# Patient Record
Sex: Female | Born: 1975 | Race: White | Hispanic: No | Marital: Married | Smoking: Former smoker
Health system: Southern US, Community
[De-identification: ages and names within clinical notes are randomized; demographics above are authoritative.]

## PROBLEM LIST (undated history)

## (undated) DIAGNOSIS — Z8489 Family history of other specified conditions: Secondary | ICD-10-CM

## (undated) DIAGNOSIS — D6851 Activated protein C resistance: Secondary | ICD-10-CM

## (undated) DIAGNOSIS — N83209 Unspecified ovarian cyst, unspecified side: Secondary | ICD-10-CM

## (undated) DIAGNOSIS — E785 Hyperlipidemia, unspecified: Secondary | ICD-10-CM

## (undated) DIAGNOSIS — K219 Gastro-esophageal reflux disease without esophagitis: Secondary | ICD-10-CM

## (undated) DIAGNOSIS — R112 Nausea with vomiting, unspecified: Secondary | ICD-10-CM

## (undated) DIAGNOSIS — J45909 Unspecified asthma, uncomplicated: Secondary | ICD-10-CM

## (undated) DIAGNOSIS — D689 Coagulation defect, unspecified: Secondary | ICD-10-CM

## (undated) DIAGNOSIS — D759 Disease of blood and blood-forming organs, unspecified: Secondary | ICD-10-CM

## (undated) DIAGNOSIS — J309 Allergic rhinitis, unspecified: Secondary | ICD-10-CM

## (undated) DIAGNOSIS — Z9889 Other specified postprocedural states: Secondary | ICD-10-CM

## (undated) HISTORY — DX: Gastro-esophageal reflux disease without esophagitis: K21.9

## (undated) HISTORY — DX: Unspecified ovarian cyst, unspecified side: N83.209

## (undated) HISTORY — DX: Hyperlipidemia, unspecified: E78.5

## (undated) HISTORY — PX: TONSILLECTOMY AND ADENOIDECTOMY: SUR1326

## (undated) HISTORY — DX: Activated protein C resistance: D68.51

## (undated) HISTORY — DX: Coagulation defect, unspecified: D68.9

## (undated) HISTORY — PX: WISDOM TOOTH EXTRACTION: SHX21

## (undated) HISTORY — PX: ABDOMINAL HYSTERECTOMY: SHX81

## (undated) HISTORY — DX: Allergic rhinitis, unspecified: J30.9

## (undated) HISTORY — DX: Unspecified asthma, uncomplicated: J45.909

---

## 2001-07-04 ENCOUNTER — Other Ambulatory Visit: Admission: RE | Admit: 2001-07-04 | Discharge: 2001-07-04 | Payer: Self-pay | Admitting: Obstetrics and Gynecology

## 2002-06-02 ENCOUNTER — Other Ambulatory Visit: Admission: RE | Admit: 2002-06-02 | Discharge: 2002-06-02 | Payer: Self-pay | Admitting: Obstetrics and Gynecology

## 2004-08-18 ENCOUNTER — Inpatient Hospital Stay (HOSPITAL_COMMUNITY): Admission: AD | Admit: 2004-08-18 | Discharge: 2004-08-18 | Payer: Self-pay | Admitting: Family Medicine

## 2004-08-29 ENCOUNTER — Other Ambulatory Visit: Admission: RE | Admit: 2004-08-29 | Discharge: 2004-08-29 | Payer: Self-pay | Admitting: Obstetrics and Gynecology

## 2005-06-03 ENCOUNTER — Ambulatory Visit: Payer: Self-pay | Admitting: Internal Medicine

## 2005-06-05 ENCOUNTER — Ambulatory Visit (HOSPITAL_COMMUNITY): Admission: RE | Admit: 2005-06-05 | Discharge: 2005-06-05 | Payer: Self-pay | Admitting: Internal Medicine

## 2005-10-13 ENCOUNTER — Ambulatory Visit: Payer: Self-pay | Admitting: Internal Medicine

## 2005-11-07 ENCOUNTER — Ambulatory Visit: Payer: Self-pay | Admitting: Internal Medicine

## 2006-08-30 ENCOUNTER — Ambulatory Visit: Payer: Self-pay | Admitting: Internal Medicine

## 2006-11-10 ENCOUNTER — Ambulatory Visit: Payer: Self-pay | Admitting: Internal Medicine

## 2007-10-26 ENCOUNTER — Ambulatory Visit: Payer: Self-pay | Admitting: Internal Medicine

## 2007-10-26 ENCOUNTER — Encounter (INDEPENDENT_AMBULATORY_CARE_PROVIDER_SITE_OTHER): Payer: Self-pay | Admitting: *Deleted

## 2007-10-26 DIAGNOSIS — J309 Allergic rhinitis, unspecified: Secondary | ICD-10-CM

## 2007-10-26 HISTORY — DX: Allergic rhinitis, unspecified: J30.9

## 2008-04-09 ENCOUNTER — Inpatient Hospital Stay (HOSPITAL_COMMUNITY): Admission: AD | Admit: 2008-04-09 | Discharge: 2008-04-09 | Payer: Self-pay | Admitting: Obstetrics and Gynecology

## 2008-04-11 ENCOUNTER — Observation Stay (HOSPITAL_COMMUNITY): Admission: AD | Admit: 2008-04-11 | Discharge: 2008-04-12 | Payer: Self-pay | Admitting: Obstetrics & Gynecology

## 2008-10-29 ENCOUNTER — Inpatient Hospital Stay (HOSPITAL_COMMUNITY): Admission: RE | Admit: 2008-10-29 | Discharge: 2008-10-31 | Payer: Self-pay | Admitting: Obstetrics & Gynecology

## 2009-06-24 ENCOUNTER — Ambulatory Visit: Payer: Self-pay | Admitting: Internal Medicine

## 2009-06-24 DIAGNOSIS — R5383 Other fatigue: Secondary | ICD-10-CM

## 2009-06-24 DIAGNOSIS — M79609 Pain in unspecified limb: Secondary | ICD-10-CM

## 2009-06-24 DIAGNOSIS — R5381 Other malaise: Secondary | ICD-10-CM | POA: Insufficient documentation

## 2009-06-24 DIAGNOSIS — R32 Unspecified urinary incontinence: Secondary | ICD-10-CM | POA: Insufficient documentation

## 2009-06-24 DIAGNOSIS — E785 Hyperlipidemia, unspecified: Secondary | ICD-10-CM | POA: Insufficient documentation

## 2009-06-24 HISTORY — DX: Hyperlipidemia, unspecified: E78.5

## 2009-06-25 ENCOUNTER — Encounter: Payer: Self-pay | Admitting: Internal Medicine

## 2009-06-25 LAB — CONVERTED CEMR LAB
ALT: 11 units/L (ref 0–35)
AST: 17 units/L (ref 0–37)
BUN: 9 mg/dL (ref 6–23)
Direct LDL: 160.4 mg/dL
Eosinophils Relative: 3 % (ref 0.0–5.0)
HCT: 42.7 % (ref 36.0–46.0)
HDL: 40.3 mg/dL (ref >39.00)
Ketones, ur: NEGATIVE mg/dL
Leukocytes, UA: NEGATIVE
MCHC: 34.6 g/dL (ref 30.0–36.0)
MCV: 89.1 fL (ref 78.0–100.0)
Monocytes Relative: 3.7 % (ref 3.0–12.0)
RDW: 11.9 % (ref 11.5–14.6)
Rhuematoid fact SerPl-aCnc: <20 intl units/mL (ref 0.0–20.0)
Sed Rate: 9 mm/hr (ref 0–22)
Sodium: 141 meq/L (ref 135–145)
Specific Gravity, Urine: 1.015 (ref 1.000–1.030)
TSH: 1 microintl units/mL (ref 0.35–5.50)
Total Bilirubin: 0.7 mg/dL (ref 0.3–1.2)
Total Protein, Urine: NEGATIVE mg/dL
Total Protein: 6.9 g/dL (ref 6.0–8.3)
Triglycerides: 165 mg/dL — ABNORMAL HIGH (ref 0.0–149.0)
Urine Glucose: NEGATIVE mg/dL
Urobilinogen, UA: 0.2 (ref 0.0–1.0)
WBC: 9.1 10*3/uL (ref 4.5–10.5)
pH: 7.5 (ref 5.0–8.0)

## 2009-06-26 LAB — CONVERTED CEMR LAB: Anti Nuclear Antibody(ANA): POSITIVE — AB

## 2009-07-02 ENCOUNTER — Encounter: Payer: Self-pay | Admitting: Internal Medicine

## 2009-08-15 ENCOUNTER — Ambulatory Visit: Payer: Self-pay | Admitting: Internal Medicine

## 2009-08-15 DIAGNOSIS — J019 Acute sinusitis, unspecified: Secondary | ICD-10-CM

## 2009-10-11 ENCOUNTER — Ambulatory Visit: Payer: Self-pay | Admitting: Internal Medicine

## 2009-10-11 DIAGNOSIS — M659 Synovitis and tenosynovitis, unspecified: Secondary | ICD-10-CM

## 2009-10-14 ENCOUNTER — Ambulatory Visit: Payer: Self-pay | Admitting: Internal Medicine

## 2009-10-24 LAB — CONVERTED CEMR LAB: Cholesterol: 218 mg/dL — ABNORMAL HIGH (ref 0–200)

## 2009-11-05 ENCOUNTER — Ambulatory Visit: Payer: Self-pay | Admitting: Internal Medicine

## 2010-01-06 ENCOUNTER — Ambulatory Visit: Payer: Self-pay | Admitting: Internal Medicine

## 2010-01-06 LAB — CONVERTED CEMR LAB
Albumin: 4 g/dL (ref 3.5–5.2)
Alkaline Phosphatase: 94 units/L (ref 39–117)
Bilirubin, Direct: 0.1 mg/dL (ref 0.0–0.3)
Cholesterol: 161 mg/dL (ref 0–200)
LDL Cholesterol: 67 mg/dL (ref 0–99)
Total CHOL/HDL Ratio: 3
VLDL: 34.4 mg/dL (ref 0.0–40.0)

## 2010-02-14 ENCOUNTER — Encounter: Payer: Self-pay | Admitting: Internal Medicine

## 2010-02-28 ENCOUNTER — Encounter (INDEPENDENT_AMBULATORY_CARE_PROVIDER_SITE_OTHER): Payer: Self-pay | Admitting: *Deleted

## 2010-02-28 ENCOUNTER — Ambulatory Visit: Payer: Self-pay | Admitting: Internal Medicine

## 2010-03-20 ENCOUNTER — Ambulatory Visit: Payer: Self-pay | Admitting: Internal Medicine

## 2010-04-10 ENCOUNTER — Ambulatory Visit: Payer: Self-pay | Admitting: Cardiology

## 2010-04-10 ENCOUNTER — Ambulatory Visit: Payer: Self-pay | Admitting: Internal Medicine

## 2010-04-10 DIAGNOSIS — J45909 Unspecified asthma, uncomplicated: Secondary | ICD-10-CM | POA: Insufficient documentation

## 2010-04-10 HISTORY — DX: Unspecified asthma, uncomplicated: J45.909

## 2010-04-11 ENCOUNTER — Encounter: Payer: Self-pay | Admitting: Internal Medicine

## 2010-04-11 ENCOUNTER — Telehealth: Payer: Self-pay | Admitting: Internal Medicine

## 2010-05-21 ENCOUNTER — Ambulatory Visit: Payer: Self-pay | Admitting: Internal Medicine

## 2010-05-21 DIAGNOSIS — R1011 Right upper quadrant pain: Secondary | ICD-10-CM

## 2010-05-22 LAB — CONVERTED CEMR LAB
ALT: 14 units/L (ref 0–35)
Alkaline Phosphatase: 85 units/L (ref 39–117)
BUN: 13 mg/dL (ref 6–23)
Basophils Absolute: 0.1 10*3/uL (ref 0.0–0.1)
Basophils Relative: 0.9 % (ref 0.0–3.0)
Bilirubin Urine: NEGATIVE
Bilirubin, Direct: 0.1 mg/dL (ref 0.0–0.3)
CO2: 28 meq/L (ref 19–32)
Calcium: 9.2 mg/dL (ref 8.4–10.5)
Eosinophils Absolute: 0.1 10*3/uL (ref 0.0–0.7)
Eosinophils Relative: 1.2 % (ref 0.0–5.0)
Glucose, Bld: 73 mg/dL (ref 70–99)
HCT: 39.6 % (ref 36.0–46.0)
Ketones, ur: NEGATIVE mg/dL
Leukocytes, UA: NEGATIVE
Lymphs Abs: 1.7 10*3/uL (ref 0.7–4.0)
MCHC: 34.9 g/dL (ref 30.0–36.0)
MCV: 88.6 fL (ref 78.0–100.0)
Monocytes Absolute: 0.6 10*3/uL (ref 0.1–1.0)
Neutro Abs: 6.7 10*3/uL (ref 1.4–7.7)
Neutrophils Relative %: 73.3 % (ref 43.0–77.0)
Platelets: 212 10*3/uL (ref 150.0–400.0)
RBC: 4.47 M/uL (ref 3.87–5.11)
RDW: 13.5 % (ref 11.5–14.6)
Sodium: 144 meq/L (ref 135–145)
Total Protein, Urine: NEGATIVE mg/dL
Urine Glucose: NEGATIVE mg/dL
Urobilinogen, UA: 0.2 (ref 0.0–1.0)

## 2010-05-27 ENCOUNTER — Encounter: Admission: RE | Admit: 2010-05-27 | Discharge: 2010-05-27 | Payer: Self-pay | Admitting: Internal Medicine

## 2010-05-30 ENCOUNTER — Telehealth: Payer: Self-pay | Admitting: Internal Medicine

## 2010-08-21 ENCOUNTER — Ambulatory Visit: Payer: Self-pay | Admitting: Internal Medicine

## 2010-11-17 ENCOUNTER — Ambulatory Visit: Payer: Self-pay | Admitting: Internal Medicine

## 2011-01-03 ENCOUNTER — Ambulatory Visit
Admission: RE | Admit: 2011-01-03 | Discharge: 2011-01-03 | Payer: Self-pay | Source: Home / Self Care | Attending: Family Medicine | Admitting: Family Medicine

## 2011-01-06 NOTE — Assessment & Plan Note (Signed)
Summary: SINUS INFECTION IS NOT CLEARED UP/NWS   Vital Signs:  Patient profile:   35 year old female Height:      69.5 inches Weight:      192 pounds BMI:     28.05 O2 Sat:      99 % on Room air Temp:     98.8 degrees F oral BP sitting:   108 / 72  (left arm) Cuff size:   regular  Vitals Entered ByZella Ball Ewing (March 20, 2010 4:21 PM)  O2 Flow:  Room air CC: sinus infection, right ear clogged/RE   Primary Care Provider:  Minus Breeding MD  CC:  sinus infection and right ear clogged/RE.  History of Present Illness: here after initially doing nicely with the avelox last visit, then in the past 2 to3  days has developed moderate to severe symptoms again of fever, facial pain, pressure, greenish d/c, and right earache, headache, malaise, general weakness;  has slight ST as well, but Pt denies CP, sob, doe, wheezing, orthopnea, pnd, worsening LE edema, palps, dizziness or syncope   Problems Prior to Update: 1)  Hepatotoxicity, Drug-induced, Risk of  (ICD-V58.69) 2)  Tenosynovitis of Foot and Ankle  (ICD-727.06) 3)  Sinusitis- Acute-nos  (ICD-461.9) 4)  Dyslipidemia  (ICD-272.4) 5)  Fatigue  (ICD-780.79) 6)  Urinary Incontinence  (ICD-788.30) 7)  Hand Pain, Bilateral  (ICD-729.5) 8)  Allergic Rhinitis  (ICD-477.9)  Medications Prior to Update: 1)  Ponstel 250 Mg Caps (Mefenamic Acid) .... Use Asd Per Gyn 2)  Ecotrin 325 Mg Tbec (Aspirin) .Marland Kitchen.. 1 By Mouth Once Daily As Needed 3)  Tramadol Hcl 50 Mg Tabs (Tramadol Hcl) .Marland Kitchen.. 1 - 2 By Mouth Q 6 Hrs As Needed Pain 4)  Vesicare 5 Mg Tabs (Solifenacin Succinate) .Marland Kitchen.. 1po Once Daily 5)  Simvastatin 40 Mg Tabs (Simvastatin) .Marland Kitchen.. 1po Once Daily 6)  Avelox 400 Mg Tabs (Moxifloxacin Hcl) .... Once Daily 7)  Allegra-D 12 Hour 60-120 Mg Xr12h-Tab (Fexofenadine-Pseudoephedrine) .... One By Mouth Two Times A Day As Needed For Nasal Cangestion  Current Medications (verified): 1)  Ponstel 250 Mg Caps (Mefenamic Acid) .... Use Asd Per Gyn 2)   Ecotrin 325 Mg Tbec (Aspirin) .Marland Kitchen.. 1 By Mouth Once Daily As Needed 3)  Tramadol Hcl 50 Mg Tabs (Tramadol Hcl) .Marland Kitchen.. 1 - 2 By Mouth Q 6 Hrs As Needed Pain 4)  Vesicare 5 Mg Tabs (Solifenacin Succinate) .Marland Kitchen.. 1po Once Daily 5)  Simvastatin 40 Mg Tabs (Simvastatin) .Marland Kitchen.. 1po Once Daily 6)  Clarithromycin 500 Mg Tabs (Clarithromycin) .Marland Kitchen.. 1 By Mouth Two Times A Day 7)  Allegra-D 12 Hour 60-120 Mg Xr12h-Tab (Fexofenadine-Pseudoephedrine) .... One By Mouth Two Times A Day As Needed For Nasal Cangestion  Allergies (verified): 1)  ! Penicillin  Past History:  Past Medical History: Last updated: 08/15/2009 Hypercholesterolemia  Ovary cyst Allergic rhinitis hx of factor V leiden mutation positive mild positive ANA  Past Surgical History: Last updated: 10/03/2007 T&A  Social History: Last updated: 11/05/2009 Former Smoker Alcohol use-yes - rare Husband has had a vasectomy Alcohol use-no Drug use-no Regular exercise-yes Occupation: school teacher  Risk Factors: Exercise: yes (11/05/2009)  Risk Factors: Smoking Status: quit (11/05/2009)  Review of Systems       all otherwise negative per pt -    Physical Exam  General:  alert and overweight-appearing.  , mild ill  Head:  normocephalic and atraumatic.   Eyes:  vision grossly intact, pupils equal, and pupils round.   Ears:  right TM mod erythema, left TM no erythema, canals ok, sinus tender bilat Nose:  nasal dischargemucosal pallor and mucosal edema.   Mouth:  pharyngeal erythema and fair dentition.   Neck:  supple and no masses.   Lungs:  normal respiratory effort and normal breath sounds.   Heart:  normal rate and regular rhythm.   Extremities:  no edema, no erythema    Impression & Recommendations:  Problem # 1:  SINUSITIS- ACUTE-NOS (ICD-461.9)  Her updated medication list for this problem includes:    Clarithromycin 500 Mg Tabs (Clarithromycin) .Marland Kitchen... 1 by mouth two times a day    Allegra-d 12 Hour 60-120 Mg  Xr12h-tab (Fexofenadine-pseudoephedrine) ..... One by mouth two times a day as needed for nasal cangestion treat as above, f/u any worsening signs or symptoms , consider sinus CT for worsening or persistent symtpoms  Problem # 2:  ALLERGIC RHINITIS (ICD-477.9) not symptomaitc at this time  Complete Medication List: 1)  Ponstel 250 Mg Caps (Mefenamic acid) .... Use asd per gyn 2)  Ecotrin 325 Mg Tbec (Aspirin) .Marland Kitchen.. 1 by mouth once daily as needed 3)  Tramadol Hcl 50 Mg Tabs (Tramadol hcl) .Marland Kitchen.. 1 - 2 by mouth q 6 hrs as needed pain 4)  Vesicare 5 Mg Tabs (Solifenacin succinate) .Marland Kitchen.. 1po once daily 5)  Simvastatin 40 Mg Tabs (Simvastatin) .Marland Kitchen.. 1po once daily 6)  Clarithromycin 500 Mg Tabs (Clarithromycin) .Marland Kitchen.. 1 by mouth two times a day 7)  Allegra-d 12 Hour 60-120 Mg Xr12h-tab (Fexofenadine-pseudoephedrine) .... One by mouth two times a day as needed for nasal cangestion  Patient Instructions: 1)  Please take all new medications as prescribed 2)  Continue all previous medications as before this visit  3)  You can also use Mucinex OTC or it's generic for congestion  4)  Please schedule a follow-up appointment as needed. Prescriptions: CLARITHROMYCIN 500 MG TABS (CLARITHROMYCIN) 1 by mouth two times a day  #20 x 0   Entered and Authorized by:   Corwin Levins MD   Signed by:   Corwin Levins MD on 03/20/2010   Method used:   Print then Give to Patient   RxID:   [id number]

## 2011-01-06 NOTE — Assessment & Plan Note (Signed)
Summary: PINK EYE AND SINUS INFECTION/NWS   Vital Signs:  Patient profile:   35 year old female Height:      68.5 inches Weight:      193.13 pounds BMI:     29.04 O2 Sat:      97 % on Room air Temp:     98.7 degrees F oral Pulse rate:   85 / minute BP sitting:   114 / 70  (left arm) Cuff size:   regular  Vitals Entered By: Zella Ball Ewing CMA Duncan Dull) (August 21, 2010 3:48 PM)  O2 Flow:  Room air CC: Sinus infection, Right eye drainage/RE   Primary Care Provider:  Minus Breeding MD  CC:  Sinus infection and Right eye drainage/RE.  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  Has had right eye watery to mild cloudy d/c in the past few days as well with mild itching.  Mucinex D has been helping the sinus symptoms in the past wk but cant take at night as it disturbs the sleep , then next am is even worse pain and pressure.   Overall good compliacne with meds, good tolerabiltiy.  No wheezing or night time awakenings.    Problems Prior to Update: 1)  Abdominal Pain Right Upper Quadrant  (ICD-789.01) 2)  Asthma  (ICD-493.90) 3)  Hepatotoxicity, Drug-induced, Risk of  (ICD-V58.69) 4)  Tenosynovitis of Foot and Ankle  (ICD-727.06) 5)  Sinusitis- Acute-nos  (ICD-461.9) 6)  Dyslipidemia  (ICD-272.4) 7)  Fatigue  (ICD-780.79) 8)  Urinary Incontinence  (ICD-788.30) 9)  Hand Pain, Bilateral  (ICD-729.5) 10)  Allergic Rhinitis  (ICD-477.9)  Medications Prior to Update: 1)  Ponstel 250 Mg  Caps (Mefenamic Acid) .... Use Asd Per Gyn 2)  Ecotrin 325 Mg Tbec (Aspirin) .Marland Kitchen.. 1 By Mouth Once Daily As Needed 3)  Tramadol Hcl 50 Mg Tabs (Tramadol Hcl) .Marland Kitchen.. 1 - 2 By Mouth Q 6 Hrs As Needed Pain 4)  Vesicare 5 Mg Tabs (Solifenacin Succinate) .Marland Kitchen.. 1po Once Daily 5)  Simvastatin 40 Mg Tabs (Simvastatin) .Marland Kitchen.. 1po Once Daily 6)  Doxycycline Hyclate 100 Mg Caps (Doxycycline Hyclate) .Marland Kitchen.. 1po Two Times A Day 7)  Singulair 10 Mg Tabs (Montelukast Sodium) .Marland Kitchen.. 1po Once Daily 8)  Prednisone 10 Mg Tabs (Prednisone) .... 3po Qd For 3days, Then 2po Qd For 3days, Then 1po Qd For 3days, Then Stop  Current Medications (verified): 1)  Ponstel 250 Mg Caps (Mefenamic Acid) .... Use Asd Per Gyn 2)  Ecotrin 325 Mg Tbec (Aspirin) .Marland Kitchen.. 1 By Mouth Once Daily As Needed 3)  Tramadol Hcl 50 Mg Tabs (Tramadol Hcl) .Marland Kitchen.. 1 - 2 By Mouth Q 6 Hrs As Needed Pain 4)  Vesicare 5 Mg Tabs (Solifenacin Succinate) .Marland Kitchen.. 1po Once Daily 5)  Simvastatin 40 Mg Tabs (Simvastatin) .Marland Kitchen.. 1po Once Daily 6)  Doxycycline Hyclate 100 Mg Caps (Doxycycline Hyclate) .Marland Kitchen.. 1po Two Times A Day 7)  Singulair 10 Mg Tabs (Montelukast Sodium) .Marland Kitchen.. 1po Once Daily 8)  Mucinex 600 Mg  Xr12h-Tab (Guaifenesin) .Marland Kitchen.. 1po Two Times A Day As Needed 9)  Zaditor 0.025 % Soln (Ketotifen Fumarate) .... Use Asd  Allergies (verified): 1)  ! Penicillin 2)  Biaxin  Past History:  Past Medical History: Last updated: 04/10/2010 Hypercholesterolemia  Ovary cyst Allergic rhinitis hx of factor V leiden mutation positive mild positive ANA Asthma  Past Surgical History: Last updated: 10/03/2007 T&A  Social History: Last updated: 11/05/2009 Former Smoker Alcohol use-yes - rare Husband has had a vasectomy Alcohol use-no Drug use-no Regular exercise-yes Occupation: school teacher  Risk Factors: Exercise: yes (11/05/2009)  Risk Factors: Smoking Status: quit (11/05/2009)  Review of Systems       all otherwise negative per pt -    Physical  Exam  General:  alert and overweight-appearing. , mild ill  Head:  normocephalic and atraumatic.   Eyes:  vision grossly intact, pupils equal, and pupils round, has mild right conjunctival erythema and mild d/c.   Ears:  bilat tm's red, sinus tender bilat Nose:  nasal dischargemucosal pallor and mucosal edema.   Mouth:  pharyngeal erythema and fair dentition.   Neck:  supple and cervical lymphadenopathy.   Lungs:  normal respiratory effort and normal breath sounds.   Heart:  normal rate and regular rhythm.   Extremities:  no edema, no erythema    Impression & Recommendations:  Problem # 1:  SINUSITIS- ACUTE-NOS (ICD-461.9)  Her updated medication list for this problem includes:    Doxycycline Hyclate 100 Mg Caps (Doxycycline hyclate) .Marland Kitchen... 1po two times a day    Mucinex 600 Mg Xr12h-tab (Guaifenesin) .Marland Kitchen... 1po two times a day as needed treat as above, f/u any worsening signs or symptoms   Problem # 2:  ALLERGIC RHINITIS (ICD-477.9) with mild conjucntivitis as well - for otc zaditor, and mucinex d in the am as needed, as mucinex plain in the pm as needed   Problem # 3:  ASTHMA (ICD-493.90)  The following medications were removed from the medication list:    Prednisone 10 Mg Tabs (Prednisone) .Marland Kitchen... 3po qd for 3days, then 2po qd for 3days, then 1po qd for 3days, then stop Her updated medication list for this problem includes:    Singulair 10 Mg Tabs (Montelukast sodium) .Marland Kitchen... 1po once daily stable overall by hx and exam, ok to continue meds/tx as is   Complete Medication List: 1)  Ponstel 250 Mg Caps (Mefenamic acid) .... Use asd per gyn 2)  Ecotrin 325 Mg Tbec (Aspirin) .Marland Kitchen.. 1 by mouth once daily as needed 3)  Tramadol Hcl 50 Mg Tabs (Tramadol hcl) .Marland Kitchen.. 1 - 2 by mouth q 6 hrs as needed pain 4)  Vesicare 5 Mg Tabs (Solifenacin succinate) .Marland Kitchen.. 1po once daily 5)  Simvastatin 40 Mg Tabs (Simvastatin) .Marland Kitchen.. 1po once daily 6)  Doxycycline Hyclate 100 Mg Caps (Doxycycline hyclate) .Marland Kitchen..  1po two times a day 7)  Singulair 10 Mg Tabs (Montelukast sodium) .Marland Kitchen.. 1po once daily 8)  Mucinex 600 Mg Xr12h-tab (Guaifenesin) .Marland Kitchen.. 1po two times a day as needed 9)  Zaditor 0.025 % Soln (Ketotifen fumarate) .... Use asd  Other Orders: Admin 1st Vaccine (54098) Flu Vaccine 11yrs + [phone])  Patient Instructions: 1)  Please take all new medications as prescribed 2)  Continue all previous medications as before this visit  3)  You can also use Mucinex OTC or it's generic for congestion , and the Zaditor for the eyes as needed  4)  you had the flu shot today 5)  Please schedule  a follow-up appointment as needed. Prescriptions: ZADITOR 0.025 % SOLN (KETOTIFEN FUMARATE) use asd  #1 x 11   Entered and Authorized by:   Corwin Levins MD   Signed by:   Corwin Levins MD on 08/21/2010   Method used:   Print then Give to Patient   RxID:   [id number] MUCINEX 600 MG XR12H-TAB (GUAIFENESIN) 1po two times a day as needed  #60 x 2   Entered and Authorized by:   Corwin Levins MD   Signed by:   Corwin Levins MD on 08/21/2010   Method used:   Print then Give to Patient   RxID:   [id number] QIONGEX D 60-600 MG XR12H-TAB (PSEUDOEPHEDRINE-GUAIFENESIN) 1 by mouth two times a day as needed  #60 x 2   Entered and Authorized by:   Corwin Levins MD   Signed by:   Corwin Levins MD on 08/21/2010   Method used:   Print then Give to Patient   RxID:   [id number] DOXYCYCLINE HYCLATE 100 MG CAPS (DOXYCYCLINE HYCLATE) 1po two times a day  #20 x 0   Entered and Authorized by:   Corwin Levins MD   Signed by:   Corwin Levins MD on 08/21/2010   Method used:   Print then Give to Patient   RxID:   [id number]    Flu Vaccine Consent Questions     Do you have a history of severe allergic reactions to this vaccine? no    Any prior history of allergic reactions to egg and/or gelatin? no    Do you have a sensitivity to the preservative Thimersol? no    Do you have a past history of Guillan-Barre Syndrome?  no    Do you currently have an acute febrile illness? no    Have you ever had a severe reaction to latex? no    Vaccine information given and explained to patient? yes    Are you currently pregnant? no    Lot Number:[id number]   Exp Date:06/06/2011   Site Given  Left Deltoid IMflu

## 2011-01-06 NOTE — Medication Information (Signed)
Summary: Singulair Approved/[facility]  Singulair Approved/[facility]   Imported By: Sherian Rein 04/18/2010 08:15:51  _____________________________________________________________________  External Attachment:    Type:   Image     Comment:   External Document

## 2011-01-06 NOTE — Letter (Signed)
Summary: Out of Work  [facility]  [address] [place], [place] [address]   Phone: [phone]  Fax: [phone]    February 28, 2010   Employee:  Luretha MURPHY-JUSTICE    To Whom It May Concern:   For Medical reasons, please excuse the above named employee from work for the following dates:  Start:   02/28/2010  End:   02/28/2010  If you need additional information, please feel free to contact our office.         Sincerely,    Dr. Oliver Barre

## 2011-01-06 NOTE — Progress Notes (Signed)
Summary: Singulair PA  Phone Note From Pharmacy   Details of Request: [employer] Summary of Call: PA request--Singulair. PA was available online, completed, and approved until 2012. Initial call taken by: Lucious Groves,  Apr 11, 2010 8:39 AM  Follow-up for Phone Call        noted, thanks Follow-up by: Corwin Levins MD,  Apr 11, 2010 11:22 AM

## 2011-01-06 NOTE — Assessment & Plan Note (Signed)
Summary: STOMACH PAIN SINCE THURS/NWS   Vital Signs:  Patient profile:   35 year old female Height:      68.5 inches Weight:      187 pounds BMI:     28.12 O2 Sat:      97 % on Room air Temp:     97.7 degrees F oral Pulse rate:   92 / minute BP sitting:   112 / 82  (left arm) Cuff size:   regular  Vitals Entered ByZella Ball Ewing (May 21, 2010 4:40 PM)  O2 Flow:  Room air CC: Stomach Pain/RE   Primary Care Provider:  Minus Breeding MD  CC:  Stomach Pain/RE.  History of Present Illness: here with upper abd pain - ruq and epigastric, seemed to start last thur, constant and gradually worsening with peak mon june 13, the somewhat improved again in the last 2 days, moderate now, sharp , sometimes crampy, worse to belching, eating, adn deep breathing; without n/v, blood, fever, night sweats or radiation to the back, or GU symptoms or lower abd pain.  Similar episode 1 mo ago only last 2 days and resolved.  Did have hard stool constipation on Monday as well with straining, better since monday with more normal BM without straining.  Not taking the zocor currently so does not believe that could be a factor.  Lost 5 lbs since last vist, as she has tried to stick with a bland diet.   No prior colonoscopy or egd. Pepcid OTC no help.  Problems Prior to Update: 1)  Abdominal Pain Right Upper Quadrant  (ICD-789.01) 2)  Asthma  (ICD-493.90) 3)  Hepatotoxicity, Drug-induced, Risk of  (ICD-V58.69) 4)  Tenosynovitis of Foot and Ankle  (ICD-727.06) 5)  Sinusitis- Acute-nos  (ICD-461.9) 6)  Dyslipidemia  (ICD-272.4) 7)  Fatigue  (ICD-780.79) 8)  Urinary Incontinence  (ICD-788.30) 9)  Hand Pain, Bilateral  (ICD-729.5) 10)  Allergic Rhinitis  (ICD-477.9)  Medications Prior to Update: 1)  Ponstel 250 Mg Caps (Mefenamic Acid) .... Use Asd Per Gyn 2)  Ecotrin 325 Mg Tbec (Aspirin) .Marland Kitchen.. 1 By Mouth Once Daily As Needed 3)  Tramadol Hcl 50 Mg Tabs (Tramadol Hcl) .Marland Kitchen.. 1 - 2 By Mouth Q 6 Hrs As Needed  Pain 4)  Vesicare 5 Mg Tabs (Solifenacin Succinate) .Marland Kitchen.. 1po Once Daily 5)  Simvastatin 40 Mg Tabs (Simvastatin) .Marland Kitchen.. 1po Once Daily 6)  Doxycycline Hyclate 100 Mg Caps (Doxycycline Hyclate) .Marland Kitchen.. 1po Two Times A Day 7)  Singulair 10 Mg Tabs (Montelukast Sodium) .Marland Kitchen.. 1po Once Daily 8)  Prednisone 10 Mg Tabs (Prednisone) .... 3po Qd For 3days, Then 2po Qd For 3days, Then 1po Qd For 3days, Then Stop  Current Medications (verified): 1)  Ponstel 250 Mg Caps (Mefenamic Acid) .... Use Asd Per Gyn 2)  Ecotrin 325 Mg Tbec (Aspirin) .Marland Kitchen.. 1 By Mouth Once Daily As Needed 3)  Tramadol Hcl 50 Mg Tabs (Tramadol Hcl) .Marland Kitchen.. 1 - 2 By Mouth Q 6 Hrs As Needed Pain 4)  Vesicare 5 Mg Tabs (Solifenacin Succinate) .Marland Kitchen.. 1po Once Daily 5)  Simvastatin 40 Mg Tabs (Simvastatin) .Marland Kitchen.. 1po Once Daily 6)  Doxycycline Hyclate 100 Mg Caps (Doxycycline Hyclate) .Marland Kitchen.. 1po Two Times A Day 7)  Singulair 10 Mg Tabs (Montelukast Sodium) .Marland Kitchen.. 1po Once Daily 8)  Prednisone 10 Mg Tabs (Prednisone) .... 3po Qd For 3days, Then 2po Qd For 3days, Then 1po Qd For 3days, Then Stop  Allergies (verified): 1)  ! Penicillin 2)  Biaxin  Past History:  Past Medical History: Last updated: 04/10/2010 Hypercholesterolemia  Ovary cyst Allergic rhinitis hx of factor V leiden mutation positive mild positive ANA Asthma  Past Surgical History: Last updated: 10/03/2007 T&A  Social History: Last updated: 11/05/2009 Former Smoker Alcohol use-yes - rare Husband has had a vasectomy Alcohol use-no Drug use-no Regular exercise-yes Occupation: school teacher  Risk Factors: Exercise: yes (11/05/2009)  Risk Factors: Smoking Status: quit (11/05/2009)  Review of Systems       all otherwise negative per pt -    Physical Exam  General:  alert and overweight-appearing.  , mild uncomfortable appearing but non toxic/ill Head:  normocephalic and atraumatic.   Eyes:  vision grossly intact, pupils equal, and pupils round.   Ears:  R ear  normal and L ear normal.   Nose:  no external deformity and no nasal discharge.   Mouth:  no gingival abnormalities and pharynx pink and moist.   Neck:  supple and no masses.   Lungs:  normal respiratory effort and normal breath sounds.   Heart:  normal rate and regular rhythm.   Abdomen:  soft and normal bowel sounds.but tender ruq and epigastric, without guarding or rebound   Msk:  no joint tenderness and no joint swelling.   Extremities:  no edema, no erythema  Skin:  color normal and no rashes.     Impression & Recommendations:  Problem # 1:  ABDOMINAL PAIN RIGHT UPPER QUADRANT (ICD-789.01)  and mid epigastric, OTC pepcid no help at all;  ? GB vs constipation - to check labs today, and GB u/s - to surg pending u/s results  Orders: TLB-BMP (Basic Metabolic Panel-BMET) (80048-METABOL) TLB-CBC Platelet - w/Differential (85025-CBCD) TLB-Hepatic/Liver Function Pnl (80076-HEPATIC) TLB-Lipase (83690-LIPASE) TLB-Udip ONLY (81003-UDIP) Radiology Referral (Radiology)  Complete Medication List: 1)  Ponstel 250 Mg Caps (Mefenamic acid) .... Use asd per gyn 2)  Ecotrin 325 Mg Tbec (Aspirin) .Marland Kitchen.. 1 by mouth once daily as needed 3)  Tramadol Hcl 50 Mg Tabs (Tramadol hcl) .Marland Kitchen.. 1 - 2 by mouth q 6 hrs as needed pain 4)  Vesicare 5 Mg Tabs (Solifenacin succinate) .Marland Kitchen.. 1po once daily 5)  Simvastatin 40 Mg Tabs (Simvastatin) .Marland Kitchen.. 1po once daily 6)  Doxycycline Hyclate 100 Mg Caps (Doxycycline hyclate) .Marland Kitchen.. 1po two times a day 7)  Singulair 10 Mg Tabs (Montelukast sodium) .Marland Kitchen.. 1po once daily 8)  Prednisone 10 Mg Tabs (Prednisone) .... 3po qd for 3days, then 2po qd for 3days, then 1po qd for 3days, then stop  Patient Instructions: 1)  Please go to the Lab in the basement for your blood and/or urine tests today 2)  Continue all previous medications as before this visit  3)  You will be contacted about the referral(s) to: Ultrasound 4)  Please schedule a follow-up appointment as needed.

## 2011-01-06 NOTE — Progress Notes (Signed)
----   Converted from flag ---- ---- 05/30/2010 1:19 PM, Corwin Levins MD wrote: ok to tell pt u/s is neg;   if still having pain I can refer to GI  robin to call pt to inform;  and convert this to phone note  ---- 05/30/2010 10:44 AM, Verdell Face wrote: Hi Dr Jonny Ruiz;  Katiya Murphy-Justice [phone] her [place] report is not on PT but you put a note that you put it on PT 6/21. Pt would like results. Can Zella Ball call results to her maybe?  Cheryl ------------------------------ called patient informed that u/s neg. Patient does not want to be referred at this time as pain is gone, but if pain comes back would want a referral at that time. 6/24/.2011 Zella Ball

## 2011-01-06 NOTE — Assessment & Plan Note (Signed)
Summary: SINUS PROBLEM  STC   Vital Signs:  Patient profile:   35 year old female Height:      69 inches Weight:      190 pounds BMI:     28.16 O2 Sat:      98 % on Room air Temp:     98.3 degrees F oral Pulse rate:   103 / minute BP sitting:   112 / 82  (left arm) Cuff size:   regular  Vitals Entered ByZella Ball Ewing (January 06, 2010 2:08 PM)  O2 Flow:  Room air CC: sinus pressure,sore throat, dizzy,cough/RE   Primary Care Provider:  Minus Breeding MD  CC:  sinus pressure, sore throat, dizzy, and cough/RE.  History of Present Illness: here with acute onset facial pain, pressure, fever and greenish d/c for 3days, mild to mod, with feeling cold sometime; has some slight St and non prod cough but Pt denies CP, sob, doe, wheezing, orthopnea, pnd, worsening LE edema, palps, dizziness or syncope  Also to date due to weather has not been able to get her new statin lab f/u - overall tolerating well without myalgias or other symptom (cns or GI) and would like lipids today.  Also mentions specificaly hand pain from last yr the same, and no worsening.  No new myalgias or arthralgias or synovitis.    Problems Prior to Update: 1)  Tenosynovitis of Foot and Ankle  (ICD-727.06) 2)  Sinusitis- Acute-nos  (ICD-461.9) 3)  Dyslipidemia  (ICD-272.4) 4)  Fatigue  (ICD-780.79) 5)  Urinary Incontinence  (ICD-788.30) 6)  Hand Pain, Bilateral  (ICD-729.5) 7)  Allergic Rhinitis  (ICD-477.9)  Medications Prior to Update: 1)  Ponstel 250 Mg Caps (Mefenamic Acid) .... Use Asd Per Gyn 2)  Ecotrin 325 Mg Tbec (Aspirin) .Marland Kitchen.. 1 By Mouth Once Daily As Needed 3)  Tramadol Hcl 50 Mg Tabs (Tramadol Hcl) .Marland Kitchen.. 1 - 2 By Mouth Q 6 Hrs As Needed Pain 4)  Vesicare 5 Mg Tabs (Solifenacin Succinate) .Marland Kitchen.. 1po Once Daily 5)  Simvastatin 40 Mg Tabs (Simvastatin) .Marland Kitchen.. 1po Once Daily 6)  Avelox 400 Mg Tabs (Moxifloxacin Hcl) .... Once Daily For 7 Days 7)  Allegra-D 12 Hour 60-120 Mg Xr12h-Tab  (Fexofenadine-Pseudoephedrine) .... One By Mouth Two Times A Day As Needed For Nasal Cangestion  Current Medications (verified): 1)  Ponstel 250 Mg Caps (Mefenamic Acid) .... Use Asd Per Gyn 2)  Ecotrin 325 Mg Tbec (Aspirin) .Marland Kitchen.. 1 By Mouth Once Daily As Needed 3)  Tramadol Hcl 50 Mg Tabs (Tramadol Hcl) .Marland Kitchen.. 1 - 2 By Mouth Q 6 Hrs As Needed Pain 4)  Vesicare 5 Mg Tabs (Solifenacin Succinate) .Marland Kitchen.. 1po Once Daily 5)  Simvastatin 40 Mg Tabs (Simvastatin) .Marland Kitchen.. 1po Once Daily 6)  Avelox 400 Mg Tabs (Moxifloxacin Hcl) .... Once Daily 7)  Allegra-D 12 Hour 60-120 Mg Xr12h-Tab (Fexofenadine-Pseudoephedrine) .... One By Mouth Two Times A Day As Needed For Nasal Cangestion  Allergies (verified): 1)  ! 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  , mild ill  Head:  normocephalic and atraumatic.   Eyes:  vision grossly intact, pupils equal, and pupils round.   Ears:  left tm severe red, sinus tender bilat max areas, right tm mild erythema only Nose:  nasal dischargemucosal pallor and mucosal erythema.   Mouth:  pharyngeal erythema and fair dentition.   Neck:  supple and cervical lymphadenopathy.   Lungs:  normal respiratory effort and normal breath sounds.   Heart:  normal rate and regular rhythm.   Msk:  no joint tenderness and no joint swelling.  , hands or otherwise Extremities:  no edema, no  erythema    Impression & Recommendations:  Problem # 1:  SINUSITIS- ACUTE-NOS (ICD-461.9)  Her updated medication list for this problem includes:    Avelox 400 Mg Tabs (Moxifloxacin hcl) ..... Once daily    Allegra-d 12 Hour 60-120 Mg Xr12h-tab (Fexofenadine-pseudoephedrine) ..... One by mouth two times a day as needed for nasal cangestion  treat as above, f/u any worsening signs or symptoms   Problem # 2:  DYSLIPIDEMIA (ICD-272.4)  Her updated medication list for this problem includes:    Simvastatin 40 Mg Tabs (Simvastatin) .Marland Kitchen... 1po once daily  Orders: TLB-Lipid Panel (80061-LIPID) for lipid f/u - treat as above, f/u any worsening signs or symptoms , Pt to continue diet efforts, good med tolerance; to check labs - goal LDL less than 70   Problem # 3:  HAND PAIN, BILATERAL (ICD-729.5) no change recent, cont to monitor; note ANA pos july 2010  Problem # 4:  ALLERGIC RHINITIS (ICD-477.9) o/w stable, to cont clairitn otc as needed   Complete Medication List: 1)  Ponstel 250 Mg Caps (Mefenamic acid) .... Use asd per gyn 2)  Ecotrin 325 Mg Tbec (Aspirin) .Marland Kitchen.. 1 by mouth once daily as needed 3)  Tramadol Hcl 50 Mg Tabs (Tramadol hcl) .Marland Kitchen.. 1 - 2 by mouth q 6 hrs as needed pain 4)  Vesicare 5 Mg Tabs (Solifenacin succinate) .Marland Kitchen.. 1po once daily 5)  Simvastatin 40 Mg Tabs (Simvastatin) .Marland Kitchen.. 1po once daily 6)  Avelox 400 Mg Tabs (Moxifloxacin hcl) .... Once daily 7)  Allegra-d 12 Hour 60-120 Mg Xr12h-tab (Fexofenadine-pseudoephedrine) .... One by mouth two times a day as needed for nasal cangestion  Other Orders: TLB-Hepatic/Liver Function Pnl (80076-HEPATIC)  Patient Instructions: 1)  Please take all new medications as prescribed 2)  Continue all previous medications as before this visit  3)  Please go to the Lab in the basement for your blood tests today  4)  Please schedule a follow-up appointment in 1 year or sooner if needed Prescriptions: AVELOX 400 MG TABS (MOXIFLOXACIN  HCL) once daily  #10 x 0   Entered and Authorized by:   Corwin Levins MD   Signed by:   Corwin Levins MD on 01/06/2010   Method used:   Electronically to        [facility] [phone]* (retail)       [address]       [place], [place]  [address]       Ph: [phone] or [phone]       Fax: [phone]   RxID:   [id number]

## 2011-01-06 NOTE — Letter (Signed)
[facility]   Imported By: Sherian Rein 02/27/2010 09:24:48  _____________________________________________________________________  External Attachment:    Type:   Image     Comment:   External Document

## 2011-01-06 NOTE — Assessment & Plan Note (Signed)
Summary: SINUS PROBLEM  STC   Vital Signs:  Patient profile:   35 year old female Height:      69.5 inches Weight:      190 pounds BMI:     27.76 O2 Sat:      98 % on Room air Temp:     98.6 degrees F oral Pulse rate:   109 / minute BP sitting:   112 / 80  (left arm) Cuff size:   regular  Vitals Entered ByZella Ball Ewing (Apr 10, 2010 3:10 PM)  O2 Flow:  Room air CC: sinus congestion, ear pain, light headed, cough/RE   Primary Care Provider:  Minus Breeding MD  CC:  sinus congestion, ear pain, light headed, and cough/RE.  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  This time also with mild wheezing and chest tightness, hard to take deep breaths. Mentions also gi upset with the biaxin and would prefer not to take this again.    Problems Prior to Update: 1)  Asthma  (ICD-493.90) 2)  Hepatotoxicity, Drug-induced, Risk of  (ICD-V58.69) 3)  Tenosynovitis of Foot and Ankle  (ICD-727.06) 4)  Sinusitis- Acute-nos  (ICD-461.9) 5)  Dyslipidemia  (ICD-272.4) 6)  Fatigue  (ICD-780.79) 7)  Urinary Incontinence  (ICD-788.30) 8)  Hand Pain, Bilateral  (ICD-729.5) 9)  Allergic Rhinitis  (ICD-477.9)  Medications Prior to Update: 1)  Ponstel 250 Mg Caps (Mefenamic Acid) .... Use Asd Per Gyn 2)  Ecotrin 325 Mg Tbec (Aspirin) .Marland Kitchen.. 1 By Mouth Once Daily As Needed 3)  Tramadol Hcl 50 Mg Tabs (Tramadol Hcl) .Marland Kitchen.. 1 - 2 By Mouth Q 6 Hrs As Needed Pain 4)  Vesicare 5 Mg Tabs (Solifenacin Succinate) .Marland Kitchen.. 1po Once Daily 5)  Simvastatin 40 Mg Tabs (Simvastatin) .Marland Kitchen.. 1po Once Daily 6)  Clarithromycin 500 Mg Tabs (Clarithromycin) .Marland Kitchen.. 1 By Mouth Two Times A Day 7)  Allegra-D 12 Hour 60-120 Mg Xr12h-Tab  (Fexofenadine-Pseudoephedrine) .... One By Mouth Two Times A Day As Needed For Nasal Cangestion  Current Medications (verified): 1)  Ponstel 250 Mg Caps (Mefenamic Acid) .... Use Asd Per Gyn 2)  Ecotrin 325 Mg Tbec (Aspirin) .Marland Kitchen.. 1 By Mouth Once Daily As Needed 3)  Tramadol Hcl 50 Mg Tabs (Tramadol Hcl) .Marland Kitchen.. 1 - 2 By Mouth Q 6 Hrs As Needed Pain 4)  Vesicare 5 Mg Tabs (Solifenacin Succinate) .Marland Kitchen.. 1po Once Daily 5)  Simvastatin 40 Mg Tabs (Simvastatin) .Marland Kitchen.. 1po Once Daily 6)  Doxycycline Hyclate 100 Mg Caps (Doxycycline Hyclate) .Marland Kitchen.. 1po Two Times A Day 7)  Singulair 10 Mg Tabs (Montelukast Sodium) .Marland Kitchen.. 1po Once Daily 8)  Prednisone 10 Mg Tabs (Prednisone) .... 3po Qd For 3days, Then 2po Qd For 3days, Then 1po Qd For 3days, Then Stop  Allergies (verified): 1)  ! Penicillin 2)  Biaxin  Past History:  Past Surgical History: Last updated: 10/03/2007 T&A  Social History: Last updated: 11/05/2009 Former Smoker Alcohol use-yes - rare Husband has had a vasectomy Alcohol use-no Drug use-no Regular exercise-yes Occupation: school teacher  Risk Factors: Exercise: yes (11/05/2009)  Risk Factors: Smoking Status: quit (11/05/2009)  Past Medical History: Hypercholesterolemia  Ovary cyst Allergic rhinitis hx  of factor V leiden mutation positive mild positive ANA Asthma  Review of Systems       all otherwise negative per pt -    Physical Exam  General:  alert and overweight-appearing.  , mild ill appearing Head:  normocephalic and atraumatic.   Eyes:  vision grossly intact, pupils equal, and pupils round.   Ears:  bilat tm's mild erythema, no bulging, canals clear, sinus tender bilat Nose:  nasal dischargemucosal pallor and mucosal edema.   Mouth:  pharyngeal erythema and fair dentition.   Neck:  supple and no masses.   Lungs:  normal respiratory effort, R decreased breath sounds, R wheezes, L decreased breath sounds, and L wheezes.   Heart:  normal rate and regular rhythm.    Extremities:  no edema, no erythema    Impression & Recommendations:  Problem # 1:  SINUSITIS- ACUTE-NOS (ICD-461.9)  Her updated medication list for this problem includes:    Doxycycline Hyclate 100 Mg Caps (Doxycycline hyclate) .Marland Kitchen... 1po two times a day  Orders: Misc. Referral (Misc. Ref)  third recent episode, with also ongoing allergy, and now   Problem # 2:  ASTHMA (ICD-493.90)  Her updated medication list for this problem includes:    Singulair 10 Mg Tabs (Montelukast sodium) .Marland Kitchen... 1po once daily    Prednisone 10 Mg Tabs (Prednisone) .Marland Kitchen... 3po qd for 3days, then 2po qd for 3days, then 1po qd for 3days, then stop with mild wheezing today - for depo shot, and prednisone taper off, add singulair 10 mg per day  Orders: Depo- Medrol 40mg  (J1030) Depo- Medrol 80mg  (J1040) Admin of Therapeutic Inj  intramuscular or subcutaneous (52841)  Problem # 3:  ALLERGIC RHINITIS (ICD-477.9) for singulair as above, ok for sudafed otc as needed as well for now; consider allergy referral  Complete Medication List: 1)  Ponstel 250 Mg Caps (Mefenamic acid) .... Use asd per gyn 2)  Ecotrin 325 Mg Tbec (Aspirin) .Marland Kitchen.. 1 by mouth once daily as needed 3)  Tramadol Hcl 50 Mg Tabs (Tramadol hcl) .Marland Kitchen.. 1 - 2 by mouth q 6 hrs as needed pain 4)  Vesicare 5 Mg Tabs (Solifenacin succinate) .Marland Kitchen.. 1po once daily 5)  Simvastatin 40 Mg Tabs (Simvastatin) .Marland Kitchen.. 1po once daily 6)  Doxycycline Hyclate 100 Mg Caps (Doxycycline hyclate) .Marland Kitchen.. 1po two times a day 7)  Singulair 10 Mg Tabs (Montelukast sodium) .Marland Kitchen.. 1po once daily 8)  Prednisone 10 Mg Tabs (Prednisone) .... 3po qd for 3days, then 2po qd for 3days, then 1po qd for 3days, then stop  Patient Instructions: 1)  you had the steroid shot today 2)  Please take all new medications as prescribed 3)  Continue all previous medications as before this visit  4)  You will be contacted about the referral(s) to: CT sinus 5)  please call if you feel you need the  referral to allergy 6)  Please schedule a follow-up appointment as needed. Prescriptions: PREDNISONE 10 MG TABS (PREDNISONE) 3po qd for 3days, then 2po qd for 3days, then 1po qd for 3days, then stop  #18 x 0   Entered and Authorized by:   Corwin Levins MD   Signed by:   Corwin Levins MD on 04/10/2010   Method used:   Print then Give to Patient   RxID:   [id number] SINGULAIR 10 MG TABS (MONTELUKAST SODIUM) 1po once daily  #30 x 11   Entered and Authorized by:   Corwin Levins MD   Signed by:  Corwin Levins MD on 04/10/2010   Method used:   Print then Give to Patient   RxID:   [id number] DOXYCYCLINE HYCLATE 100 MG CAPS (DOXYCYCLINE HYCLATE) 1po two times a day  #20 x 0   Entered and Authorized by:   Corwin Levins MD   Signed by:   Corwin Levins MD on 04/10/2010   Method used:   Print then Give to Patient   RxID:   [id number]    Medication Administration  Injection # 1:    Medication: Depo- Medrol 40mg     Diagnosis: ASTHMA (ICD-493.90)    Route: IM    Site: LUOQ gluteus    Exp Date: 10/2012    Lot #: [id number]    Mfr: [employer]    Given by: Zella Ball Ewing (Apr 10, 2010 3:40 PM)  Injection # 2:    Medication: Depo- Medrol 80mg     Diagnosis: ASTHMA (ICD-493.90)    Route: IM    Site: LUOQ gluteus    Exp Date: 11/201301    Lot #: [id number]    Mfr: [employer]    Given by: Zella Ball Ewing (Apr 10, 2010 3:40 PM)  Orders Added: 1)  Depo- Medrol 40mg  [J1030] 2)  Depo- Medrol 80mg  [J1040] 3)  Admin of Therapeutic Inj  intramuscular or subcutaneous [96372] 4)  Est. Patient Level IV [60737] 5)  Misc. Referral [Misc. Ref]

## 2011-01-06 NOTE — Assessment & Plan Note (Signed)
Summary: SINUS INFECTION/NWS   Vital Signs:  Patient profile:   35 year old female Height:      69.5 inches Weight:      190 pounds BMI:     27.76 O2 Sat:      97 % on Room air Temp:     98.4 degrees F oral Pulse rate:   90 / minute BP sitting:   110 / 80  (left arm) Cuff size:   regular  Vitals Entered ByZella Ball Ewing (February 28, 2010 10:43 AM)  O2 Flow:  Room air CC: sinus congestion, right ear pain, discuss recent labs/RE   Primary Care Provider:  Minus Breeding MD  CC:  sinus congestion, right ear pain, and discuss recent labs/RE.  History of Present Illness: here with acute onset facial pain, pressure, fever and greenish d/c for 3 days, on top of several wks nasal congestion not always controled with the allegra D;  Pt denies CP, sob, doe, wheezing, orthopnea, pnd, worsening LE edema, palps, dizziness or syncope  Pt denies new neuro symptoms such as headache, facial or extremity weakness  Had stopped her simvastatin to see if diet could work - tolerated the med well.  Overall o/w good complaince.    Problems Prior to Update: 1)  Hepatotoxicity, Drug-induced, Risk of  (ICD-V58.69) 2)  Tenosynovitis of Foot and Ankle  (ICD-727.06) 3)  Sinusitis- Acute-nos  (ICD-461.9) 4)  Dyslipidemia  (ICD-272.4) 5)  Fatigue  (ICD-780.79) 6)  Urinary Incontinence  (ICD-788.30) 7)  Hand Pain, Bilateral  (ICD-729.5) 8)  Allergic Rhinitis  (ICD-477.9)  Medications Prior to Update: 1)  Ponstel 250 Mg Caps (Mefenamic Acid) .... Use Asd Per Gyn 2)  Ecotrin 325 Mg Tbec (Aspirin) .Marland Kitchen.. 1 By Mouth Once Daily As Needed 3)  Tramadol Hcl 50 Mg Tabs (Tramadol Hcl) .Marland Kitchen.. 1 - 2 By Mouth Q 6 Hrs As Needed Pain 4)  Vesicare 5 Mg Tabs (Solifenacin Succinate) .Marland Kitchen.. 1po Once Daily 5)  Simvastatin 40 Mg Tabs (Simvastatin) .Marland Kitchen.. 1po Once Daily 6)  Avelox 400 Mg Tabs (Moxifloxacin Hcl) .... Once Daily 7)  Allegra-D 12 Hour 60-120 Mg Xr12h-Tab (Fexofenadine-Pseudoephedrine) .... One By Mouth Two Times A Day As  Needed For Nasal Cangestion  Current Medications (verified): 1)  Ponstel 250 Mg Caps (Mefenamic Acid) .... Use Asd Per Gyn 2)  Ecotrin 325 Mg Tbec (Aspirin) .Marland Kitchen.. 1 By Mouth Once Daily As Needed 3)  Tramadol Hcl 50 Mg Tabs (Tramadol Hcl) .Marland Kitchen.. 1 - 2 By Mouth Q 6 Hrs As Needed Pain 4)  Vesicare 5 Mg Tabs (Solifenacin Succinate) .Marland Kitchen.. 1po Once Daily 5)  Simvastatin 40 Mg Tabs (Simvastatin) .Marland Kitchen.. 1po Once Daily 6)  Avelox 400 Mg Tabs (Moxifloxacin Hcl) .... Once Daily 7)  Allegra-D 12 Hour 60-120 Mg Xr12h-Tab (Fexofenadine-Pseudoephedrine) .... One By Mouth Two Times A Day As Needed For Nasal Cangestion  Allergies (verified): 1)  ! Penicillin  Past History:  Past Medical History: Last updated: 08/15/2009 Hypercholesterolemia  Ovary cyst Allergic rhinitis hx of factor V leiden mutation positive mild positive ANA  Past Surgical History: Last updated: 10/03/2007 T&A  Social History: Last updated: 11/05/2009 Former Smoker Alcohol use-yes - rare Husband has had a vasectomy Alcohol use-no Drug use-no Regular exercise-yes Occupation: school teacher  Risk Factors: Exercise: yes (11/05/2009)  Risk Factors: Smoking Status: quit (11/05/2009)  Review of Systems       all otherwise negative per pt -    Physical Exam  General:  alert and overweight-appearing.  ,  mild ill  Head:  normocephalic and atraumatic.   Eyes:  vision grossly intact, pupils equal, and pupils round.   Ears:  bilat tm's red, sinus tender bilat Nose:  nasal dischargemucosal pallor and mucosal edema.   Mouth:  pharyngeal erythema and fair dentition.   Neck:  supple and no masses.   Lungs:  normal respiratory effort and normal breath sounds.   Heart:  normal rate and regular rhythm.   Extremities:  no edema, no erythema    Impression & Recommendations:  Problem # 1:  SINUSITIS- ACUTE-NOS (ICD-461.9)  Her updated medication list for this problem includes:    Avelox 400 Mg Tabs (Moxifloxacin hcl) .....  Once daily    Allegra-d 12 Hour 60-120 Mg Xr12h-tab (Fexofenadine-pseudoephedrine) ..... One by mouth two times a day as needed for nasal cangestion treat as above, f/u any worsening signs or symptoms   Problem # 2:  DYSLIPIDEMIA (ICD-272.4)  Her updated medication list for this problem includes:    Simvastatin 40 Mg Tabs (Simvastatin) .Marland Kitchen... 1po once daily  Labs Reviewed: SGOT: 14 (01/06/2010)   SGPT: 14 (01/06/2010)   HDL:59.80 (01/06/2010), 54.80 (10/14/2009)  LDL:67 (01/06/2010)  Chol:161 (01/06/2010), 218 (10/14/2009)  Trig:172.0 (01/06/2010), 73.0 (10/14/2009) treat as above, f/u any worsening signs or symptoms  - re- start med  Problem # 3:  ALLERGIC RHINITIS (ICD-477.9) treat as above, f/u any worsening signs or symptoms - the allegra d  Complete Medication List: 1)  Ponstel 250 Mg Caps (Mefenamic acid) .... Use asd per gyn 2)  Ecotrin 325 Mg Tbec (Aspirin) .Marland Kitchen.. 1 by mouth once daily as needed 3)  Tramadol Hcl 50 Mg Tabs (Tramadol hcl) .Marland Kitchen.. 1 - 2 by mouth q 6 hrs as needed pain 4)  Vesicare 5 Mg Tabs (Solifenacin succinate) .Marland Kitchen.. 1po once daily 5)  Simvastatin 40 Mg Tabs (Simvastatin) .Marland Kitchen.. 1po once daily 6)  Avelox 400 Mg Tabs (Moxifloxacin hcl) .... Once daily 7)  Allegra-d 12 Hour 60-120 Mg Xr12h-tab (Fexofenadine-pseudoephedrine) .... One by mouth two times a day as needed for nasal cangestion  Patient Instructions: 1)  please re-start the simvastatin at 40 mg per day 2)  Please take all new medications as prescribed - the avelox 3)  Continue all previous medications as before this visit , including the allegra D 4)  You can also use Mucinex OTC or it's generic for congestion  5)  Please schedule an appointment with your primary doctor as needed Prescriptions: AVELOX 400 MG TABS (MOXIFLOXACIN HCL) once daily  #10 x 0   Entered and Authorized by:   Corwin Levins MD   Signed by:   Corwin Levins MD on 02/28/2010   Method used:   Print then Give to Patient   RxID:    [id number]

## 2011-01-08 NOTE — Assessment & Plan Note (Signed)
Summary: sinus problem  stc   Vital Signs:  Patient profile:   35 year old female Height:      68.5 inches Weight:      195.50 pounds BMI:     29.40 O2 Sat:      98 % on Room air Temp:     98.3 degrees F oral Pulse rate:   90 / minute BP sitting:   102 / 70  (left arm) Cuff size:   large  Vitals Entered By: Zella Ball Ewing CMA Duncan Dull) (November 17, 2010 2:44 PM)  O2 Flow:  Room air CC: Sinus Congestion, Sore throat/RE   Primary Care Provider:  Minus Breeding MD  CC:  Sinus Congestion and Sore throat/RE.  History of Present Illness: here with acute onset 10 days midl to mod facial pain, pressure, fever and greenish d/c, with mild ST and non prod cough,  maylgias, general weakness and malaise.  Pt denies CP, worsening sob, doe, wheezing, orthopnea, pnd, worsening LE edema, palps, dizziness or syncope   No help with OTC meds such as nyquil. Has also been tyring mucinex as well.    Problems Prior to Update: 1)  Abdominal Pain Right Upper Quadrant  (ICD-789.01) 2)  Asthma  (ICD-493.90) 3)  Hepatotoxicity, Drug-induced, Risk of  (ICD-V58.69) 4)  Tenosynovitis of Foot and Ankle  (ICD-727.06) 5)  Sinusitis- Acute-nos  (ICD-461.9) 6)  Dyslipidemia  (ICD-272.4) 7)  Fatigue  (ICD-780.79) 8)  Urinary Incontinence  (ICD-788.30) 9)  Hand Pain, Bilateral  (ICD-729.5) 10)  Allergic Rhinitis  (ICD-477.9)  Medications Prior to Update: 1)  Ponstel 250 Mg Caps (Mefenamic Acid) .... Use Asd Per Gyn 2)  Ecotrin 325 Mg Tbec (Aspirin) .Marland Kitchen.. 1 By Mouth Once Daily As Needed 3)  Tramadol Hcl 50 Mg Tabs (Tramadol Hcl) .Marland Kitchen.. 1 - 2 By Mouth Q 6 Hrs As Needed Pain 4)  Vesicare 5 Mg Tabs (Solifenacin Succinate) .Marland Kitchen.. 1po Once Daily 5)  Simvastatin 40 Mg Tabs (Simvastatin) .Marland Kitchen.. 1po Once Daily 6)  Doxycycline Hyclate 100 Mg Caps (Doxycycline Hyclate) .Marland Kitchen.. 1po Two Times A Day 7)  Singulair 10 Mg Tabs (Montelukast Sodium) .Marland Kitchen.. 1po Once Daily 8)  Mucinex 600 Mg Xr12h-Tab (Guaifenesin) .Marland Kitchen.. 1po Two Times A Day As  Needed 9)  Zaditor 0.025 % Soln (Ketotifen Fumarate) .... Use Asd  Current Medications (verified): 1)  Ponstel 250 Mg Caps (Mefenamic Acid) .... Use Asd Per Gyn 2)  Ecotrin 325 Mg Tbec (Aspirin) .Marland Kitchen.. 1 By Mouth Once Daily As Needed 3)  Tramadol Hcl 50 Mg Tabs (Tramadol Hcl) .Marland Kitchen.. 1 - 2 By Mouth Q 6 Hrs As Needed Pain 4)  Vesicare 5 Mg Tabs (Solifenacin Succinate) .Marland Kitchen.. 1po Once Daily 5)  Simvastatin 40 Mg Tabs (Simvastatin) .Marland Kitchen.. 1po Once Daily 6)  Levofloxacin 500 Mg Tabs (Levofloxacin) .Marland Kitchen.. 1po Once Daily 7)  Singulair 10 Mg Tabs (Montelukast Sodium) .Marland Kitchen.. 1po Once Daily 8)  Mucinex 600 Mg Xr12h-Tab (Guaifenesin) .Marland Kitchen.. 1po Two Times A Day As Needed 9)  Zaditor 0.025 % Soln (Ketotifen Fumarate) .... Use Asd 10)  Tessalon Perles 100 Mg Caps (Benzonatate) .Marland Kitchen.. 1-2 By Mouth Three Times A Day As Needed For Cough  Allergies (verified): 1)  ! Penicillin 2)  Biaxin  Past History:  Past Medical History: Last updated: 04/10/2010 Hypercholesterolemia  Ovary cyst Allergic rhinitis hx of factor V leiden mutation positive mild positive ANA Asthma  Past Surgical History: Last updated: 10/03/2007 T&A  Social History: Last updated: 11/05/2009 Former Smoker Alcohol use-yes - rare Husband  has had a vasectomy Alcohol use-no Drug use-no Regular exercise-yes Occupation: school teacher  Risk Factors: Exercise: yes (11/05/2009)  Risk Factors: Smoking Status: quit (11/05/2009)  Review of Systems       all otherwise negative per pt -    Physical Exam  General:  alert and overweight-appearing.   Head:  normocephalic and atraumatic.   Eyes:  vision grossly intact, pupils equal, and pupils round.   Ears:  bilat tm's midl red, sinus tender bilat Nose:  nasal dischargemucosal pallor and mucosal edema.   Mouth:  pharyngeal erythema and fair dentition.   Neck:  supple and cervical lymphadenopathy - tender Lungs:  normal respiratory effort and normal breath sounds.   Heart:  normal rate  and regular rhythm.   Extremities:  no edema, no erythema    Impression & Recommendations:  Problem # 1:  SINUSITIS- ACUTE-NOS (ICD-461.9)  Her updated medication list for this problem includes:    Levofloxacin 500 Mg Tabs (Levofloxacin) .Marland Kitchen... 1po once daily    Mucinex 600 Mg Xr12h-tab (Guaifenesin) .Marland Kitchen... 1po two times a day as needed    Tessalon Perles 100 Mg Caps (Benzonatate) .Marland Kitchen... 1-2 by mouth three times a day as needed for cough treat as above, f/u any worsening signs or symptoms   Complete Medication List: 1)  Ponstel 250 Mg Caps (Mefenamic acid) .... Use asd per gyn 2)  Ecotrin 325 Mg Tbec (Aspirin) .Marland Kitchen.. 1 by mouth once daily as needed 3)  Tramadol Hcl 50 Mg Tabs (Tramadol hcl) .Marland Kitchen.. 1 - 2 by mouth q 6 hrs as needed pain 4)  Vesicare 5 Mg Tabs (Solifenacin succinate) .Marland Kitchen.. 1po once daily 5)  Simvastatin 40 Mg Tabs (Simvastatin) .Marland Kitchen.. 1po once daily 6)  Levofloxacin 500 Mg Tabs (Levofloxacin) .Marland Kitchen.. 1po once daily 7)  Singulair 10 Mg Tabs (Montelukast sodium) .Marland Kitchen.. 1po once daily 8)  Mucinex 600 Mg Xr12h-tab (Guaifenesin) .Marland Kitchen.. 1po two times a day as needed 9)  Zaditor 0.025 % Soln (Ketotifen fumarate) .... Use asd 10)  Tessalon Perles 100 Mg Caps (Benzonatate) .Marland Kitchen.. 1-2 by mouth three times a day as needed for cough  Patient Instructions: 1)  Please take all new medications as prescribed  - the antibiotic, and the pill for cough Only if needed 2)  Continue all previous medications as before this visit  3)  Please schedule a follow-up appointment 6 months for CPX with labs Prescriptions: TESSALON PERLES 100 MG CAPS (BENZONATATE) 1-2 by mouth three times a day as needed for cough  #60 x 0   Entered and Authorized by:   Corwin Levins MD   Signed by:   Corwin Levins MD on 11/17/2010   Method used:   Print then Give to Patient   RxID:   [id number] LEVOFLOXACIN 500 MG TABS (LEVOFLOXACIN) 1po once daily  #10 x 0   Entered and Authorized by:   Corwin Levins MD   Signed by:   Corwin Levins MD on 11/17/2010   Method used:   Print then Give to Patient   RxID:   [id number]    Orders Added: 1)  Est. Patient Level III [56433]

## 2011-01-08 NOTE — Assessment & Plan Note (Signed)
Summary: ?sinus infection-lb   Vital Signs:  Patient profile:   35 year old female Weight:      198 pounds BMI:     29.78 Temp:     98.5 degrees F Pulse rate:   77 / minute BP sitting:   114 / 78  (left arm) Cuff size:   regular  Vitals Entered By: Lamar Sprinkles, CMA (January 03, 2011 9:59 AM) CC: sinus pain & pressure/SD   History of Present Illness: Patient is a 35 Caucasian female in today with 10 day history of worsening nasal congestion, rhinorrhea, facial pain over both maxillary and frontal sinuses. No fevers or chills. She is a Engineer, site teaching 2 nd grade and she has trouble with recurrent sinus infections. She is struggling with some left ear pain as well but denies any HA/sore throat/CP/SOB/palp/GI or GU concerns. Does have some PND and cough at times.  Current Medications (verified): 1)  Ponstel 250 Mg Caps (Mefenamic Acid) .... Use Asd Per Gyn 2)  Ecotrin 325 Mg Tbec (Aspirin) .Marland Kitchen.. 1 By Mouth Once Daily As Needed 3)  Simvastatin 40 Mg Tabs (Simvastatin) .Marland Kitchen.. 1po Once Daily 4)  Singulair 10 Mg Tabs (Montelukast Sodium) .Marland Kitchen.. 1po Once Daily 5)  Mucinex 600 Mg Xr12h-Tab (Guaifenesin) .Marland Kitchen.. 1po Two Times A Day As Needed 6)  Tessalon Perles 100 Mg Caps (Benzonatate) .Marland Kitchen.. 1-2 By Mouth Three Times A Day As Needed For Cough  Allergies (verified): 1)  ! Penicillin 2)  Biaxin  Past History:  Past medical history reviewed for relevance to current acute and chronic problems. Social history (including risk factors) reviewed for relevance to current acute and chronic problems.  Past Medical History: Reviewed history from 04/10/2010 and no changes required. Hypercholesterolemia  Ovary cyst Allergic rhinitis hx of factor V leiden mutation positive mild positive ANA Asthma  Social History: Reviewed history from 11/05/2009 and no changes required. Former Smoker Alcohol use-yes - rare Husband has had a vasectomy Alcohol use-no Drug use-no Regular  exercise-yes Occupation: school teacher  Review of Systems      See HPI  Physical Exam  General:  Well-developed,well-nourished,in no acute distress; alert,appropriate and cooperative throughout examination Head:  Normocephalic and atraumatic without obvious abnormalities. No apparent alopecia or balding. Ears:  External ear exam shows no significant lesions or deformities.  Otoscopic examination reveals clear canals, tympanic membranes are intact bilaterally without bulging, retraction, inflammation or discharge. Hearing is grossly normal bilaterally. TMs dull b/l Nose:  mucosal erythema and mucosal edema.   Mouth:  Oral mucosa and oropharynx without lesions or exudates.  Teeth in good repair. Neck:  No deformities, masses, or tenderness noted. Lungs:  Normal respiratory effort, chest expands symmetrically. Lungs are clear to auscultation, no crackles or wheezes. Heart:  Normal rate and regular rhythm. S1 and S2 normal without gallop, murmur, click, rub or other extra sounds. Abdomen:  Bowel sounds positive,abdomen soft and non-tender without masses, organomegaly or hernias noted. Extremities:  No clubbing, cyanosis, edema, or deformity noted with normal full range of motion of all joints.   Psych:  Cognition and judgment appear intact. Alert and cooperative with normal attention span and concentration. No apparent delusions, illusions, hallucinations   Impression & Recommendations:  Problem # 1:  SINUSITIS- ACUTE-NOS (ICD-461.9)  The following medications were removed from the medication list:    Levofloxacin 500 Mg Tabs (Levofloxacin) .Marland Kitchen... 1po once daily Her updated medication list for this problem includes:    Mucinex 600 Mg Xr12h-tab (Guaifenesin) .Marland Kitchen... 1po two times  a day as needed    Tessalon Perles 100 Mg Caps (Benzonatate) .Marland Kitchen... 1-2 by mouth three times a day as needed for cough    Cefdinir 300 Mg Caps (Cefdinir) .Marland Kitchen... 1 cap by mouth two times a day x 14 days    Mucinex 600  Mg Xr12h-tab (Guaifenesin) .Marland Kitchen... 1 tab by mouth two times a day x 10 day Increase fluids and start a probiotic  Complete Medication List: 1)  Ponstel 250 Mg Caps (Mefenamic acid) .... Use asd per gyn 2)  Ecotrin 325 Mg Tbec (Aspirin) .Marland Kitchen.. 1 by mouth once daily as needed 3)  Simvastatin 40 Mg Tabs (Simvastatin) .Marland Kitchen.. 1po once daily 4)  Singulair 10 Mg Tabs (Montelukast sodium) .Marland Kitchen.. 1po once daily 5)  Mucinex 600 Mg Xr12h-tab (Guaifenesin) .Marland Kitchen.. 1po two times a day as needed 6)  Tessalon Perles 100 Mg Caps (Benzonatate) .Marland Kitchen.. 1-2 by mouth three times a day as needed for cough 7)  Cefdinir 300 Mg Caps (Cefdinir) .Marland Kitchen.. 1 cap by mouth two times a day x 14 days 8)  Mucinex 600 Mg Xr12h-tab (Guaifenesin) .Marland Kitchen.. 1 tab by mouth two times a day x 10 day  Patient Instructions: 1)  Please schedule a follow-up appointment as needed with PMD if symptoms do not improve 2)  Take your antibiotic as prescribed until ALL of it is gone, but stop if you develop a rash or swelling and contact our office as soon as possible.  3)  Acute sinusitis symptoms for less than 10 days are not helped by antibiotics. Use warm moist compresses, and over the counter decongestants( only as directed). Call if no improvement in 5-7 days, sooner if increasing pain, fever, or new symptoms.  4)  Increase fluids 5)  Start a probiotic such as Librarian, academic or a yogurt daiily Prescriptions: CEFDINIR 300 MG CAPS (CEFDINIR) 1 cap by mouth two times a day x 14 days  #28 x 0   Entered and Authorized by:   Danise Edge MD   Signed by:   Danise Edge MD on 01/03/2011   Method used:   Electronically to        [facility] [phone]* (retail)       [address]       [place], [place]  [address]       Ph: [phone] or [phone]       Fax: [phone]   RxID:   [id number]    Orders Added: 1)  Est. Patient Level II [30160]

## 2011-04-20 ENCOUNTER — Telehealth: Payer: Self-pay

## 2011-04-20 MED ORDER — MONTELUKAST SODIUM 10 MG PO TABS: 10.0000 mg | ORAL_TABLET | Freq: Every day | ORAL | Status: DC

## 2011-04-20 NOTE — Telephone Encounter (Signed)
A user error has taken place: encounter opened in error, closed for administrative reasons.

## 2011-04-23 ENCOUNTER — Telehealth: Payer: Self-pay | Admitting: *Deleted

## 2011-04-23 NOTE — Telephone Encounter (Signed)
PA requested for: Singulair 10mg  tablet PA form requested from [employer] @ [phone] [04/22/11] Authorization received via telephone & pharmacy informed [04/22/11]  Official letter recvd & faxed to pharmacy.

## 2011-04-24 NOTE — Assessment & Plan Note (Signed)
[facility]                                   ON-CALL NOTE   NAME:MURPHY-JUSTICEMarene, Gilliam               MRN:          [id number]  DATE:08/30/2006                            DOB:          1976-03-15    Ms. Robin Ayers is a patient of Dr. Jonny Ruiz, who states she saw him today and was to  have 2 prescriptions called in, i.e. an antibiotic and a decongestant.  She  went to the pharmacy, and unfortunately the pharmacy did not have the  prescriptions at hand.   PLAN:  I advised the patient to call in about an hour to recheck the  pharmacy.  If the prescriptions have not been called in at that point, she  is to call the office tomorrow morning given that she is clinically stable  at this point.  The patient expressed understanding.                                   Leanne Chang, M.D.   LA/MedQ  DD:  08/31/2006  DT:  09/02/2006  Job #:  [id number]   cc:   Corwin Levins, MD

## 2011-05-25 ENCOUNTER — Other Ambulatory Visit: Payer: Self-pay

## 2011-05-25 MED ORDER — SIMVASTATIN 40 MG PO TABS: 40.0000 mg | ORAL_TABLET | Freq: Every day | ORAL | Status: DC

## 2011-07-06 ENCOUNTER — Other Ambulatory Visit: Payer: Self-pay | Admitting: Internal Medicine

## 2011-07-06 ENCOUNTER — Other Ambulatory Visit (INDEPENDENT_AMBULATORY_CARE_PROVIDER_SITE_OTHER): Payer: BC Managed Care – PPO

## 2011-07-06 ENCOUNTER — Ambulatory Visit (INDEPENDENT_AMBULATORY_CARE_PROVIDER_SITE_OTHER): Payer: BC Managed Care – PPO | Admitting: Internal Medicine

## 2011-07-06 ENCOUNTER — Encounter: Payer: Self-pay | Admitting: Internal Medicine

## 2011-07-06 VITALS — BP 110/80 | HR 74 | Temp 98.0°F | Ht 68.0 in | Wt 188.1 lb

## 2011-07-06 DIAGNOSIS — Z0001 Encounter for general adult medical examination with abnormal findings: Secondary | ICD-10-CM | POA: Insufficient documentation

## 2011-07-06 DIAGNOSIS — Z0000 Encounter for general adult medical examination without abnormal findings: Secondary | ICD-10-CM

## 2011-07-06 DIAGNOSIS — J019 Acute sinusitis, unspecified: Secondary | ICD-10-CM

## 2011-07-06 LAB — BASIC METABOLIC PANEL
Calcium: 9.1 mg/dL (ref 8.4–10.5)
Chloride: 105 mEq/L (ref 96–112)
GFR: 90.74 mL/min (ref >60.00)
Glucose, Bld: 97 mg/dL (ref 70–99)
Potassium: 4.1 mEq/L (ref 3.5–5.1)

## 2011-07-06 LAB — CBC WITH DIFFERENTIAL/PLATELET
Basophils Absolute: 0 10*3/uL (ref 0.0–0.1)
Eosinophils Absolute: 0.3 10*3/uL (ref 0.0–0.7)
Eosinophils Relative: 3.6 % (ref 0.0–5.0)
Hemoglobin: 14.6 g/dL (ref 12.0–15.0)
Lymphs Abs: 1.7 10*3/uL (ref 0.7–4.0)
MCHC: 32.6 g/dL (ref 30.0–36.0)
Monocytes Relative: 6.1 % (ref 3.0–12.0)
Neutrophils Relative %: 67.4 % (ref 43.0–77.0)
Platelets: 252 10*3/uL (ref 150.0–400.0)
RBC: 5.04 Mil/uL (ref 3.87–5.11)
RDW: 13.6 % (ref 11.5–14.6)

## 2011-07-06 LAB — HEPATIC FUNCTION PANEL
AST: 18 U/L (ref 0–37)
Alkaline Phosphatase: 97 U/L (ref 39–117)
Bilirubin, Direct: 0.1 mg/dL (ref 0.0–0.3)
Total Bilirubin: 0.6 mg/dL (ref 0.3–1.2)
Total Protein: 7.3 g/dL (ref 6.0–8.3)

## 2011-07-06 LAB — URINALYSIS, ROUTINE W REFLEX MICROSCOPIC
Bilirubin Urine: NEGATIVE
Leukocytes, UA: NEGATIVE
Urobilinogen, UA: 0.2 (ref 0.0–1.0)

## 2011-07-06 LAB — LIPID PANEL: VLDL: 44.2 mg/dL — ABNORMAL HIGH (ref 0.0–40.0)

## 2011-07-06 MED ORDER — LEVOFLOXACIN 500 MG PO TABS: 500.0000 mg | ORAL_TABLET | Freq: Every day | ORAL | Status: AC

## 2011-07-06 NOTE — Progress Notes (Signed)
Subjective:    Patient ID: Robin Ayers, female    DOB: [birth year], 35 y.o.   MRN: [id number]  HPI  Here for wellness and f/u;  Overall doing ok;  Pt denies CP, worsening SOB, DOE, wheezing, orthopnea, PND, worsening LE edema, palpitations, dizziness or syncope.  Pt denies neurological change such as new Headache, facial or extremity weakness.  Pt denies polydipsia, polyuria, or low sugar symptoms. Pt states overall good compliance with treatment and medications, good tolerability, and trying to follow lower cholesterol diet.  Pt denies worsening depressive symptoms, suicidal ideation or panic. No fever, wt loss, night sweats, loss of appetite, or other constitutional symptoms.  Pt states good ability with ADL's, low fall risk, home safety reviewed and adequate, no significant changes in hearing or vision, and occasionally active with exercise. Also  Here with 3 days acute onset fever, facial pain, pressure, general weakness and malaise, and greenish d/c, with slight ST, but little to no cough and Pt denies chest pain, increased sob or doe, wheezing, orthopnea, PND, increased LE swelling, palpitations, dizziness or syncope. Past Medical History  Diagnosis Date  . DYSLIPIDEMIA 06/24/2009  . ALLERGIC RHINITIS 10/26/2007  . ASTHMA 04/10/2010  . Factor V Leiden mutation     hx of  . Ovarian cyst    Past Surgical History  Procedure Date  . Tonsillectomy and adenoidectomy     reports that she has quit smoking. She does not have any smokeless tobacco history on file. She reports that she does not drink alcohol or use illicit drugs. family history includes Diabetes in her other; Heart attack (age of onset:35) in her mother; Heart disease in her mother; and Hypertension in her other. Allergies  Allergen Reactions  . Clarithromycin     REACTION: GI upset  . Penicillins     REACTION: rash   Current Outpatient Prescriptions on File Prior to Visit  Medication Sig Dispense Refill  . aspirin  325 MG EC tablet Take 325 mg by mouth daily as needed.        . Mefenamic Acid (PONSTEL) 250 MG CAPS Take by mouth. Use as directed per GYN       . montelukast (SINGULAIR) 10 MG tablet Take 1 tablet (10 mg total) by mouth at bedtime.  30 tablet  10  . simvastatin (ZOCOR) 40 MG tablet Take 1 tablet (40 mg total) by mouth daily.  90 tablet  2  . guaiFENesin (MUCINEX) 600 MG 12 hr tablet Take 1,200 mg by mouth 2 (two) times daily as needed.         Review of Systems Review of Systems  Constitutional: Negative for diaphoresis, activity change, appetite change and unexpected weight change.  HENT: Negative for hearing loss, ear pain, facial swelling, mouth sores and neck stiffness.   Eyes: Negative for pain, redness and visual disturbance.  Respiratory: Negative for shortness of breath and wheezing.   Cardiovascular: Negative for chest pain and palpitations.  Gastrointestinal: Negative for diarrhea, blood in stool, abdominal distention and rectal pain.  Genitourinary: Negative for hematuria, flank pain and decreased urine volume.  Musculoskeletal: Negative for myalgias and joint swelling.  Skin: Negative for color change and wound.  Neurological: Negative for syncope and numbness.  Hematological: Negative for adenopathy.  Psychiatric/Behavioral: Negative for hallucinations, self-injury, decreased concentration and agitation.      Objective:   Physical Exam  BP 110/80  Pulse 74  Temp(Src) 98 F (36.7 C) (Oral)  Ht 5\' 8"  (1.727 m)  Wt 188 lb  2 oz (85.333 kg)  BMI 28.60 kg/m2  SpO2 97%  LMP 06/30/2011 Physical Exam  VS noted, mild ill  Constitutional: Pt is oriented to person, place, and time. Appears well-developed and well-nourished.  HENT:  Head: Normocephalic and atraumatic.  Right Ear: External ear normal.  Left Ear: External ear normal.  Nose: Nose normal.  Bilat tm's mild erythema.  Sinus tender bilat.  Pharynx mild erythema Mouth/Throat: Oropharynx is clear and moist.  Eyes:  Conjunctivae and EOM are normal. Pupils are equal, round, and reactive to light.  Neck: Normal range of motion. Neck supple. No JVD present. No tracheal deviation present.  Cardiovascular: Normal rate, regular rhythm, normal heart sounds and intact distal pulses.   Pulmonary/Chest: Effort normal and breath sounds normal.  Abdominal: Soft. Bowel sounds are normal. There is no tenderness.  Musculoskeletal: Normal range of motion. Exhibits no edema.  Lymphadenopathy:  Has no cervical adenopathy.  Neurological: Pt is alert and oriented to person, place, and time. Pt has normal reflexes. No cranial nerve deficit.  Skin: Skin is warm and dry. No rash noted.  Psychiatric:  Has  normal mood and affect. Behavior is normal.        Assessment & Plan:   No problem-specific assessment & plan notes found for this encounter.

## 2011-07-06 NOTE — Assessment & Plan Note (Signed)
Mild to mod, for antibx course,  to f/u any worsening symptoms or concerns 

## 2011-07-06 NOTE — Patient Instructions (Signed)
Take all new medications as prescribed Continue all other medications as before You can also take Delsym OTC for cough, and/or Mucinex (or it's generic off brand) for congestion Please go to LAB in the Basement for the blood and/or urine tests to be done today Please call the phone number [phone] (the PhoneTree System) for results of testing in 2-3 days;  When calling, simply dial the number, and when prompted enter the MRN number above (the Medical Record Number) and the # key, then the message should start.

## 2011-07-06 NOTE — Assessment & Plan Note (Signed)

## 2011-07-07 LAB — TSH: TSH: 1.26 u[IU]/mL (ref 0.35–5.50)

## 2011-07-07 LAB — LDL CHOLESTEROL, DIRECT: Direct LDL: 117.5 mg/dL

## 2011-09-08 LAB — CBC
HCT: 41.6
Hemoglobin: 13.6
Hemoglobin: 14
MCHC: 33.7
Platelets: 228
RBC: 4.35
RBC: 4.55
RDW: 14.2

## 2011-09-21 ENCOUNTER — Ambulatory Visit (INDEPENDENT_AMBULATORY_CARE_PROVIDER_SITE_OTHER)
Admission: RE | Admit: 2011-09-21 | Discharge: 2011-09-21 | Disposition: A | Discharge status: Home / Self Care | Payer: BC Managed Care – PPO | Source: Ambulatory Visit | Attending: Internal Medicine | Admitting: Internal Medicine

## 2011-09-21 ENCOUNTER — Encounter: Payer: Self-pay | Admitting: Internal Medicine

## 2011-09-21 ENCOUNTER — Ambulatory Visit (INDEPENDENT_AMBULATORY_CARE_PROVIDER_SITE_OTHER): Payer: BC Managed Care – PPO | Admitting: Internal Medicine

## 2011-09-21 VITALS — BP 106/74 | HR 82 | Temp 97.4°F | Wt 192.0 lb

## 2011-09-21 DIAGNOSIS — J209 Acute bronchitis, unspecified: Secondary | ICD-10-CM

## 2011-09-21 DIAGNOSIS — R059 Cough, unspecified: Secondary | ICD-10-CM

## 2011-09-21 DIAGNOSIS — R05 Cough: Secondary | ICD-10-CM

## 2011-09-21 MED ORDER — PSEUDOEPH-CHLORPHEN-HYDROCOD 60-4-5 MG/5ML PO SOLN: 5.0000 mL | Freq: Four times a day (QID) | ORAL | Status: DC | PRN

## 2011-09-21 MED ORDER — CEFUROXIME AXETIL 500 MG PO TABS: 500.0000 mg | ORAL_TABLET | Freq: Two times a day (BID) | ORAL | Status: AC

## 2011-09-21 NOTE — Progress Notes (Signed)
  Subjective:    Patient ID: Robin Ayers, female    DOB: 12/13/1975, 35 y.o.   MRN: [id number]  Cough This is a new problem. The current episode started 1 to 4 weeks ago. The problem has been gradually worsening. The problem occurs every few minutes. The cough is non-productive. Associated symptoms include a sore throat. Pertinent negatives include no chest pain, chills, ear congestion, ear pain, fever, headaches, heartburn, hemoptysis, myalgias, nasal congestion, postnasal drip, rash, rhinorrhea, shortness of breath, sweats, weight loss or wheezing. The symptoms are aggravated by nothing. Treatments tried: biaxin, tessalon, mucinex-dm. The treatment provided no relief. Her past medical history is significant for asthma.      Review of Systems  Constitutional: Negative for fever, chills, weight loss, diaphoresis, activity change, appetite change, fatigue and unexpected weight change.  HENT: Positive for sore throat. Negative for hearing loss, ear pain, nosebleeds, congestion, facial swelling, rhinorrhea, mouth sores, trouble swallowing, neck pain, neck stiffness, voice change, postnasal drip, sinus pressure, tinnitus and ear discharge.   Eyes: Negative.   Respiratory: Positive for cough. Negative for apnea, hemoptysis, choking, chest tightness, shortness of breath, wheezing and stridor.   Cardiovascular: Negative for chest pain, palpitations and leg swelling.  Gastrointestinal: Negative for heartburn, nausea, vomiting, abdominal pain, diarrhea, constipation, blood in stool, abdominal distention, anal bleeding and rectal pain.  Genitourinary: Negative.   Musculoskeletal: Negative for myalgias, back pain, joint swelling, arthralgias and gait problem.  Skin: Negative for color change, pallor, rash and wound.  Neurological: Negative for dizziness, tremors, seizures, syncope, facial asymmetry, speech difficulty, weakness, light-headedness, numbness and headaches.  Hematological: Negative for  adenopathy. Does not bruise/bleed easily.  Psychiatric/Behavioral: Negative.        Objective:   Physical Exam  Vitals reviewed. Constitutional: She is oriented to person, place, and time. She appears well-developed and well-nourished. No distress.  HENT:  Head: Normocephalic and atraumatic.  Mouth/Throat: Oropharynx is clear and moist. No oropharyngeal exudate.  Eyes: Conjunctivae are normal. Right eye exhibits no discharge. Left eye exhibits no discharge. No scleral icterus.  Neck: Normal range of motion. Neck supple. No JVD present. No tracheal deviation present. No thyromegaly present.  Cardiovascular: Normal rate, regular rhythm, normal heart sounds and intact distal pulses.  Exam reveals no gallop and no friction rub.   No murmur heard. Pulmonary/Chest: Effort normal and breath sounds normal. No accessory muscle usage or stridor. Not tachypneic. No respiratory distress. She has no decreased breath sounds. She has no wheezes. She has no rhonchi. She has no rales. She exhibits no tenderness.  Abdominal: Soft. Bowel sounds are normal. She exhibits no distension and no mass. There is no tenderness. There is no rebound and no guarding.  Musculoskeletal: Normal range of motion. She exhibits no edema and no tenderness.  Lymphadenopathy:    She has no cervical adenopathy.  Neurological: She is oriented to person, place, and time. She displays normal reflexes. She exhibits normal muscle tone. Coordination normal.  Skin: Skin is warm and dry. No rash noted. She is not diaphoretic. No erythema. No pallor.  Psychiatric: She has a normal mood and affect. Her behavior is normal. Judgment and thought content normal.          Assessment & Plan:

## 2011-09-21 NOTE — Assessment & Plan Note (Signed)
I will check a CXR to look for pna, mass, edema 

## 2011-09-21 NOTE — Patient Instructions (Signed)
Bronchitis Bronchitis is the body's way of reacting to injury and/or infection (inflammation) of the bronchi. Bronchi are the air tubes that extend from the windpipe into the lungs. If the inflammation becomes severe, it may cause shortness of breath.  CAUSES Inflammation may be caused by:  A virus.   Germs (bacteria).   Dust.   Allergens.   Pollutants and many other irritants.  The cells lining the bronchial tree are covered with tiny hairs (cilia). These constantly beat upward, away from the lungs, toward the mouth. This keeps the lungs free of pollutants. When these cells become too irritated and are unable to do their job, mucus begins to develop. This causes the characteristic cough of bronchitis. The cough clears the lungs when the cilia are unable to do their job. Without either of these protective mechanisms, the mucus would settle in the lungs. Then you would develop pneumonia. Smoking is a common cause of bronchitis and can contribute to pneumonia. Stopping this habit is the single most important thing you can do to help yourself. TREATMENT  Your caregiver may prescribe an antibiotic if the cough is caused by bacteria. Also, medicines that open up your airways make it easier to breathe. Your caregiver may also recommend or prescribe an expectorant. It will loosen the mucus to be coughed up. Only take over-the-counter or prescription medicines for pain, discomfort, or fever as directed by your caregiver.   Removing whatever causes the problem (smoking, for example) is critical to preventing the problem from getting worse.   Cough suppressants may be prescribed for relief of cough symptoms.   Inhaled medicines may be prescribed to help with symptoms now and to help prevent problems from returning.   For those with recurrent (chronic) bronchitis, there may be a need for steroid medicines.  SEEK IMMEDIATE MEDICAL CARE IF:  During treatment, you develop more pus-like mucus  (purulent sputum).   You or your child has an oral temperature above 100.5, not controlled by medicine.   Your baby is older than 3 months with a rectal temperature of 102 F (38.9 C) or higher.   Your baby is 3 months old or younger with a rectal temperature of 100.4 F (38 C) or higher.   You become progressively more ill.   You have increased difficulty breathing, wheezing, or shortness of breath.  It is necessary to seek immediate medical care if you are elderly or sick from any other disease. MAKE SURE YOU:  Understand these instructions.   Will watch your condition.   Will get help right away if you are not doing well or get worse.  Document Released: 11/23/2005 Document Re-Released: 02/17/2010 [employer] Patient Information 2011 [employer]. 

## 2011-09-21 NOTE — Assessment & Plan Note (Signed)
I will try ceftin and zutripro

## 2011-10-09 ENCOUNTER — Ambulatory Visit (INDEPENDENT_AMBULATORY_CARE_PROVIDER_SITE_OTHER): Payer: BC Managed Care – PPO | Admitting: Internal Medicine

## 2011-10-09 ENCOUNTER — Encounter: Payer: Self-pay | Admitting: Internal Medicine

## 2011-10-09 VITALS — BP 110/70 | HR 87 | Temp 98.7°F | Resp 16

## 2011-10-09 DIAGNOSIS — J45909 Unspecified asthma, uncomplicated: Secondary | ICD-10-CM

## 2011-10-09 DIAGNOSIS — J209 Acute bronchitis, unspecified: Secondary | ICD-10-CM

## 2011-10-09 MED ORDER — CEFUROXIME AXETIL 500 MG PO TABS: 500.0000 mg | ORAL_TABLET | Freq: Two times a day (BID) | ORAL | Status: AC

## 2011-10-09 MED ORDER — METHYLPREDNISOLONE ACETATE 80 MG/ML IJ SUSP
120.0000 mg | Freq: Once | INTRAMUSCULAR | Status: AC
  Administered 2011-10-09: 120 mg via INTRAMUSCULAR

## 2011-10-09 NOTE — Patient Instructions (Signed)

## 2011-10-11 NOTE — Assessment & Plan Note (Signed)
She needs another round of ceftin

## 2011-10-11 NOTE — Assessment & Plan Note (Signed)
She is showing some wheezing so I gave her a dose of depo-medrol IM and she will start using her inhalers at home

## 2011-10-11 NOTE — Progress Notes (Signed)
  Subjective:    Patient ID: Robin Ayers, female    DOB: May 09, 1976, 35 y.o.   MRN: [id number]  Cough This is a recurrent problem. The current episode started 1 to 4 weeks ago. The problem has been gradually worsening. The problem occurs every few hours. The cough is productive of purulent sputum. Associated symptoms include wheezing. Pertinent negatives include no chest pain, chills, ear congestion, ear pain, eye redness, fever, headaches, heartburn, hemoptysis, myalgias, nasal congestion, postnasal drip, rash, rhinorrhea, sore throat, shortness of breath, sweats or weight loss. Treatments tried: ceftin helped then she stopped it and s/s returned. The treatment provided moderate relief. Her past medical history is significant for asthma.      Review of Systems  Constitutional: Negative for fever, chills, weight loss, diaphoresis, activity change, appetite change, fatigue and unexpected weight change.  HENT: Negative for ear pain, sore throat, rhinorrhea, sneezing, trouble swallowing, voice change, postnasal drip and sinus pressure.   Eyes: Negative for photophobia, pain, discharge, redness, itching and visual disturbance.  Respiratory: Positive for cough and wheezing. Negative for apnea, hemoptysis, choking, chest tightness, shortness of breath and stridor.   Cardiovascular: Negative for chest pain, palpitations and leg swelling.  Gastrointestinal: Negative for heartburn, nausea, vomiting, abdominal pain, diarrhea and constipation.  Genitourinary: Negative.   Musculoskeletal: Negative for myalgias, back pain, joint swelling, arthralgias and gait problem.  Skin: Negative for color change, pallor, rash and wound.  Neurological: Negative for dizziness, tremors, seizures, syncope, facial asymmetry, speech difficulty, weakness, light-headedness, numbness and headaches.  Hematological: Negative for adenopathy. Does not bruise/bleed easily.  Psychiatric/Behavioral: Negative.          Objective:   Physical Exam  Vitals reviewed. Constitutional: She appears well-developed and well-nourished. No distress.  HENT:  Head: Normocephalic and atraumatic.  Mouth/Throat: Oropharynx is clear and moist. No oropharyngeal exudate.  Eyes: Conjunctivae are normal. Right eye exhibits no discharge. Left eye exhibits no discharge. No scleral icterus.  Neck: Normal range of motion. Neck supple. No JVD present. No tracheal deviation present. No thyromegaly present.  Cardiovascular: Normal rate, regular rhythm, normal heart sounds and intact distal pulses.  Exam reveals no gallop and no friction rub.   No murmur heard. Pulmonary/Chest: Effort normal. No accessory muscle usage or stridor. Not tachypneic. No respiratory distress. She has no decreased breath sounds. She has wheezes in the right lower field and the left lower field. She has no rhonchi. She has no rales. She exhibits no tenderness.  Abdominal: Soft. Bowel sounds are normal. She exhibits no distension and no mass. There is no tenderness. There is no rebound and no guarding.  Musculoskeletal: Normal range of motion. She exhibits no edema and no tenderness.  Lymphadenopathy:    She has no cervical adenopathy.  Skin: Skin is warm and dry. No rash noted. She is not diaphoretic. No erythema. No pallor.  Psychiatric: She has a normal mood and affect. Her behavior is normal. Judgment and thought content normal.          Assessment & Plan:

## 2011-10-28 ENCOUNTER — Ambulatory Visit (INDEPENDENT_AMBULATORY_CARE_PROVIDER_SITE_OTHER): Payer: BC Managed Care – PPO | Admitting: Internal Medicine

## 2011-10-28 ENCOUNTER — Encounter: Payer: Self-pay | Admitting: Internal Medicine

## 2011-10-28 VITALS — BP 112/78 | HR 120 | Temp 98.3°F | Ht 68.0 in | Wt 190.1 lb

## 2011-10-28 DIAGNOSIS — J309 Allergic rhinitis, unspecified: Secondary | ICD-10-CM

## 2011-10-28 DIAGNOSIS — J019 Acute sinusitis, unspecified: Secondary | ICD-10-CM

## 2011-10-28 DIAGNOSIS — R062 Wheezing: Secondary | ICD-10-CM

## 2011-10-28 DIAGNOSIS — J45909 Unspecified asthma, uncomplicated: Secondary | ICD-10-CM

## 2011-10-28 MED ORDER — METHYLPREDNISOLONE ACETATE PF 80 MG/ML IJ SUSP
120.0000 mg | Freq: Once | INTRAMUSCULAR | Status: AC
  Administered 2011-10-28: 120 mg via INTRAMUSCULAR

## 2011-10-28 MED ORDER — LEVOFLOXACIN 250 MG PO TABS: 250.0000 mg | ORAL_TABLET | Freq: Every day | ORAL | Status: DC

## 2011-10-28 MED ORDER — PREDNISONE 10 MG PO TABS: 10.0000 mg | ORAL_TABLET | Freq: Every day | ORAL | Status: AC

## 2011-10-28 MED ORDER — LEVALBUTEROL TARTRATE 45 MCG/ACT IN AERO: 2.0000 | INHALATION_SPRAY | Freq: Four times a day (QID) | RESPIRATORY_TRACT | Status: DC | PRN

## 2011-10-28 MED ORDER — MOMETASONE FURO-FORMOTEROL FUM 100-5 MCG/ACT IN AERO: 2.0000 | INHALATION_SPRAY | Freq: Two times a day (BID) | RESPIRATORY_TRACT | Status: DC

## 2011-10-28 MED ORDER — MONTELUKAST SODIUM 10 MG PO TABS: 10.0000 mg | ORAL_TABLET | Freq: Every day | ORAL | Status: DC

## 2011-10-28 NOTE — Patient Instructions (Addendum)
You had the steroid shot today Take all new medications as prescribed Continue all other medications as before OK to re-start the zocor when the antibiotics are finished

## 2011-10-29 ENCOUNTER — Encounter: Payer: Self-pay | Admitting: Internal Medicine

## 2011-10-29 NOTE — Assessment & Plan Note (Signed)
Mild to mod, for antibx course,  to f/u any worsening symptoms or concerns 

## 2011-10-29 NOTE — Progress Notes (Signed)
Subjective:    Patient ID: Robin Ayers, female    DOB: December 29, 1975, 35 y.o.   MRN: [id number]  HPI  Here with 4 wks recurrent symtpoms, that seem to improve with antibx, then recur quickly after done; was seen initially with UC, then Dr Yetta Barre twice, today with  Here with 3 days acute onset fever, facial pain, pressure, general weakness and malaise, and greenish d/c, with slight ST, but little to no cough and Pt denies chest pain, increased sob or doe, wheezing, orthopnea, PND, increased LE swelling, palpitations, dizziness or syncope until today with onset mild wheezing/tightness.  Pt denies new neurological symptoms such as new headache, or facial or extremity weakness or numbness.   Pt denies polydipsia, polyuria.  No hx of chronic sinus infection, HIV or other immune compromise, DM.   Has been not taking the zocor while on recent antibx Past Medical History  Diagnosis Date  . DYSLIPIDEMIA 06/24/2009  . ALLERGIC RHINITIS 10/26/2007  . ASTHMA 04/10/2010  . Factor V Leiden mutation     hx of  . Ovarian cyst    Past Surgical History  Procedure Date  . Tonsillectomy and adenoidectomy     reports that she has quit smoking. She does not have any smokeless tobacco history on file. She reports that she does not drink alcohol or use illicit drugs. family history includes Diabetes in her other; Heart attack (age of onset:35) in her mother; Heart disease in her mother; and Hypertension in her other. Allergies  Allergen Reactions  . Clarithromycin     REACTION: GI upset  . Penicillins     REACTION: rash   Current Outpatient Prescriptions on File Prior to Visit  Medication Sig Dispense Refill  . aspirin 325 MG EC tablet Take 325 mg by mouth daily as needed.        Marland Kitchen guaiFENesin (MUCINEX) 600 MG 12 hr tablet Take 1,200 mg by mouth 2 (two) times daily as needed.        . Pseudoeph-Chlorphen-Hydrocod (ZUTRIPRO) 60-4-5 MG/5ML SOLN Take 5 mLs by mouth 4 (four) times daily as needed.  240 mL  1    . Mefenamic Acid (PONSTEL) 250 MG CAPS Take by mouth. Use as directed per GYN       . simvastatin (ZOCOR) 40 MG tablet Take 1 tablet (40 mg total) by mouth daily.  90 tablet  2   Review of Systems Review of Systems  Constitutional: Negative for diaphoresis and unexpected weight change.  HENT: Negative for drooling and tinnitus.  normal.  Neurological: Pt is alert. No cranial nerve deficit.   Eyes: Negative for photophobia and visual disturbance.  Respiratory: Negative for choking and stridor.   Gastrointestinal: Negative for vomiting and blood in stool.  Genitourinary: Negative for hematuria and decreased urine volume.    Objective:   Physical Exam BP 112/78  Pulse 120  Temp(Src) 98.3 F (36.8 C) (Oral)  Ht 5\' 8"  (1.727 m)  Wt 190 lb 2 oz (86.24 kg)  BMI 28.91 kg/m2  SpO2 97%  LMP 10/24/2011 Physical Exam  VS noted, mild ill appearing Constitutional: Pt appears well-developed and well-nourished.  HENT: Head: Normocephalic.  Right Ear: External ear normal.  Left Ear: External ear normal.  Bilat tm's mild erythema.  Sinus tender bilat .  Pharynx mild erythema Eyes: Conjunctivae and EOM are normal. Pupils are equal, round, and reactive to light.  Neck: Normal range of motion. Neck supple.  Cardiovascular: Normal rate and regular rhythm.   Pulmonary/Chest: Effort normal  and breath sounds mild decreased with trace wheeze bilat Skin: Skin is warm. No erythema.  Psychiatric: Pt behavior is normal. Thought content normal. 1+ nervous    Assessment & Plan:

## 2011-10-29 NOTE — Assessment & Plan Note (Signed)
stable overall by hx and exam, , and pt to continue medical treatment as before   

## 2011-10-29 NOTE — Assessment & Plan Note (Signed)
O/w stable overall by hx and exam, most recent data reviewed with pt, and pt to continue medical treatment as before  SpO2 Readings from Last 3 Encounters:  10/28/11 97%  10/09/11 97%  09/21/11 98%

## 2011-10-29 NOTE — Assessment & Plan Note (Signed)
Mild to mod, for depomedrol IM,and predpack asd,  to f/u any worsening symptoms or concerns 

## 2011-11-05 ENCOUNTER — Telehealth: Payer: Self-pay | Admitting: *Deleted

## 2011-11-05 DIAGNOSIS — J019 Acute sinusitis, unspecified: Secondary | ICD-10-CM

## 2011-11-05 MED ORDER — LEVOFLOXACIN 250 MG PO TABS: 250.0000 mg | ORAL_TABLET | Freq: Every day | ORAL | Status: AC

## 2011-11-05 NOTE — Telephone Encounter (Signed)
Done per emr 

## 2011-11-05 NOTE — Telephone Encounter (Signed)
Pt states she has 1 day of ABX left to take and is no better, still wheezing, and requesting extension on ABX [has been to MD office 4 times and would like to not have to come back in if at all possible.

## 2011-11-06 NOTE — Telephone Encounter (Signed)
Called the patient left message that prescription requested has been sent in. 

## 2012-01-27 ENCOUNTER — Ambulatory Visit (INDEPENDENT_AMBULATORY_CARE_PROVIDER_SITE_OTHER): Payer: BC Managed Care – PPO | Admitting: Internal Medicine

## 2012-01-27 ENCOUNTER — Encounter: Payer: Self-pay | Admitting: Internal Medicine

## 2012-01-27 VITALS — BP 110/68 | HR 79 | Temp 97.9°F | Ht 69.0 in | Wt 193.5 lb

## 2012-01-27 DIAGNOSIS — J45909 Unspecified asthma, uncomplicated: Secondary | ICD-10-CM

## 2012-01-27 DIAGNOSIS — J209 Acute bronchitis, unspecified: Secondary | ICD-10-CM

## 2012-01-27 DIAGNOSIS — R059 Cough, unspecified: Secondary | ICD-10-CM

## 2012-01-27 DIAGNOSIS — J019 Acute sinusitis, unspecified: Secondary | ICD-10-CM

## 2012-01-27 DIAGNOSIS — D6851 Activated protein C resistance: Secondary | ICD-10-CM | POA: Insufficient documentation

## 2012-01-27 DIAGNOSIS — R05 Cough: Secondary | ICD-10-CM

## 2012-01-27 MED ORDER — PSEUDOEPH-CHLORPHEN-HYDROCOD 60-4-5 MG/5ML PO SOLN: 5.0000 mL | Freq: Four times a day (QID) | ORAL | Status: DC | PRN

## 2012-01-27 MED ORDER — LEVOFLOXACIN 250 MG PO TABS: 250.0000 mg | ORAL_TABLET | Freq: Every day | ORAL | Status: AC

## 2012-01-27 NOTE — Patient Instructions (Signed)
Take all new medications as prescribed Continue all other medications as before You can also take Mucinex (or it's generic off brand) for congestion Please return in 6 mo with Lab testing done 3-5 days before    

## 2012-01-27 NOTE — Assessment & Plan Note (Signed)
Mild to mod, for antibx course,  to f/u any worsening symptoms or concerns 

## 2012-01-27 NOTE — Progress Notes (Signed)
  Subjective:    Patient ID: Robin Ayers, female    DOB: October 30, 1976, 36 y.o.   MRN: [id number]  HPI   Here with 3 days acute onset fever, facial pain, pressure, general weakness and malaise, and greenish d/c, with slight ST, but little to no cough and Pt denies chest pain, increased sob or doe, wheezing, orthopnea, PND, increased LE swelling, palpitations, dizziness or syncope.  Pt denies new neurological symptoms such as new headache, or facial or extremity weakness or numbness   Pt denies polydipsia, polyuria.   Past Medical History  Diagnosis Date  . DYSLIPIDEMIA 06/24/2009  . ALLERGIC RHINITIS 10/26/2007  . ASTHMA 04/10/2010  . Factor V Leiden mutation     hx of  . Ovarian cyst    Past Surgical History  Procedure Date  . Tonsillectomy and adenoidectomy     reports that she has quit smoking. She does not have any smokeless tobacco history on file. She reports that she does not drink alcohol or use illicit drugs. family history includes Diabetes in her other; Heart attack (age of onset:35) in her mother; Heart disease in her mother; and Hypertension in her other. Allergies  Allergen Reactions  . Clarithromycin     REACTION: GI upset  . Penicillins     REACTION: rash   Current Outpatient Prescriptions on File Prior to Visit  Medication Sig Dispense Refill  . aspirin 325 MG EC tablet Take 325 mg by mouth daily as needed.        Marland Kitchen guaiFENesin (MUCINEX) 600 MG 12 hr tablet Take 1,200 mg by mouth 2 (two) times daily as needed.        . levalbuterol (XOPENEX HFA) 45 MCG/ACT inhaler Inhale 2 puffs into the lungs every 6 (six) hours as needed for wheezing or shortness of breath.  1 Inhaler  11  . mometasone-formoterol (DULERA) 100-5 MCG/ACT AERO Inhale 2 puffs into the lungs 2 (two) times daily.  1 Inhaler  11  . montelukast (SINGULAIR) 10 MG tablet Take 1 tablet (10 mg total) by mouth at bedtime.  90 tablet  3  . simvastatin (ZOCOR) 40 MG tablet Take 1 tablet (40 mg total) by mouth  daily.  90 tablet  2   Review of Systems All otherwise neg per pt     Objective:   Physical Exam BP 110/68  Pulse 79  Temp(Src) 97.9 F (36.6 C) (Oral)  Ht 5\' 9"  (1.753 m)  Wt 193 lb 8 oz (87.771 kg)  BMI 28.57 kg/m2  SpO2 98% Physical Exam  VS noted, mild ill Constitutional: Pt appears well-developed and well-nourished.  HENT: Head: Normocephalic.  Right Ear: External ear normal.  Left Ear: External ear normal.  Bilat tm's mild erythema.  Sinus tender bilat.  Pharynx mild erythema Eyes: Conjunctivae and EOM are normal. Pupils are equal, round, and reactive to light.  Neck: Normal range of motion. Neck supple.  Cardiovascular: Normal rate and regular rhythm.   Pulmonary/Chest: Effort normal and breath sounds normal.  Neurological: Pt is alert. No cranial nerve deficit.  Skin: Skin is warm. No erythema.  Psychiatric: Pt behavior is normal. Thought content normal.     Assessment & Plan:

## 2012-01-27 NOTE — Assessment & Plan Note (Signed)
stable overall by hx and exam, most recent data reviewed with pt, and pt to continue medical treatment as before  SpO2 Readings from Last 3 Encounters:  01/27/12 98%  10/28/11 97%  10/09/11 97%

## 2012-03-30 ENCOUNTER — Other Ambulatory Visit: Payer: Self-pay | Admitting: Obstetrics and Gynecology

## 2012-05-12 ENCOUNTER — Encounter (HOSPITAL_COMMUNITY)
Admission: RE | Admit: 2012-05-12 | Discharge: 2012-05-12 | Disposition: A | Discharge status: Home / Self Care | Payer: BC Managed Care – PPO | Source: Ambulatory Visit | Attending: Obstetrics and Gynecology | Admitting: Obstetrics and Gynecology

## 2012-05-12 ENCOUNTER — Encounter (HOSPITAL_COMMUNITY): Payer: Self-pay

## 2012-05-12 ENCOUNTER — Encounter (HOSPITAL_COMMUNITY): Payer: Self-pay | Admitting: Pharmacist

## 2012-05-12 HISTORY — DX: Family history of other specified conditions: Z84.89

## 2012-05-12 HISTORY — DX: Disease of blood and blood-forming organs, unspecified: D75.9

## 2012-05-12 HISTORY — DX: Other specified postprocedural states: Z98.890

## 2012-05-12 HISTORY — DX: Nausea with vomiting, unspecified: R11.2

## 2012-05-12 LAB — CBC
MCV: 86.8 fL (ref 78.0–100.0)
Platelets: 257 10*3/uL (ref 150–400)
RDW: 12.9 % (ref 11.5–15.5)
WBC: 7.1 10*3/uL (ref 4.0–10.5)

## 2012-05-12 LAB — COMPREHENSIVE METABOLIC PANEL
ALT: 12 U/L (ref 0–35)
AST: 14 U/L (ref 0–37)
Albumin: 3.9 g/dL (ref 3.5–5.2)
Calcium: 9 mg/dL (ref 8.4–10.5)
Chloride: 103 mEq/L (ref 96–112)
Creatinine, Ser: 0.91 mg/dL (ref 0.50–1.10)
Sodium: 137 mEq/L (ref 135–145)
Total Bilirubin: 0.3 mg/dL (ref 0.3–1.2)

## 2012-05-12 LAB — SURGICAL PCR SCREEN: MRSA, PCR: NEGATIVE

## 2012-05-12 LAB — PROTIME-INR: INR: 0.87 (ref 0.00–1.49)

## 2012-05-12 NOTE — Patient Instructions (Addendum)
YOUR PROCEDURE IS SCHEDULED ON:05/27/15  ENTER THROUGH THE MAIN ENTRANCE OF [facility] AT:6am  USE DESK PHONE AND DIAL [id number] TO INFORM us OF YOUR ARRIVAL  CALL [phone] IF YOU HAVE ANY QUESTIONS OR PROBLEMS PRIOR TO YOUR ARRIVAL.  REMEMBER: DO NOT EAT OR DRINK AFTER MIDNIGHT :Wed    YOU MAY BRUSH YOUR TEETH THE MORNING OF SURGERY   TAKE THESE MEDICINES THE DAY OF SURGERY WITH SIP OF WATER:am meds   DO NOT WEAR JEWELRY, EYE MAKEUP, LIPSTICK OR DARK FINGERNAIL POLISH DO NOT WEAR LOTIONS  DO NOT SHAVE FOR 48 HOURS PRIOR TO SURGERY  YOU WILL NOT BE ALLOWED TO DRIVE YOURSELF HOME.  NAME OF DRIVER:Jason Justice

## 2012-05-25 MED ORDER — CEFAZOLIN SODIUM-DEXTROSE 2-3 GM-% IV SOLR
2.0000 g | INTRAVENOUS | Status: AC
  Administered 2012-05-26: 2 g via INTRAVENOUS
  Filled 2012-05-25: qty 50

## 2012-05-25 NOTE — H&P (Signed)
Robin Ayers is an 36 y.o. female. G4 M0102 with a history of progressive dysmenorrhea and left lower quadrant pain. In addition she has been assessed for genuine stress incontinence and the evaluation proved to be positive. Ultrasound evaluation in November 2012 revealed a simple cyst on the left but the pain has persisted even though the cyst has resolved. Because of the persistent dysmenorrhea and the stress incontinence she is being admitted to undergo a laparoscopic hysterectomy and left salpingo oophorectomy as well as a TVT sling procedure by Dr. Henderson Cloud. The patient's last pap smear in November was normal. The patient has had a deep vein thrombosis while on birth control pills at age 65 and evaluation as to the etiology revealed the presence of Factor V Leiden. She has also had a history of CIN 1.  Pertinent Gynecological History: Menses: flow is moderate Sexually transmitted diseases: no past history Previous GYN Procedures: colposcopy  Last pap: normal Date: 10/2011 Contraception: Vasectomy    Past Medical History  Diagnosis Date  . DYSLIPIDEMIA 06/24/2009  . ALLERGIC RHINITIS 10/26/2007  . ASTHMA 04/10/2010  . Factor V Leiden mutation     hx of  . Ovarian cyst   . Blood dyscrasia     factor V Leiden   . PONV (postoperative nausea and vomiting)   . Family history of anesthesia complication     PONV    Past Surgical History  Procedure Date  . Tonsillectomy and adenoidectomy   . Wisdom tooth extraction     Family History  Problem Relation Age of Onset  . Heart disease Mother   . Heart attack Mother 87  . Diabetes Other   . Hypertension Other     Social History:  reports that she has quit smoking. She does not have any smokeless tobacco history on file. She reports that she does not drink alcohol or use illicit drugs.  Allergies:  Allergies  Allergen Reactions  . Clarithromycin     REACTION: GI upset  . Penicillins     REACTION: rash    No prescriptions  prior to admission    ROS  Respiratory: no cough,no shortness of breath, positive history of asthma GI: no nausea, vomiting diarrhea or constipation GU: looses urine with cough,sneeze and stress, denies dysuria, frequency or urgency Gyn: cycles are regular but are associated with severe dysmenorrhea. No discharge or itch  There were no vitals taken for this visit. Physical Exam  Head: Normocephalic and atraumatic Eyes: Perrla, sclera non icteric Neck: Supple no JVD no thyromegaly Chest: clear to P&A Heart: regular rhythm with no murmur or gallop Abdomen: soft non tender without enlargement of the liver kidneys or spleen Pelvic:               External genitalia: within normal limits               BUS: positive for uretrocele              Vagina: grade one cystocele                Cervix: non tender               Uterus: anterior, normal size non tender                Adnexa: no definite masses were felt.  Neurologic exam: Grossly intact   No results found for this or any previous visit (from the past 24 hour(s)).  No results found.  Assessment/Plan:  Dysmenorrhea, Chronic left lower quadrant pain, Urinary stress incontinence  Factor V Leiden   Plan: Laparoscopic assisted vaginal hysterectomy, left salpingo oophorectomy, Possible abdominal hysterectomy and bilateral salpingo oophorectomy  TVT sling possible Burch procedure if abdominal hysterectomy is required.  Will place on PAS hose pre op and for 24 hours post op and then will begin on Lovenex as long as no significant  Intra operative or post operative bleeding is encountered   Takai Chiaramonte 05/25/2012, 7:16 PM

## 2012-05-26 ENCOUNTER — Encounter (HOSPITAL_COMMUNITY): Payer: Self-pay | Admitting: Anesthesiology

## 2012-05-26 ENCOUNTER — Ambulatory Visit (HOSPITAL_COMMUNITY)
Admission: RE | Admit: 2012-05-26 | Discharge: 2012-05-27 | DRG: 356 | Disposition: A | Discharge status: Home / Self Care | Payer: BC Managed Care – PPO | Source: Ambulatory Visit | Attending: Obstetrics and Gynecology | Admitting: Obstetrics and Gynecology

## 2012-05-26 ENCOUNTER — Ambulatory Visit (HOSPITAL_COMMUNITY): Payer: BC Managed Care – PPO | Admitting: Anesthesiology

## 2012-05-26 ENCOUNTER — Encounter (HOSPITAL_COMMUNITY)
Admission: RE | Disposition: A | Discharge status: Home / Self Care | Payer: Self-pay | Source: Ambulatory Visit | Attending: Obstetrics and Gynecology

## 2012-05-26 ENCOUNTER — Encounter (HOSPITAL_COMMUNITY): Payer: Self-pay | Admitting: *Deleted

## 2012-05-26 DIAGNOSIS — N803 Endometriosis of pelvic peritoneum, unspecified: Secondary | ICD-10-CM | POA: Diagnosis present

## 2012-05-26 DIAGNOSIS — J45909 Unspecified asthma, uncomplicated: Secondary | ICD-10-CM

## 2012-05-26 DIAGNOSIS — R1032 Left lower quadrant pain: Secondary | ICD-10-CM | POA: Diagnosis present

## 2012-05-26 DIAGNOSIS — N393 Stress incontinence (female) (male): Secondary | ICD-10-CM | POA: Diagnosis present

## 2012-05-26 DIAGNOSIS — N946 Dysmenorrhea, unspecified: Secondary | ICD-10-CM | POA: Diagnosis present

## 2012-05-26 DIAGNOSIS — Z86718 Personal history of other venous thrombosis and embolism: Secondary | ICD-10-CM

## 2012-05-26 DIAGNOSIS — D6859 Other primary thrombophilia: Secondary | ICD-10-CM | POA: Diagnosis present

## 2012-05-26 HISTORY — PX: LAPAROSCOPIC ASSISTED VAGINAL HYSTERECTOMY: SHX5398

## 2012-05-26 HISTORY — PX: SALPINGOOPHORECTOMY: SHX82

## 2012-05-26 HISTORY — PX: CYSTOCELE REPAIR: SHX163

## 2012-05-26 HISTORY — PX: BLADDER SUSPENSION: SHX72

## 2012-05-26 HISTORY — PX: CYSTOSCOPY: SHX5120

## 2012-05-26 LAB — HEMOGLOBIN: Hemoglobin: 11.5 g/dL — ABNORMAL LOW (ref 12.0–15.0)

## 2012-05-26 LAB — PREGNANCY, URINE: Preg Test, Ur: NEGATIVE

## 2012-05-26 SURGERY — HYSTERECTOMY, VAGINAL, LAPAROSCOPY-ASSISTED
Anesthesia: General | Site: Vagina | Wound class: Clean Contaminated

## 2012-05-26 MED ORDER — ESTRADIOL 0.1 MG/GM VA CREA
TOPICAL_CREAM | VAGINAL | Status: DC | PRN
  Administered 2012-05-26: 1 via VAGINAL

## 2012-05-26 MED ORDER — MOMETASONE FURO-FORMOTEROL FUM 100-5 MCG/ACT IN AERO
2.0000 | INHALATION_SPRAY | Freq: Two times a day (BID) | RESPIRATORY_TRACT | Status: DC
  Administered 2012-05-26 – 2012-05-27 (×2): 2 via RESPIRATORY_TRACT
  Filled 2012-05-26: qty 13

## 2012-05-26 MED ORDER — NALOXONE HCL 0.4 MG/ML IJ SOLN: 0.4000 mg | INTRAMUSCULAR | Status: DC | PRN

## 2012-05-26 MED ORDER — PROMETHAZINE HCL 25 MG/ML IJ SOLN
6.2500 mg | INTRAMUSCULAR | Status: DC | PRN
  Administered 2012-05-26: 6.25 mg via INTRAVENOUS

## 2012-05-26 MED ORDER — MENTHOL 3 MG MT LOZG: 1.0000 | LOZENGE | OROMUCOSAL | Status: DC | PRN

## 2012-05-26 MED ORDER — HYDROMORPHONE HCL PF 1 MG/ML IJ SOLN
INTRAMUSCULAR | Status: AC
  Filled 2012-05-26: qty 1

## 2012-05-26 MED ORDER — MONTELUKAST SODIUM 10 MG PO TABS
10.0000 mg | ORAL_TABLET | Freq: Every day | ORAL | Status: DC
  Administered 2012-05-27: 10 mg via ORAL
  Filled 2012-05-26 (×3): qty 1

## 2012-05-26 MED ORDER — HYDROMORPHONE HCL PF 1 MG/ML IJ SOLN
INTRAMUSCULAR | Status: AC
  Administered 2012-05-26: 0.5 mg via INTRAVENOUS
  Filled 2012-05-26: qty 1

## 2012-05-26 MED ORDER — DIPHENHYDRAMINE HCL 50 MG/ML IJ SOLN: 12.5000 mg | Freq: Four times a day (QID) | INTRAMUSCULAR | Status: DC | PRN

## 2012-05-26 MED ORDER — PROMETHAZINE HCL 25 MG/ML IJ SOLN
INTRAMUSCULAR | Status: AC
  Administered 2012-05-26: 6.25 mg via INTRAVENOUS
  Filled 2012-05-26: qty 1

## 2012-05-26 MED ORDER — ROCURONIUM BROMIDE 100 MG/10ML IV SOLN
INTRAVENOUS | Status: DC | PRN
  Administered 2012-05-26: 5 mg via INTRAVENOUS
  Administered 2012-05-26: 10 mg via INTRAVENOUS
  Administered 2012-05-26: 20 mg via INTRAVENOUS
  Administered 2012-05-26: 35 mg via INTRAVENOUS

## 2012-05-26 MED ORDER — LIDOCAINE HCL (CARDIAC) 20 MG/ML IV SOLN
INTRAVENOUS | Status: AC
  Filled 2012-05-26: qty 5

## 2012-05-26 MED ORDER — LACTATED RINGERS IR SOLN
Status: DC | PRN
  Administered 2012-05-26: 3000 mL

## 2012-05-26 MED ORDER — INDIGOTINDISULFONATE SODIUM 8 MG/ML IJ SOLN
INTRAMUSCULAR | Status: AC
  Filled 2012-05-26: qty 5

## 2012-05-26 MED ORDER — LEVALBUTEROL TARTRATE 45 MCG/ACT IN AERO
2.0000 | INHALATION_SPRAY | Freq: Four times a day (QID) | RESPIRATORY_TRACT | Status: DC | PRN
  Filled 2012-05-26: qty 15

## 2012-05-26 MED ORDER — HYDROMORPHONE HCL PF 1 MG/ML IJ SOLN
0.2500 mg | INTRAMUSCULAR | Status: DC | PRN
  Administered 2012-05-26 (×4): 0.5 mg via INTRAVENOUS

## 2012-05-26 MED ORDER — ONDANSETRON HCL 4 MG/2ML IJ SOLN
INTRAMUSCULAR | Status: AC
  Filled 2012-05-26: qty 2

## 2012-05-26 MED ORDER — ROCURONIUM BROMIDE 50 MG/5ML IV SOLN
INTRAVENOUS | Status: AC
  Filled 2012-05-26: qty 1

## 2012-05-26 MED ORDER — MIDAZOLAM HCL 2 MG/2ML IJ SOLN
INTRAMUSCULAR | Status: AC
  Filled 2012-05-26: qty 2

## 2012-05-26 MED ORDER — LIDOCAINE HCL (CARDIAC) 20 MG/ML IV SOLN
INTRAVENOUS | Status: DC | PRN
  Administered 2012-05-26: 50 mg via INTRAVENOUS

## 2012-05-26 MED ORDER — ONDANSETRON HCL 4 MG/2ML IJ SOLN
4.0000 mg | Freq: Four times a day (QID) | INTRAMUSCULAR | Status: DC | PRN
  Administered 2012-05-26 (×3): 4 mg via INTRAVENOUS
  Filled 2012-05-26 (×2): qty 2

## 2012-05-26 MED ORDER — KETOROLAC TROMETHAMINE 30 MG/ML IJ SOLN: 15.0000 mg | Freq: Once | INTRAMUSCULAR | Status: DC | PRN

## 2012-05-26 MED ORDER — MORPHINE SULFATE (PF) 1 MG/ML IV SOLN
INTRAVENOUS | Status: DC
  Administered 2012-05-26: 6 mL via INTRAVENOUS
  Administered 2012-05-26: 10.5 mL via INTRAVENOUS
  Administered 2012-05-26: 18 mg via INTRAVENOUS
  Administered 2012-05-26: 12:00:00 via INTRAVENOUS
  Administered 2012-05-27: 3 mg via INTRAVENOUS
  Administered 2012-05-27: 6 mg via INTRAVENOUS
  Administered 2012-05-27: 01:00:00 via INTRAVENOUS
  Filled 2012-05-26 (×2): qty 25

## 2012-05-26 MED ORDER — SCOPOLAMINE 1 MG/3DAYS TD PT72
MEDICATED_PATCH | TRANSDERMAL | Status: AC
  Filled 2012-05-26: qty 1

## 2012-05-26 MED ORDER — BUPIVACAINE-EPINEPHRINE (PF) 0.5% -1:200000 IJ SOLN
INTRAMUSCULAR | Status: AC
  Filled 2012-05-26: qty 10

## 2012-05-26 MED ORDER — SODIUM CHLORIDE 0.9 % IJ SOLN: 9.0000 mL | INTRAMUSCULAR | Status: DC | PRN

## 2012-05-26 MED ORDER — MIDAZOLAM HCL 5 MG/5ML IJ SOLN
INTRAMUSCULAR | Status: DC | PRN
  Administered 2012-05-26: 2 mg via INTRAVENOUS

## 2012-05-26 MED ORDER — SCOPOLAMINE 1 MG/3DAYS TD PT72: 1.0000 | MEDICATED_PATCH | Freq: Once | TRANSDERMAL | Status: DC

## 2012-05-26 MED ORDER — STERILE WATER FOR IRRIGATION IR SOLN
Status: DC | PRN
  Administered 2012-05-26: 1000 mL via INTRAVESICAL

## 2012-05-26 MED ORDER — NEOSTIGMINE METHYLSULFATE 1 MG/ML IJ SOLN
INTRAMUSCULAR | Status: DC | PRN
  Administered 2012-05-26: 2 mg via INTRAVENOUS

## 2012-05-26 MED ORDER — BUPIVACAINE HCL (PF) 0.25 % IJ SOLN
INTRAMUSCULAR | Status: AC
  Filled 2012-05-26: qty 30

## 2012-05-26 MED ORDER — GLYCOPYRROLATE 0.2 MG/ML IJ SOLN
INTRAMUSCULAR | Status: AC
  Filled 2012-05-26: qty 1

## 2012-05-26 MED ORDER — DEXAMETHASONE SODIUM PHOSPHATE 10 MG/ML IJ SOLN
INTRAMUSCULAR | Status: AC
  Filled 2012-05-26: qty 1

## 2012-05-26 MED ORDER — IBUPROFEN 600 MG PO TABS: 600.0000 mg | ORAL_TABLET | Freq: Four times a day (QID) | ORAL | Status: DC | PRN

## 2012-05-26 MED ORDER — ESTRADIOL 0.1 MG/GM VA CREA
TOPICAL_CREAM | VAGINAL | Status: AC
  Filled 2012-05-26: qty 42.5

## 2012-05-26 MED ORDER — INDIGOTINDISULFONATE SODIUM 8 MG/ML IJ SOLN
INTRAMUSCULAR | Status: DC | PRN
  Administered 2012-05-26: 5 mL via INTRAVENOUS

## 2012-05-26 MED ORDER — GLYCOPYRROLATE 0.2 MG/ML IJ SOLN
INTRAMUSCULAR | Status: DC | PRN
  Administered 2012-05-26: 0.4 mg via INTRAVENOUS

## 2012-05-26 MED ORDER — LACTATED RINGERS IV SOLN
INTRAVENOUS | Status: DC
  Administered 2012-05-26: 11:00:00 via INTRAVENOUS
  Administered 2012-05-26: 1000 mL via INTRAVENOUS
  Administered 2012-05-26: 09:00:00 via INTRAVENOUS

## 2012-05-26 MED ORDER — OXYCODONE-ACETAMINOPHEN 5-325 MG PO TABS
1.0000 | ORAL_TABLET | ORAL | Status: DC | PRN
  Administered 2012-05-27 (×3): 2 via ORAL
  Filled 2012-05-26 (×3): qty 2

## 2012-05-26 MED ORDER — ONDANSETRON HCL 4 MG/2ML IJ SOLN
INTRAMUSCULAR | Status: DC | PRN
  Administered 2012-05-26: 4 mg via INTRAVENOUS

## 2012-05-26 MED ORDER — PROMETHAZINE HCL 25 MG/ML IJ SOLN
12.5000 mg | Freq: Four times a day (QID) | INTRAMUSCULAR | Status: DC | PRN
  Administered 2012-05-26 – 2012-05-27 (×2): 12.5 mg via INTRAVENOUS
  Filled 2012-05-26 (×2): qty 1

## 2012-05-26 MED ORDER — PROPOFOL 10 MG/ML IV EMUL
INTRAVENOUS | Status: DC | PRN
  Administered 2012-05-26: 200 mg via INTRAVENOUS

## 2012-05-26 MED ORDER — SCOPOLAMINE 1 MG/3DAYS TD PT72
1.0000 | MEDICATED_PATCH | Freq: Once | TRANSDERMAL | Status: DC
  Administered 2012-05-26: 1.5 mg via TRANSDERMAL

## 2012-05-26 MED ORDER — FENTANYL CITRATE 0.05 MG/ML IJ SOLN
INTRAMUSCULAR | Status: AC
  Filled 2012-05-26: qty 5

## 2012-05-26 MED ORDER — DIPHENHYDRAMINE HCL 12.5 MG/5ML PO ELIX
12.5000 mg | ORAL_SOLUTION | Freq: Four times a day (QID) | ORAL | Status: DC | PRN
  Filled 2012-05-26: qty 5

## 2012-05-26 MED ORDER — FENTANYL CITRATE 0.05 MG/ML IJ SOLN
INTRAMUSCULAR | Status: DC | PRN
  Administered 2012-05-26: 100 ug via INTRAVENOUS
  Administered 2012-05-26: 50 ug via INTRAVENOUS
  Administered 2012-05-26: 100 ug via INTRAVENOUS

## 2012-05-26 MED ORDER — LACTATED RINGERS IV SOLN
INTRAVENOUS | Status: DC
  Administered 2012-05-26 (×2): via INTRAVENOUS

## 2012-05-26 MED ORDER — LIDOCAINE-EPINEPHRINE 1 %-1:100000 IJ SOLN
INTRAMUSCULAR | Status: DC | PRN
  Administered 2012-05-26: 10 mL

## 2012-05-26 MED ORDER — DEXAMETHASONE SODIUM PHOSPHATE 4 MG/ML IJ SOLN
INTRAMUSCULAR | Status: DC | PRN
  Administered 2012-05-26: 10 mg via INTRAVENOUS

## 2012-05-26 MED ORDER — PROMETHAZINE HCL 6.25 MG/5ML PO SYRP: 12.5000 mg | ORAL_SOLUTION | Freq: Four times a day (QID) | ORAL | Status: DC | PRN

## 2012-05-26 MED ORDER — HYDROMORPHONE HCL PF 1 MG/ML IJ SOLN
INTRAMUSCULAR | Status: DC | PRN
  Administered 2012-05-26: 1 mg via INTRAVENOUS

## 2012-05-26 MED ORDER — LEVALBUTEROL TARTRATE 45 MCG/ACT IN AERO: 2.0000 | INHALATION_SPRAY | Freq: Four times a day (QID) | RESPIRATORY_TRACT | Status: DC | PRN

## 2012-05-26 SURGICAL SUPPLY — 61 items
ADH SKN CLS APL DERMABOND .7 (GAUZE/BANDAGES/DRESSINGS) ×10
BLADE SURG 15 STRL LF C SS BP (BLADE) ×10 IMPLANT
BLADE SURG 15 STRL SS (BLADE) ×12
CANISTER SUCTION 2500CC (MISCELLANEOUS) ×8 IMPLANT
CATH BONANNO SUPRAPUBIC 14G (CATHETERS) IMPLANT
CLOTH BEACON ORANGE TIMEOUT ST (SAFETY) ×7 IMPLANT
CONT PATH 16OZ SNAP LID 3702 (MISCELLANEOUS) ×6 IMPLANT
COVER LIGHT HANDLE  1/PK (MISCELLANEOUS) ×1
COVER LIGHT HANDLE 1/PK (MISCELLANEOUS) ×4 IMPLANT
COVER TABLE BACK 60X90 (DRAPES) ×6 IMPLANT
DECANTER SPIKE VIAL GLASS SM (MISCELLANEOUS) ×5 IMPLANT
DERMABOND ADVANCED (GAUZE/BANDAGES/DRESSINGS) ×2
DERMABOND ADVANCED .7 DNX12 (GAUZE/BANDAGES/DRESSINGS) ×7 IMPLANT
ELECT REM PT RETURN 9FT ADLT (ELECTROSURGICAL) ×6
ELECTRODE REM PT RTRN 9FT ADLT (ELECTROSURGICAL) ×5 IMPLANT
FILTER SMOKE EVAC LAPAROSHD (FILTER) ×3 IMPLANT
FORCEPS CUTTING 33CM 5MM (CUTTING FORCEPS) IMPLANT
GAUZE PACKING 1 X5 YD ST (GAUZE/BANDAGES/DRESSINGS) IMPLANT
GAUZE PACKING 2X5 YD STERILE (GAUZE/BANDAGES/DRESSINGS) ×6 IMPLANT
GAUZE PACKING IODOFORM 1 (PACKING) IMPLANT
GAUZE PACKING IODOFORM 2 (PACKING) IMPLANT
GLOVE BIO SURGEON STRL SZ7 (GLOVE) ×12 IMPLANT
GLOVE BIO SURGEON STRL SZ7.5 (GLOVE) ×18 IMPLANT
GLOVE BIOGEL PI IND STRL 6 (GLOVE) ×5 IMPLANT
GLOVE BIOGEL PI IND STRL 6.5 (GLOVE) ×5 IMPLANT
GLOVE BIOGEL PI INDICATOR 6 (GLOVE) ×1
GLOVE BIOGEL PI INDICATOR 6.5 (GLOVE) ×1
GOWN PREVENTION PLUS LG XLONG (DISPOSABLE) ×32 IMPLANT
GOWN PREVENTION PLUS XXLARGE (GOWN DISPOSABLE) ×6 IMPLANT
GOWN STRL REIN XL XLG (GOWN DISPOSABLE) ×6 IMPLANT
NEEDLE HYPO 22GX1.5 SAFETY (NEEDLE) ×6 IMPLANT
NS IRRIG 1000ML POUR BTL (IV SOLUTION) ×7 IMPLANT
PACK LAVH (CUSTOM PROCEDURE TRAY) ×6 IMPLANT
PACK VAGINAL WOMENS (CUSTOM PROCEDURE TRAY) ×4 IMPLANT
PLUG CATH AND CAP STER (CATHETERS) ×3 IMPLANT
PROTECTOR NERVE ULNAR (MISCELLANEOUS) ×10 IMPLANT
SEALER TISSUE G2 CVD JAW 35 (ENDOMECHANICALS) ×2 IMPLANT
SEALER TISSUE G2 CVD JAW 45CM (ENDOMECHANICALS) ×1
SET CYSTO W/LG BORE CLAMP LF (SET/KITS/TRAYS/PACK) ×6 IMPLANT
SET IRRIG TUBING LAPAROSCOPIC (IRRIGATION / IRRIGATOR) ×3 IMPLANT
SLING TRANS VAGINAL TAPE (Sling) ×1 IMPLANT
SLING UTERINE/ABD GYNECARE TVT (Sling) ×5 IMPLANT
SOLUTION ELECTROLUBE (MISCELLANEOUS) ×1 IMPLANT
STRIP CLOSURE SKIN 1/4X3 (GAUZE/BANDAGES/DRESSINGS) ×10 IMPLANT
SUT CHROMIC 0 CT 1 (SUTURE) IMPLANT
SUT VIC AB 0 CT1 18XCR BRD8 (SUTURE) ×11 IMPLANT
SUT VIC AB 0 CT1 27 (SUTURE) ×12
SUT VIC AB 0 CT1 27XBRD ANBCTR (SUTURE) ×14 IMPLANT
SUT VIC AB 0 CT1 8-18 (SUTURE) ×12
SUT VIC AB 2-0 CT1 27 (SUTURE)
SUT VIC AB 2-0 CT1 TAPERPNT 27 (SUTURE) IMPLANT
SUT VIC AB 2-0 SH 27 (SUTURE) ×12
SUT VIC AB 2-0 SH 27XBRD (SUTURE) ×6 IMPLANT
SUT VIC AB 2-0 UR6 27 (SUTURE) IMPLANT
SUT VIC AB 3-0 X1 27 (SUTURE) ×6 IMPLANT
SUT VICRYL 1 TIES 12X18 (SUTURE) ×6 IMPLANT
SYR 50ML LL SCALE MARK (SYRINGE) IMPLANT
TOWEL OR 17X24 6PK STRL BLUE (TOWEL DISPOSABLE) ×20 IMPLANT
TRAY FOLEY CATH 14FR (SET/KITS/TRAYS/PACK) ×7 IMPLANT
WARMER LAPAROSCOPE (MISCELLANEOUS) ×6 IMPLANT
WATER STERILE IRR 1000ML POUR (IV SOLUTION) ×2 IMPLANT

## 2012-05-26 NOTE — Anesthesia Procedure Notes (Signed)
Procedure Name: Intubation Date/Time: 05/26/2012 7:42 AM Performed by: Isabella Bowens R Pre-anesthesia Checklist: Patient identified, Emergency Drugs available, Suction available, Patient being monitored and Timeout performed Patient Re-evaluated:Patient Re-evaluated prior to inductionOxygen Delivery Method: Circle system utilized Preoxygenation: Pre-oxygenation with 100% oxygen Intubation Type: IV induction Ventilation: Mask ventilation without difficulty Laryngoscope Size: Mac and 3 Grade View: Grade III Tube type: Oral Tube size: 7.0 mm Number of attempts: 2 Airway Equipment and Method: Video-laryngoscopy Placement Confirmation: ETT inserted through vocal cords under direct vision,  positive ETCO2 and breath sounds checked- equal and bilateral Secured at: 21 cm Tube secured with: Tape Dental Injury: Teeth and Oropharynx as per pre-operative assessment  Difficulty Due To: Difficulty was anticipated

## 2012-05-26 NOTE — Discharge Instructions (Addendum)
Keep Foley in place- give patient leg bag and show how to change bags.  Instruct on care.    voiding trial at office- call and come in at 830 am on Monday.

## 2012-05-26 NOTE — Brief Op Note (Signed)
05/26/2012  9:44 AM  PATIENT:  Robin Ayers  36 y.o. female  PRE-OPERATIVE DIAGNOSIS:  MENORRHAGIA DYSMENORRHEA SUI  /  LLQ PAIN  POST-OPERATIVE DIAGNOSIS:  MENORRHAGIA DYSMENORRHEA  PROCEDURE:  Procedure(s) (LRB): LAPAROSCOPIC ASSISTED VAGINAL HYSTERECTOMY (N/A) SALPINGO OOPHERECTOMY (Left) TRANSVAGINAL TAPE (TVT) PROCEDURE (N/A) CYSTOSCOPY (N/A) ANTERIOR REPAIR (CYSTOCELE) (N/A)  SURGEON:  Surgeon(s) and Role: Panel 1:    * Miguel Aschoff, MD - Primary  Panel 2:    * Loney Laurence, MD - Primary  PHYSICIAN ASSISTANT:   ASSISTANTS:M Henderson Cloud for LAVH LSO                          A Cristal Qadir for anterior repair and TVT sling  ANESTHESIA:   general  EBL:  Total I/O In: 1000 [I.V.:1000] Out: 250 [Blood:250]  BLOOD ADMINISTERED:none  DRAINS: Urinary Catheter (Foley)   LOCAL MEDICATIONS USED:  LIDOCAINE   SPECIMEN:  Source of Specimen:  Uterus left tube and left ovary  DISPOSITION OF SPECIMEN:  PATHOLOGY  COUNTS:  YES  TOURNIQUET:  * No tourniquets in log *  DICTATION: .Other Dictation: Dictation Number [id number]  PLAN OF CARE: Admit to inpatient   PATIENT DISPOSITION:  PACU - hemodynamically stable.   Delay start of Pharmacological VTE agent (>24hrs) due to surgical blood loss or risk of bleeding: yes

## 2012-05-26 NOTE — Anesthesia Preprocedure Evaluation (Addendum)
Anesthesia Evaluation  Patient identified by MRN, date of birth, ID band Patient awake    Reviewed: Allergy & Precautions, H&P , NPO status , Patient's Chart, lab work & pertinent test results  History of Anesthesia Complications (+) PONV  Airway Mallampati: III TM Distance: >3 FB Neck ROM: full    Dental No notable dental hx. (+) Teeth Intact   Pulmonary asthma ,  breath sounds clear to auscultation  Pulmonary exam normal       Cardiovascular negative cardio ROS  Rhythm:regular Rate:Normal     Neuro/Psych negative psych ROS   GI/Hepatic negative GI ROS, Neg liver ROS,   Endo/Other  negative endocrine ROSHyperlipidemia  Renal/GU negative Renal ROS   SUI    Musculoskeletal negative musculoskeletal ROS (+)   Abdominal Normal abdominal exam  (+)   Peds  Hematology Factor V Leiden   Anesthesia Other Findings   Reproductive/Obstetrics negative OB ROS                          Anesthesia Physical Anesthesia Plan  ASA: II  Anesthesia Plan: General ETT   Post-op Pain Management:    Induction:   Airway Management Planned:   Additional Equipment:   Intra-op Plan:   Post-operative Plan:   Informed Consent: I have reviewed the patients History and Physical, chart, labs and discussed the procedure including the risks, benefits and alternatives for the proposed anesthesia with the patient or authorized representative who has indicated his/her understanding and acceptance.   Dental Advisory Given  Plan Discussed with: Anesthesiologist, CRNA and Surgeon  Anesthesia Plan Comments:        Anesthesia Quick Evaluation

## 2012-05-26 NOTE — Brief Op Note (Signed)
05/26/2012  9:27 AM  PATIENT:  Robin Ayers  36 y.o. female  PRE-OPERATIVE DIAGNOSIS:  MENORRHAGIA DYSMENORRHEA SUI  /  LLQ PAIN  POST-OPERATIVE DIAGNOSIS:  MENORRHAGIA DYSMENORRHEA, SUI and cystocele  PROCEDURE:  Procedure(s) (LRB): ANTERIOR (CYSTOCELE) AND POSTERIOR REPAIR (RECTOCELE) (N/A) TRANSVAGINAL TAPE (TVT) PROCEDURE (N/A) CYSTOSCOPY (N/A) ANTERIOR REPAIR (CYSTOCELE) (N/A)  SURGEON:  Surgeon(s) and Role:   Panel 2:    * Loney Laurence, MD - Primary   ASSISTANTS: Dr. Miguel Aschoff   ANESTHESIA:   general  EBL:  Total I/O In: 1000 [I.V.:1000] Out: 250 [Blood:250]  SPECIMEN:  No Specimen  DISPOSITION OF SPECIMEN:  N/A  COUNTS:  YES  PLAN OF CARE: Admit for overnight observation  PATIENT DISPOSITION:  PACU - hemodynamically stable.   Delay start of Pharmacological VTE agent (>24hrs) due to surgical blood loss or risk of bleeding: not applicable  After general anesthesia was administered, the patient was prepped and draped in the usual sterile fashion. After Dr. Tenny Craw did LAVH and LSO,  the foley was kept in place  and the apex of the vaginal cuff was grasped with two allises.  Allises were used to grasp the vaginal mucosa in the midline superior to inferior just below the urethral opening.  The mucosa was injected with 1% lidocaine with epi in the midline and a scalpel used to incise the vaginal mucosa vertically.  Allises were used to retract the vaginal mucosa as the vesico-vaginal fascia was removed from the mucosa with blunt and sharp dissection and adequate room midurethral to pelvic floor bilaterally was developed.  Two small puncture incisions were then made two cm from midline just above the pubic symphysis.  The abdominal needles from the Gynecare TVT set were introduced behind the pubic bone, through the pelvic floor and out the vagina carefully on each respective side, being careful to hug the posterior of the pubic bone.  The foley was removed  and indigo carmine given.  Cystoscopy revealed excellent bilateral spill from each ureteral orifice and NO NEEDLES IN THE BLADDER.   The urethra was clear as well.  The foley was replaced and the vaginal needles attached to the abdominal needles.  The tape was pulled into place through the abdominal incisions, the sheaths removed while a kelly was in place under the mesh sling and the tape cut at the level of just below the skin.  The tape was checked and found to be tight enough and laying flat.  Once hemostasis was achieved, two mattress stitches of 0-vicryl were placed to close the vesico-vaginal fascia under the bladder.  The vaginal mucosa was trimmed and then closed with a running lock stitch of 2-0 vicryl.  I then closed the vaginal cuff with 3 figure of eight 0 vicryl stitches. A vaginal packing with estrace was placed and Dr. Tenny Craw then finished the laparoscopy and closure from above, including putting Dermabond on the two suprapubic incisions.  Trinisha Paget A

## 2012-05-26 NOTE — Addendum Note (Signed)
Addendum  created 05/26/12 1550 by Graciela Husbands, CRNA   Modules edited:Notes Section

## 2012-05-26 NOTE — Anesthesia Postprocedure Evaluation (Signed)
Anesthesia Post Note  Patient: Robin Ayers  Procedure(s) Performed: Procedure(s) (LRB): LAPAROSCOPIC ASSISTED VAGINAL HYSTERECTOMY (N/A) SALPINGO OOPHERECTOMY (Left) TRANSVAGINAL TAPE (TVT) PROCEDURE (N/A) CYSTOSCOPY (N/A) ANTERIOR REPAIR (CYSTOCELE) (N/A)  Anesthesia type: General  Patient location: PACU  Post pain: Pain level controlled  Post assessment: Post-op Vital signs reviewed  Last Vitals:  Filed Vitals:   05/26/12 1115  BP: 111/68  Pulse: 80  Temp: 36.4 C  Resp: 16    Post vital signs: Reviewed  Level of consciousness: sedated  Complications: No apparent anesthesia complications

## 2012-05-26 NOTE — Anesthesia Postprocedure Evaluation (Signed)
  Anesthesia Post-op Note  Patient: Robin Ayers  Procedure(s) Performed: Procedure(s) (LRB): LAPAROSCOPIC ASSISTED VAGINAL HYSTERECTOMY (N/A) SALPINGO OOPHERECTOMY (Left) TRANSVAGINAL TAPE (TVT) PROCEDURE (N/A) CYSTOSCOPY (N/A) ANTERIOR REPAIR (CYSTOCELE) (N/A)  Patient Location: [facility]  Anesthesia Type: General  Level of Consciousness: awake, alert  and oriented  Airway and Oxygen Therapy: Patient Spontanous Breathing and Patient connected to nasal cannula oxygen  Post-op Pain: mild  Post-op Assessment: Post-op Vital signs reviewed, Patient's Cardiovascular Status Stable and NAUSEA AND VOMITING PRESENT  Post-op Vital Signs: Reviewed and stable  Complications: No apparent anesthesia complications

## 2012-05-26 NOTE — Transfer of Care (Signed)
Immediate Anesthesia Transfer of Care Note  Patient: Robin Ayers  Procedure(s) Performed: Procedure(s) (LRB): LAPAROSCOPIC ASSISTED VAGINAL HYSTERECTOMY (N/A) SALPINGO OOPHERECTOMY (Left) TRANSVAGINAL TAPE (TVT) PROCEDURE (N/A) CYSTOSCOPY (N/A) ANTERIOR REPAIR (CYSTOCELE) (N/A)  Patient Location: PACU  Anesthesia Type: General  Level of Consciousness: awake and oriented  Airway & Oxygen Therapy: Patient Spontanous Breathing and Patient connected to nasal cannula oxygen  Post-op Assessment: Report given to PACU RN and Post -op Vital signs reviewed and stable  Post vital signs: Reviewed and stable  Complications: No apparent anesthesia complications

## 2012-05-26 NOTE — Op Note (Signed)
05/26/2012  9:27 AM  PATIENT:  Robin Ayers  35 y.o. female  PRE-OPERATIVE DIAGNOSIS:  MENORRHAGIA DYSMENORRHEA SUI  /  LLQ PAIN  POST-OPERATIVE DIAGNOSIS:  MENORRHAGIA DYSMENORRHEA, SUI and cystocele  PROCEDURE:  Procedure(s) (LRB): ANTERIOR (CYSTOCELE) AND POSTERIOR REPAIR (RECTOCELE) (N/A) TRANSVAGINAL TAPE (TVT) PROCEDURE (N/A) CYSTOSCOPY (N/A) ANTERIOR REPAIR (CYSTOCELE) (N/A)  SURGEON:  Surgeon(s) and Role:   Panel 2:    * Melaina Howerton A Lavelle Berland, MD - Primary   ASSISTANTS: Dr. Allan Ross   ANESTHESIA:   general  EBL:  Total I/O In: 1000 [I.V.:1000] Out: 250 [Blood:250]  SPECIMEN:  No Specimen  DISPOSITION OF SPECIMEN:  N/A  COUNTS:  YES  PLAN OF CARE: Admit for overnight observation  PATIENT DISPOSITION:  PACU - hemodynamically stable.   Delay start of Pharmacological VTE agent (>24hrs) due to surgical blood loss or risk of bleeding: not applicable  After general anesthesia was administered, the patient was prepped and draped in the usual sterile fashion. After Dr. Ross did LAVH and LSO,  the foley was kept in place  and the apex of the vaginal cuff was grasped with two allises.  Allises were used to grasp the vaginal mucosa in the midline superior to inferior just below the urethral opening.  The mucosa was injected with 1% lidocaine with epi in the midline and a scalpel used to incise the vaginal mucosa vertically.  Allises were used to retract the vaginal mucosa as the vesico-vaginal fascia was removed from the mucosa with blunt and sharp dissection and adequate room midurethral to pelvic floor bilaterally was developed.  Two small puncture incisions were then made two cm from midline just above the pubic symphysis.  The abdominal needles from the Gynecare TVT set were introduced behind the pubic bone, through the pelvic floor and out the vagina carefully on each respective side, being careful to hug the posterior of the pubic bone.  The foley was removed  and indigo carmine given.  Cystoscopy revealed excellent bilateral spill from each ureteral orifice and NO NEEDLES IN THE BLADDER.   The urethra was clear as well.  The foley was replaced and the vaginal needles attached to the abdominal needles.  The tape was pulled into place through the abdominal incisions, the sheaths removed while a kelly was in place under the mesh sling and the tape cut at the level of just below the skin.  The tape was checked and found to be tight enough and laying flat.  Once hemostasis was achieved, two mattress stitches of 0-vicryl were placed to close the vesico-vaginal fascia under the bladder.  The vaginal mucosa was trimmed and then closed with a running lock stitch of 2-0 vicryl.  I then closed the vaginal cuff with 3 figure of eight 0 vicryl stitches. A vaginal packing with estrace was placed and Dr. Ross then finished the laparoscopy and closure from above, including putting Dermabond on the two suprapubic incisions.  Ainsley Deakins A        

## 2012-05-26 NOTE — Op Note (Signed)
NAME:  Robin Ayers, Robin Ayers      ACCOUNT NO.:  [id number]  MEDICAL RECORD NO.:  [id number]  LOCATION:  [facility]                          FACILITY:  [facility]  PHYSICIAN:  Miguel Aschoff, M.D.       DATE OF BIRTH:  06-Mar-1976  DATE OF PROCEDURE:  05/26/2012 DATE OF DISCHARGE:                              OPERATIVE REPORT   PREOPERATIVE DIAGNOSES: 1. Dysmenorrhea. 2. Chronic left-sided pelvic pain. 3. Suspected endometriosis. 4. Genuine urinary stress incontinence.  PROCEDURE:  Laparoscopically-assisted vaginal hysterectomy and left salpingo-oophorectomy.  SURGEON:  Miguel Aschoff, MD.  ANESTHESIA:  General.  ASSISTANT:  Carrington Clamp, MD.  COMPLICATIONS:  None.  JUSTIFICATION:  The patient is a 36 year old white female with history of progressively worsening dysmenorrhea.  In addition to the dysmenorrhea, she has had persistent left lower quadrant pain and prior history of ovarian cyst.  Because of progressive symptomatology, the patient has requested that a procedure be carried out to stop the dysmenorrhea and left lower quadrant pain.  In addition, she had been previously evaluated with urodynamic studies and has confirmed the urinary stress incontinence following the hysterectomy and anterior repairs to be carried out as well as a TVT sling by Dr. Henderson Cloud.  This is dictated as a separate note.  The risks and benefits of procedure were discussed with the patient and informed consent has been obtained.  PROCEDURE IN DETAIL:  The patient was taken to the operating room, placed in a supine position, and general anesthesia was administered without difficulty.  She was then placed in the dorsal lithotomy position, prepped and draped in usual sterile fashion.  Foley catheter was inserted.  A Hulka tenaculum was placed through the cervix and held. At this point, attention was directed to the umbilicus where a small infraumbilical incision was made.  A Veress needle was inserted and  then the abdomen was insufflated with 3 L of CO2.  Following this, the trocar to laparoscope was placed followed by laparoscope itself.  At this point, under direct visualization, two accessory ports were established in the right and left lower quadrant.  These were 5-mm ports.  Once this was done, inspection of the pelvic organs showed the uterus to be globular, top-normal size.  No adnexal masses were noted.  In the cul-de- sac however, there were characteristic implants of endometriosis noted on the uterosacral ligaments.  No endometriosis was noted on the ovaries.  The appendix appeared to be normal.  The upper abdomen was unremarkable, it appeared to be within normal limits.  The gallbladder appeared to be noninflamed.  At this point, the uterus was elevated with a Hulka tenaculum.  The left infundibulopelvic ligament was identified and then using an enterocele unit, the infundibulopelvic ligament was cauterized and then cut and then dissection continued medially towards the coronal portion of the uterus along the mesosalpinx and mesovarium ligaments.  These were treated serially with cauterizations and cuts until the utero-ovarian ligament junction at the uterus was reached. The round ligament was then identified, grasped, cauterized, and cut. Then, dissection continued along the broad ligament just lateral to the uterus until the level of the uterine vessels was reached.  At this point, attention was directed to the right portion.  The right ovary  appeared to be normal and this ovary was conserved.  The utero- ovarian ligament was grasped with the enseal unit, cauterized and cut. The fallopian tube, similarly grasped, cauterized, and then cut and then the round ligament again.  Serial bites were taken with the enseal unit, cauterization carried out, and then these pedicles cut.  This continued to the level of the uterine vessels.  At this point, it was elected to proceed with  vaginal portion of the procedure.  The patient was taken out of Trendelenburg, placed back in high lithotomy position.  Speculum was placed in the vaginal vault.  The cervix was then injected with 6 mL of 1% Xylocaine with epinephrine for hemostasis and the cervical mucosa was circumscribed and dissected anteriorly and posteriorly until the peritoneal reflections were found. The peritoneum was then entered posteriorly and a speculum placed.  At this point, the uterosacral ligaments were grasped with curved Heaney clamps.  These pedicles were then cut and suture ligated using suture ligatures of 0 Vicryl.  The cardinal ligaments were clamped, cut, and suture ligated in a similar fashion.  All sutures being suture ligatures of 0 Vicryl.  Additional bites were taken of paracervical fascia using curved Heaney clamps.  Again, all pedicles were suture ligated using suture ligatures of 0 Vicryl.  The dissection continued along the broad ligament until it was possible to deliver the specimen vaginally.  The specimen consisting of the cervix, uterus, left tube, and ovary.  At this point, the posterior vaginal cuff was closed using running interlocking 0 Vicryl suture.  Inspection made for hemostasis. Hemostasis appeared to be excellent at this point.  Lap counts were then taken and found to be correct and then the peritoneum was closed using a pursestring suture of 0 Vicryl.  At this point, inspection was made back on the abdomen.  Gloves were changed.  The abdomen was reinsufflated to ensure there was a good hemostasis and this proved to be the case.  With this being the case, the remainder of procedure was carried out.  This was done by Dr. Henderson Cloud and is dictated as a separate note.  On completion of her anterior repair and TVT sling procedure, the CO2 and all instruments associated with laparoscopy were removed.  Again lap counts and instrument counts were taken and found to be correct, and  then the small incisions on the abdomen were all closed using subcuticular 4-0 Vicryl. Steri-Strips were applied.  The estimated blood loss from the procedure was approximately 350 mL. The patient tolerated the procedure well and went to the recovery room in satisfactory condition.  The patient is planned to be admitted as an inpatient.     Miguel Aschoff, M.D.     AR/MEDQ  D:  05/26/2012  T:  05/26/2012  Job:  [id number]

## 2012-05-26 NOTE — H&P (Signed)
Status unchanged will proceed with planned LAVH LSO and TVT

## 2012-05-27 ENCOUNTER — Encounter (HOSPITAL_COMMUNITY): Payer: Self-pay | Admitting: Obstetrics and Gynecology

## 2012-05-27 LAB — CBC
HCT: 31.7 % — ABNORMAL LOW (ref 36.0–46.0)
MCHC: 33.4 g/dL (ref 30.0–36.0)
Platelets: 203 10*3/uL (ref 150–400)
RDW: 13.1 % (ref 11.5–15.5)
WBC: 10.2 10*3/uL (ref 4.0–10.5)

## 2012-05-27 MED ORDER — ENOXAPARIN SODIUM 40 MG/0.4ML ~~LOC~~ SOLN
40.0000 mg | SUBCUTANEOUS | Status: DC
  Administered 2012-05-27: 40 mg via SUBCUTANEOUS
  Filled 2012-05-27 (×2): qty 0.4

## 2012-05-27 NOTE — Progress Notes (Signed)
S: Doing well this afternoon but she was unable to void on her own. Catheter has been replaced  O Afebrile  BP 103/67 Pulse 96  Abdomen is soft  Impression: Stable post op.  Plan: Discharge home           Return to office on 6/24 for catheter removal           Resume pre op meds except for aspirin           Meds for home   Tylox one every 3 to 4 hours as needed for pain                                        Lovenox 40 mg daily for 7 days then to stop and resume aspirin            Regular diet            Condition improved  Pathology is pending

## 2012-05-27 NOTE — Progress Notes (Signed)
S: Doing better this AM nausea is improving. Stable though out the night.  Afebrile  BP 105/71    Abdomen  Soft non tender wounds healing well  Lab: hg  10.6  WBC 10.2  Assessment: Stable post op  Will observe today and see if she can void.  Reassess this PM  Start lovenox

## 2012-05-27 NOTE — Progress Notes (Signed)
UR Chart review completed.  

## 2012-05-27 NOTE — Progress Notes (Signed)
Pt d/c home     Teaching complete   Foley cath care reviewed   Pt can repeat  Instructions

## 2012-05-30 NOTE — Discharge Summary (Signed)
NAME:  Robin Ayers, Robin Ayers      ACCOUNT NO.:  [id number]  MEDICAL RECORD NO.:  [id number]  LOCATION:  9304                          FACILITY:  [facility]  PHYSICIAN:  Miguel Aschoff, M.D.       DATE OF BIRTH:  [birth year]  DATE OF ADMISSION:  05/26/2012 DATE OF DISCHARGE:  05/27/2012                              DISCHARGE SUMMARY   ADMISSION DIAGNOSES:  Dysmenorrhea, chronic left lower quadrant pain.  FINAL DIAGNOSES:  Dysmenorrhea, chronic left lower quadrant pain.  OPERATIONS AND PROCEDURES:  Laparoscopically-assisted vaginal hysterectomy and left salpingo-oophorectomy, and tension-free vaginal tape sling.  BRIEF HISTORY:  The patient is a 36 year old, white female, gravida 4, para 2-0-2-2, with a history of progressive dysmenorrhea and chronic left lower quadrant pain.  In addition, the patient had the complaint of genuine urinary stress incontinence which had been confirmed the urodynamic testing.  Because of her persistent symptomatology and progressive dysmenorrhea, the desire for this to be dealt with an extended basis.  She was counseled as to the options and has decided to undergo laparoscopic vaginal hysterectomy and because of the chronic left lower quadrant pain, left salpingo-oophorectomy.  In addition, because of her urinary stress incontinence symptoms, the patient requested that this be treated at the time of the treatment for her dysmenorrhea and she is scheduled to undergo a TVT sling procedure by Dr. Henderson Cloud.  HOSPITAL COURSE:  Preoperative studies were obtained.  Chemistry profile was essentially within normal limits.  Her admission hemoglobin was normal as was her white count.  PT and PTT were within normal limits. The only complicating factor of this patient's history is in addition, she is positive for factor V Leiden and was felt to be at increased risk for deep vein thrombosis.  Hospital course under general anesthesia on May 26, 2012, a  laparoscopically-assisted vaginal hysterectomy and left salpingo-oophorectomy were carried out without difficulty followed by the placement of a TVT sling.  The patient's postoperative course was essentially uncomplicated.  She was initially started on PAS hose in an effort to decrease her risk for thrombosis, followed by Lovenox treatment on the 1st postoperative day, which was to continue for 1 week.  By the afternoon of the 1st postoperative day, the patient was felt to be in satisfactory condition, stable enough to be discharged home.  She was sent home on Tylox 1 every 3 hours as needed for pain, Lovenox 40 mg subcu daily for 7 days.  She was instructed to return to the office on May 30, 2012, since she was not able to void spontaneously without the catheter.  Her postoperative hemoglobin was 10.1 and her vital signs remained stable.  She was afebrile.  She was sent home on a regular diet in an improved condition, told to resume any preoperative medications.  The final pathology report on the hysterectomy specimen is pending.  However, she was noted to have significant endometriosis in the cul-de-sac, the bowel, uterosacral ligaments and the space between the uterosacral ligaments.     Miguel Aschoff, M.D.     AR/MEDQ  D:  05/30/2012  T:  05/30/2012  Job:  [id number]

## 2012-09-09 ENCOUNTER — Other Ambulatory Visit (INDEPENDENT_AMBULATORY_CARE_PROVIDER_SITE_OTHER): Payer: BC Managed Care – PPO

## 2012-09-09 ENCOUNTER — Ambulatory Visit (INDEPENDENT_AMBULATORY_CARE_PROVIDER_SITE_OTHER): Payer: BC Managed Care – PPO | Admitting: Internal Medicine

## 2012-09-09 ENCOUNTER — Encounter: Payer: Self-pay | Admitting: Internal Medicine

## 2012-09-09 VITALS — BP 106/70 | HR 99 | Temp 98.0°F | Ht 69.0 in | Wt 205.2 lb

## 2012-09-09 DIAGNOSIS — J45909 Unspecified asthma, uncomplicated: Secondary | ICD-10-CM

## 2012-09-09 DIAGNOSIS — J019 Acute sinusitis, unspecified: Secondary | ICD-10-CM

## 2012-09-09 DIAGNOSIS — Z0000 Encounter for general adult medical examination without abnormal findings: Secondary | ICD-10-CM

## 2012-09-09 DIAGNOSIS — J309 Allergic rhinitis, unspecified: Secondary | ICD-10-CM

## 2012-09-09 DIAGNOSIS — Z23 Encounter for immunization: Secondary | ICD-10-CM

## 2012-09-09 LAB — LIPID PANEL
LDL Cholesterol: 83 mg/dL (ref 0–99)
Total CHOL/HDL Ratio: 3

## 2012-09-09 LAB — BASIC METABOLIC PANEL
CO2: 26 mEq/L (ref 19–32)
Chloride: 107 mEq/L (ref 96–112)
Creatinine, Ser: 0.9 mg/dL (ref 0.4–1.2)
Potassium: 4.3 mEq/L (ref 3.5–5.1)
Sodium: 138 mEq/L (ref 135–145)

## 2012-09-09 LAB — HEPATIC FUNCTION PANEL
ALT: 12 U/L (ref 0–35)
AST: 15 U/L (ref 0–37)
Alkaline Phosphatase: 84 U/L (ref 39–117)
Bilirubin, Direct: 0.1 mg/dL (ref 0.0–0.3)
Total Bilirubin: 0.5 mg/dL (ref 0.3–1.2)
Total Protein: 6.8 g/dL (ref 6.0–8.3)

## 2012-09-09 LAB — CBC WITH DIFFERENTIAL/PLATELET
Basophils Absolute: 0.1 10*3/uL (ref 0.0–0.1)
Basophils Relative: 0.6 % (ref 0.0–3.0)
Eosinophils Absolute: 0.2 10*3/uL (ref 0.0–0.7)
MCHC: 33.4 g/dL (ref 30.0–36.0)
MCV: 86.8 fl (ref 78.0–100.0)
Monocytes Absolute: 0.6 10*3/uL (ref 0.1–1.0)
Neutrophils Relative %: 79.2 % — ABNORMAL HIGH (ref 43.0–77.0)
RBC: 4.57 Mil/uL (ref 3.87–5.11)
RDW: 13.6 % (ref 11.5–14.6)

## 2012-09-09 MED ORDER — LEVOFLOXACIN 250 MG PO TABS: 250.0000 mg | ORAL_TABLET | Freq: Every day | ORAL | Status: DC

## 2012-09-09 NOTE — Assessment & Plan Note (Signed)
Mild to mod, for antibx course,  to f/u any worsening symptoms or concerns, for mucinex prn

## 2012-09-09 NOTE — Patient Instructions (Addendum)
Take all new medications as prescribed Continue all other medications as before You had the flu shot today

## 2012-09-09 NOTE — Assessment & Plan Note (Signed)
stable overall by hx and exam, most recent data reviewed with pt, and pt to continue medical treatment as before SpO2 Readings from Last 3 Encounters:  09/09/12 98%  05/27/12 96%  05/27/12 96%

## 2012-09-09 NOTE — Assessment & Plan Note (Signed)

## 2012-09-09 NOTE — Assessment & Plan Note (Signed)
stable overall by hx and exam, most recent data reviewed with pt, and pt to continue medical treatment as before Lab Results  Component Value Date   WBC 10.2 05/27/2012   HGB 10.6* 05/27/2012   HCT 31.7* 05/27/2012   PLT 203 05/27/2012   GLUCOSE 99 05/12/2012   CHOL 198 07/06/2011   TRIG 221.0* 07/06/2011   HDL 45.90 07/06/2011   LDLDIRECT 117.5 07/06/2011   LDLCALC 67 01/06/2010   ALT 12 05/12/2012   AST 14 05/12/2012   NA 137 05/12/2012   K 3.8 05/12/2012   CL 103 05/12/2012   CREATININE 0.91 05/12/2012   BUN 13 05/12/2012   CO2 28 05/12/2012   TSH 1.26 07/06/2011   INR 0.87 05/12/2012

## 2012-09-09 NOTE — Progress Notes (Signed)
Subjective:    Patient ID: Robin Ayers, female    DOB: [birth year], 36 y.o.   MRN: [id number]  HPI  Here for wellness and f/u;  Overall doing ok;  Pt denies CP, worsening SOB, DOE, wheezing, orthopnea, PND, worsening LE edema, palpitations, dizziness or syncope.  Pt denies neurological change such as new Headache, facial or extremity weakness.  Pt denies polydipsia, polyuria, or low sugar symptoms. Pt states overall good compliance with treatment and medications, good tolerability, and trying to follow lower cholesterol diet.  Pt denies worsening depressive symptoms, suicidal ideation or panic. No fever, wt loss, night sweats, loss of appetite, or other constitutional symptoms.  Pt states good ability with ADL's, low fall risk, home safety reviewed and adequate, no significant changes in hearing or vision, and occasionally active with exercise.   Here with 5 days acute onset fever, facial pain, pressure, general weakness and malaise, and greenish d/c, with slight ST, but little to no cough and Pt denies chest pain, increased sob or doe, wheezing, orthopnea, PND, increased LE swelling, palpitations, dizziness or syncope.  Pt denies new neurological symptoms such as new headache, or facial or extremity weakness or numbness   Pt denies polydipsia, polyuria.  Now s/p TAH and left oophrectomy Past Medical History  Diagnosis Date  . DYSLIPIDEMIA 06/24/2009  . ALLERGIC RHINITIS 10/26/2007  . ASTHMA 04/10/2010  . Factor V Leiden mutation     hx of  . Ovarian cyst   . Blood dyscrasia     factor V Leiden   . PONV (postoperative nausea and vomiting)   . Family history of anesthesia complication     PONV   Past Surgical History  Procedure Date  . Tonsillectomy and adenoidectomy   . Wisdom tooth extraction   . Laparoscopic assisted vaginal hysterectomy 05/26/2012    Procedure: LAPAROSCOPIC ASSISTED VAGINAL HYSTERECTOMY;  Surgeon: Miguel Aschoff, MD;  Location: [facility];  Service: Gynecology;  Laterality:  N/A;  . Salpingoophorectomy 05/26/2012    Procedure: SALPINGO OOPHERECTOMY;  Surgeon: Miguel Aschoff, MD;  Location: [facility];  Service: Gynecology;  Laterality: Left;  . Bladder suspension 05/26/2012    Procedure: TRANSVAGINAL TAPE (TVT) PROCEDURE;  Surgeon: Loney Laurence, MD;  Location: [facility];  Service: Gynecology;  Laterality: N/A;  . Cystoscopy 05/26/2012    Procedure: CYSTOSCOPY;  Surgeon: Loney Laurence, MD;  Location: [facility];  Service: Gynecology;  Laterality: N/A;  . Cystocele repair 05/26/2012    Procedure: ANTERIOR REPAIR (CYSTOCELE);  Surgeon: Loney Laurence, MD;  Location: [facility];  Service: Gynecology;  Laterality: N/A;    reports that she has quit smoking. She does not have any smokeless tobacco history on file. She reports that she does not drink alcohol or use illicit drugs. family history includes Diabetes in her other; Heart attack (age of onset:35) in her mother; Heart disease in her mother; and Hypertension in her other. Allergies  Allergen Reactions  . Clarithromycin     REACTION: GI upset  . Penicillins     REACTION: rash   Current Outpatient Prescriptions on File Prior to Visit  Medication Sig Dispense Refill  . cetirizine (ZYRTEC) 10 MG tablet Take 10 mg by mouth daily.      Marland Kitchen guaiFENesin (MUCINEX) 600 MG 12 hr tablet Take 1,200 mg by mouth 2 (two) times daily as needed.        . levalbuterol (XOPENEX HFA) 45 MCG/ACT inhaler Inhale 2 puffs into the lungs every 6 (six) hours as needed for wheezing  or shortness of breath.  1 Inhaler  11  . mometasone-formoterol (DULERA) 100-5 MCG/ACT AERO Inhale 2 puffs into the lungs 2 (two) times daily.  1 Inhaler  11  . montelukast (SINGULAIR) 10 MG tablet Take 10 mg by mouth daily.      . Probiotic Product (PROBIOTIC PO) Take by mouth daily.      . simvastatin (ZOCOR) 40 MG tablet Take 1 tablet (40 mg total) by mouth daily.  90 tablet  2   Review of Systems  Physical Exam  VS noted Constitutional: Pt is oriented to person,  place, and time. Appears well-developed and well-nourished.  HENT:  Head: Normocephalic and atraumatic.  Right Ear: External ear normal.  Left Ear: External ear normal.  Nose: Nose normal.  Mouth/Throat: Oropharynx is clear and moist.  Eyes: Conjunctivae and EOM are normal. Pupils are equal, round, and reactive to light.  Neck: Normal range of motion. Neck supple. No JVD present. No tracheal deviation present.  Cardiovascular: Normal rate, regular rhythm, normal heart sounds and intact distal pulses.   Pulmonary/Chest: Effort normal and breath sounds normal.  Abdominal: Soft. Bowel sounds are normal. There is no tenderness.  Musculoskeletal: Normal range of motion. Exhibits no edema.  Lymphadenopathy:  Has no cervical adenopathy.  Neurological: Pt is alert and oriented to person, place, and time. Pt has normal reflexes. No cranial nerve deficit.  Skin: Skin is warm and dry. No rash noted.  Psychiatric:  Has  normal mood and affect. Behavior is normal.      Objective:   Physical Exam BP 106/70  Pulse 99  Temp 98 F (36.7 C) (Oral)  Ht 5\' 9"  (1.753 m)  Wt 205 lb 4 oz (93.101 kg)  BMI 30.31 kg/m2  SpO2 98% Physical Exam  VS noted, mild ill Constitutional: Pt appears well-developed and well-nourished.  HENT: Head: Normocephalic.  Right Ear: External ear normal.  Left Ear: External ear normal.  Bilat tm's mild erythema.  Sinus tender bilat.  Pharynx mild erythema Eyes: Conjunctivae and EOM are normal. Pupils are equal, round, and reactive to light.  Neck: Normal range of motion. Neck supple.  Cardiovascular: Normal rate and regular rhythm.   Pulmonary/Chest: Effort normal and breath sounds normal.  Neurological: Pt is alert. Not confused  Skin: Skin is warm. No erythema.  Psychiatric: Pt behavior is normal. Thought content normal.     Assessment & Plan:

## 2012-09-12 ENCOUNTER — Encounter: Payer: Self-pay | Admitting: Internal Medicine

## 2012-09-13 LAB — URINALYSIS, ROUTINE W REFLEX MICROSCOPIC
Bilirubin Urine: NEGATIVE
Total Protein, Urine: NEGATIVE
Urine Glucose: NEGATIVE
Urobilinogen, UA: 0.2 (ref 0.0–1.0)

## 2012-10-12 ENCOUNTER — Other Ambulatory Visit: Payer: Self-pay | Admitting: *Deleted

## 2012-10-12 MED ORDER — SIMVASTATIN 40 MG PO TABS: 40.0000 mg | ORAL_TABLET | Freq: Every day | ORAL | Status: DC

## 2012-10-12 NOTE — Telephone Encounter (Signed)
R'cd fax from [facility] for refill of Simvastatin 

## 2012-11-17 ENCOUNTER — Other Ambulatory Visit: Payer: Self-pay | Admitting: Internal Medicine

## 2012-12-08 ENCOUNTER — Other Ambulatory Visit: Payer: Self-pay | Admitting: Internal Medicine

## 2012-12-28 ENCOUNTER — Encounter: Payer: Self-pay | Admitting: Internal Medicine

## 2012-12-28 ENCOUNTER — Ambulatory Visit (INDEPENDENT_AMBULATORY_CARE_PROVIDER_SITE_OTHER): Payer: BC Managed Care – PPO | Admitting: Internal Medicine

## 2012-12-28 VITALS — BP 110/74 | HR 91 | Temp 98.3°F | Ht 69.0 in | Wt 210.2 lb

## 2012-12-28 DIAGNOSIS — J019 Acute sinusitis, unspecified: Secondary | ICD-10-CM

## 2012-12-28 DIAGNOSIS — J209 Acute bronchitis, unspecified: Secondary | ICD-10-CM

## 2012-12-28 DIAGNOSIS — J45909 Unspecified asthma, uncomplicated: Secondary | ICD-10-CM

## 2012-12-28 DIAGNOSIS — R062 Wheezing: Secondary | ICD-10-CM

## 2012-12-28 MED ORDER — LEVOFLOXACIN 250 MG PO TABS: 250.0000 mg | ORAL_TABLET | Freq: Every day | ORAL | Status: DC

## 2012-12-28 MED ORDER — PREDNISONE 10 MG PO TABS: ORAL_TABLET | ORAL | Status: DC

## 2012-12-28 MED ORDER — HYDROCODONE-HOMATROPINE 5-1.5 MG/5ML PO SYRP: 5.0000 mL | ORAL_SOLUTION | Freq: Four times a day (QID) | ORAL | Status: DC | PRN

## 2012-12-28 NOTE — Assessment & Plan Note (Signed)
C/w flare most likely related to acute infection, for los dose predpack which has helped in the past

## 2012-12-28 NOTE — Patient Instructions (Addendum)
Please take all new medication as prescribed - the antibiotic, cough med , and prednisone Please continue all other medications as before, and refills have been done if requested. Thank you for enrolling in MyChart. Please follow the instructions below to securely access your online medical record. MyChart allows you to send messages to your doctor, view your test results, renew your prescriptions, schedule appointments, and more. To Log into My Chart online, please go by [employer] or [employer] to [email or web address], or download the MyChart App from the [employer] .  Your Username is: [email or web address] Please send a practice Message on Mychart later today.

## 2012-12-28 NOTE — Assessment & Plan Note (Signed)
Mild to mod, for antibx course,  to f/u any worsening symptoms or concerns 

## 2012-12-28 NOTE — Progress Notes (Signed)
Subjective:    Patient ID: Robin Ayers, female    DOB: [birth year], 37 y.o.   MRN: [id number]  HPI  Here with acute onset mild to mod 2-3 days ST, HA, general weakness and malaise, with prod cough greenish sputum, but Pt denies chest pain, increased sob or doe, wheezing, orthopnea, PND, increased LE swelling, palpitations, dizziness or syncope, except for onset mild wheeze again last PM.  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 Pt denies new neurological symptoms such as new headache, or facial or extremity weakness or numbness   Pt denies polydipsia, polyuria Past Medical History  Diagnosis Date  . DYSLIPIDEMIA 06/24/2009  . ALLERGIC RHINITIS 10/26/2007  . ASTHMA 04/10/2010  . Factor V Leiden mutation     hx of  . Ovarian cyst   . Blood dyscrasia     factor V Leiden   . PONV (postoperative nausea and vomiting)   . Family history of anesthesia complication     PONV   Past Surgical History  Procedure Date  . Tonsillectomy and adenoidectomy   . Wisdom tooth extraction   . Laparoscopic assisted vaginal hysterectomy 05/26/2012    Procedure: LAPAROSCOPIC ASSISTED VAGINAL HYSTERECTOMY;  Surgeon: Miguel Aschoff, MD;  Location: [facility];  Service: Gynecology;  Laterality: N/A;  . Salpingoophorectomy 05/26/2012    Procedure: SALPINGO OOPHERECTOMY;  Surgeon: Miguel Aschoff, MD;  Location: [facility];  Service: Gynecology;  Laterality: Left;  . Bladder suspension 05/26/2012    Procedure: TRANSVAGINAL TAPE (TVT) PROCEDURE;  Surgeon: Loney Laurence, MD;  Location: [facility];  Service: Gynecology;  Laterality: N/A;  . Cystoscopy 05/26/2012    Procedure: CYSTOSCOPY;  Surgeon: Loney Laurence, MD;  Location: [facility];  Service: Gynecology;  Laterality: N/A;  . Cystocele repair 05/26/2012    Procedure: ANTERIOR REPAIR (CYSTOCELE);  Surgeon: Loney Laurence, MD;  Location: [facility];  Service: Gynecology;  Laterality: N/A;    reports that she has quit  smoking. She does not have any smokeless tobacco history on file. She reports that she does not drink alcohol or use illicit drugs. family history includes Diabetes in her other; Heart attack (age of onset:35) in her mother; Heart disease in her mother; and Hypertension in her other. Allergies  Allergen Reactions  . Clarithromycin     REACTION: GI upset  . Penicillins     REACTION: rash   Current Outpatient Prescriptions on File Prior to Visit  Medication Sig Dispense Refill  . cetirizine (ZYRTEC) 10 MG tablet Take 10 mg by mouth daily.      . DULERA 100-5 MCG/ACT AERO INHALE 2 PUFFS INTO THE LUNGS 2 (TWO) TIMES DAILY.  13 g  4  . guaiFENesin (MUCINEX) 600 MG 12 hr tablet Take 1,200 mg by mouth 2 (two) times daily as needed.        . montelukast (SINGULAIR) 10 MG tablet TAKE 1 TABLET AT BEDTIME  90 tablet  3  . simvastatin (ZOCOR) 40 MG tablet Take 1 tablet (40 mg total) by mouth daily.  90 tablet  2  . levalbuterol (XOPENEX HFA) 45 MCG/ACT inhaler Inhale 2 puffs into the lungs every 6 (six) hours as needed for wheezing or shortness of breath.  1 Inhaler  11  . montelukast (SINGULAIR) 10 MG tablet Take 10 mg by mouth daily.      . Probiotic Product (PROBIOTIC PO) Take by mouth daily.       Review of Systems  Constitutional:  Negative for unexpected weight change, or unusual diaphoresis  HENT: Negative for tinnitus.   Eyes: Negative for photophobia and visual disturbance.  Respiratory: Negative for choking and stridor.   Gastrointestinal: Negative for vomiting and blood in stool.  Genitourinary: Negative for hematuria and decreased urine volume.  Musculoskeletal: Negative for acute joint swelling Skin: Negative for color change and wound.  Neurological: Negative for tremors and numbness other than noted  Psychiatric/Behavioral: Negative for decreased concentration or  hyperactivity.       Objective:   Physical Exam BP 110/74  Pulse 91  Temp 98.3 F (36.8 C) (Oral)  Ht 5\' 9"   (1.753 m)  Wt 210 lb 4 oz (95.369 kg)  BMI 31.05 kg/m2  SpO2 98% VS noted, mildill Constitutional: Pt appears well-developed and well-nourished.  HENT: Head: NCAT.  Right Ear: External ear normal.  Left Ear: External ear normal.  Bilat tm's with mild erythema.  Max sinus areas mild tender.  Pharynx with mild erythema, no exudate Eyes: Conjunctivae and EOM are normal. Pupils are equal, round, and reactive to light.  Neck: Normal range of motion. Neck supple.  Cardiovascular: Normal rate and regular rhythm.   Pulmonary/Chest: Effort normal and breath sounds mild decreased with few wheeze bilat Neurological: Pt is alert. Not confused  Skin: Skin is warm. No erythema.  Psychiatric: Pt behavior is normal. Thought content normal.     Assessment & Plan:

## 2012-12-28 NOTE — Assessment & Plan Note (Signed)
O/w stable overall by history and exam, recent data reviewed with pt, and pt to continue medical treatment as before,  to f/u any worsening symptoms or concerns SpO2 Readings from Last 3 Encounters:  12/28/12 98%  09/09/12 98%  05/27/12 96%

## 2012-12-30 ENCOUNTER — Encounter: Payer: Self-pay | Admitting: Internal Medicine

## 2013-01-05 ENCOUNTER — Encounter: Payer: Self-pay | Admitting: Internal Medicine

## 2013-01-05 ENCOUNTER — Ambulatory Visit (INDEPENDENT_AMBULATORY_CARE_PROVIDER_SITE_OTHER)
Admission: RE | Admit: 2013-01-05 | Discharge: 2013-01-05 | Disposition: A | Discharge status: Home / Self Care | Payer: BC Managed Care – PPO | Source: Ambulatory Visit | Attending: Internal Medicine | Admitting: Internal Medicine

## 2013-01-05 ENCOUNTER — Telehealth: Payer: Self-pay | Admitting: Internal Medicine

## 2013-01-05 ENCOUNTER — Ambulatory Visit (INDEPENDENT_AMBULATORY_CARE_PROVIDER_SITE_OTHER): Payer: BC Managed Care – PPO | Admitting: Internal Medicine

## 2013-01-05 VITALS — BP 108/70 | HR 84 | Temp 98.2°F | Ht 69.0 in | Wt 210.0 lb

## 2013-01-05 DIAGNOSIS — R062 Wheezing: Secondary | ICD-10-CM

## 2013-01-05 DIAGNOSIS — J019 Acute sinusitis, unspecified: Secondary | ICD-10-CM

## 2013-01-05 DIAGNOSIS — J209 Acute bronchitis, unspecified: Secondary | ICD-10-CM

## 2013-01-05 DIAGNOSIS — R5383 Other fatigue: Secondary | ICD-10-CM

## 2013-01-05 DIAGNOSIS — R5381 Other malaise: Secondary | ICD-10-CM

## 2013-01-05 MED ORDER — PREDNISONE 10 MG PO TABS: ORAL_TABLET | ORAL | Status: DC

## 2013-01-05 MED ORDER — METHYLPREDNISOLONE ACETATE 80 MG/ML IJ SUSP
80.0000 mg | Freq: Once | INTRAMUSCULAR | Status: AC
  Administered 2013-01-05: 80 mg via INTRAMUSCULAR

## 2013-01-05 MED ORDER — HYDROCODONE-HOMATROPINE 5-1.5 MG/5ML PO SYRP: 5.0000 mL | ORAL_SOLUTION | Freq: Four times a day (QID) | ORAL | Status: DC | PRN

## 2013-01-05 MED ORDER — LEVOFLOXACIN 250 MG PO TABS: 250.0000 mg | ORAL_TABLET | Freq: Every day | ORAL | Status: DC

## 2013-01-05 NOTE — Progress Notes (Signed)
Subjective:    Patient ID: Robin Ayers, female    DOB: 1975-12-31, 37 y.o.   MRN: [id number]  HPI  Here to f/u, had been somewhat improved, then worse again with recent tx, still with feverish feeling, general weakness, malaise, fatigue, spending time in bed with fatigue, non prod cough, wheezing and mild sob/doe. Pt denies chest pain, orthopnea, PND, increased LE swelling, palpitations, dizziness or syncope.  Pt denies wt loss, night sweats, loss of appetite, or other constitutional symptoms.   Pt denies polydipsia, polyuria.  Pt denies new neurological symptoms such as new headache, or facial or extremity weakness or numbness   Past Medical History  Diagnosis Date  . DYSLIPIDEMIA 06/24/2009  . ALLERGIC RHINITIS 10/26/2007  . ASTHMA 04/10/2010  . Factor V Leiden mutation     hx of  . Ovarian cyst   . Blood dyscrasia     factor V Leiden   . PONV (postoperative nausea and vomiting)   . Family history of anesthesia complication     PONV   Past Surgical History  Procedure Date  . Tonsillectomy and adenoidectomy   . Wisdom tooth extraction   . Laparoscopic assisted vaginal hysterectomy 05/26/2012    Procedure: LAPAROSCOPIC ASSISTED VAGINAL HYSTERECTOMY;  Surgeon: Miguel Aschoff, MD;  Location: [facility];  Service: Gynecology;  Laterality: N/A;  . Salpingoophorectomy 05/26/2012    Procedure: SALPINGO OOPHERECTOMY;  Surgeon: Miguel Aschoff, MD;  Location: [facility];  Service: Gynecology;  Laterality: Left;  . Bladder suspension 05/26/2012    Procedure: TRANSVAGINAL TAPE (TVT) PROCEDURE;  Surgeon: Loney Laurence, MD;  Location: [facility];  Service: Gynecology;  Laterality: N/A;  . Cystoscopy 05/26/2012    Procedure: CYSTOSCOPY;  Surgeon: Loney Laurence, MD;  Location: [facility];  Service: Gynecology;  Laterality: N/A;  . Cystocele repair 05/26/2012    Procedure: ANTERIOR REPAIR (CYSTOCELE);  Surgeon: Loney Laurence, MD;  Location: [facility];  Service: Gynecology;  Laterality: N/A;    reports that  she has quit smoking. She does not have any smokeless tobacco history on file. She reports that she does not drink alcohol or use illicit drugs. family history includes Diabetes in her other; Heart attack (age of onset:35) in her mother; Heart disease in her mother; and Hypertension in her other. Allergies  Allergen Reactions  . Clarithromycin     REACTION: GI upset  . Penicillins     REACTION: rash   Current Outpatient Prescriptions on File Prior to Visit  Medication Sig Dispense Refill  . cetirizine (ZYRTEC) 10 MG tablet Take 10 mg by mouth daily.      . DULERA 100-5 MCG/ACT AERO INHALE 2 PUFFS INTO THE LUNGS 2 (TWO) TIMES DAILY.  13 g  4  . guaiFENesin (MUCINEX) 600 MG 12 hr tablet Take 1,200 mg by mouth 2 (two) times daily as needed.        . montelukast (SINGULAIR) 10 MG tablet TAKE 1 TABLET AT BEDTIME  90 tablet  3  . Probiotic Product (PROBIOTIC PO) Take by mouth daily.      . simvastatin (ZOCOR) 40 MG tablet Take 1 tablet (40 mg total) by mouth daily.  90 tablet  2  . levalbuterol (XOPENEX HFA) 45 MCG/ACT inhaler Inhale 2 puffs into the lungs every 6 (six) hours as needed for wheezing or shortness of breath.  1 Inhaler  11  . montelukast (SINGULAIR) 10 MG tablet Take 10 mg by mouth daily.       Review of Systems  Constitutional:  Negative for unexpected weight change, or unusual diaphoresis  HENT: Negative for tinnitus.   Eyes: Negative for photophobia and visual disturbance.  Respiratory: Negative for choking and stridor.   Gastrointestinal: Negative for vomiting and blood in stool.  Genitourinary: Negative for hematuria and decreased urine volume.  Musculoskeletal: Negative for acute joint swelling Skin: Negative for color change and wound.  Neurological: Negative for tremors and numbness other than noted  Psychiatric/Behavioral: Negative for decreased concentration or  hyperactivity.       Objective:   Physical Exam BP 108/70  Pulse 84  Temp 98.2 F (36.8 C) (Oral)   Ht 5\' 9"  (1.753 m)  Wt 210 lb (95.255 kg)  BMI 31.01 kg/m2  SpO2 98% VS noted, mild ill Constitutional: Pt appears well-developed and well-nourished.  HENT: Head: NCAT.  Right Ear: External ear normal.  Left Ear: External ear normal.  Bilat tm's with mild erythema.  Max sinus areas mild tender.  Pharynx with mild erythema, no exudate Eyes: Conjunctivae and EOM are normal. Pupils are equal, round, and reactive to light.  Neck: Normal range of motion. Neck supple.  Cardiovascular: Normal rate and regular rhythm.   Pulmonary/Chest: Effort normal and breath sounds decreased with bilat wheeze Neurological: Pt is alert. Not confused  Skin: Skin is warm. No erythema.  Psychiatric: Pt behavior is normal. Thought content normal.     Assessment & Plan:

## 2013-01-05 NOTE — Telephone Encounter (Signed)
Patient Information:  Caller Name: Sharna  Phone: [phone]  Patient: Robin Ayers, [place]  Gender: Female  DOB: 04/04/1976  Age: 37 Years  PCP: Oliver Barre (Adults only)  Pregnant: No  Office Follow Up:  Does the office need to follow up with this patient?: No  Instructions For The Office: N/A   Symptoms  Reason For Call & Symptoms: Patient in office on 12/28/12 with bronchitis and wheezing.  She was placed on Levaquin, Hycodan and Prednisone.  Her inhaler every 6 hours and prescribed Dulera  but there has been no changes.  She is not worse but IS NOT BETTER.  Worsening short of breath with excertion.  Reviewed Health History In EMR: Yes  Reviewed Medications In EMR: Yes  Reviewed Allergies In EMR: Yes  Reviewed Surgeries / Procedures: No  Date of Onset of Symptoms: 12/21/2012  Treatments Tried: Levaquin, Dulers, albuterol , Hycodan , zytrec and singulair  Treatments Tried Worked: No OB / GYN:  LMP: Unknown  Guideline(s) Used:  Asthma Attack  Disposition Per Guideline:   See Today in Office  Reason For Disposition Reached:   Coughing continuously (nonstop) that keeps from working or sleeping, and not improved after inhaler or nebulizer  Advice Given:  Quick-Relief Asthma Medicine:   Start your quick-relief medicine (e.g., albuterol, salbutamol) at the first sign of any coughing or shortness of breath (don't wait for wheezing). Use your inhaler (2 puffs each time) or nebulizer every 4 hours. Continue the quick-relief medicine until you have not wheezed or coughed for 48 hours.  The best "cough medicine" for an adult with asthma is always the asthma medicine (Note: Don't use cough suppressants, but cough drops may help a tickly cough).  Drinking Liquids:  Try to drink normal amount of liquids (e.g., water). Being adequately hydrated makes it easier to cough up the sticky lung mucus.  Humidifier:   If the air is dry, use a cool mist humidifier to prevent drying of the  upper airway.  Avoid Triggers:  Avoid known triggers of asthma attacks (e.g., tobacco smoke, cats, other pets, feather pillows, exercise).  Call Back If:  Inhaled asthma medicine (nebulizer or inhaler) is needed more often than every 4 hours  Wheezing has not completely cleared after 5 days  You become worse.  Appointment Scheduled:  01/05/2013 16:15:00 Appointment Scheduled Provider:  Oliver Barre (Adults only)

## 2013-01-05 NOTE — Patient Instructions (Addendum)
You had the steroid shot today Please take all new medication as prescribed - the antibiotic, cough medicine if needed, and repeat prednisone (at a higher dose) Please continue all other medications as before, and refills have been done if requested. Please have the pharmacy call with any other refills you may need. Please go to the XRAY Department in the Basement (go straight as you get off the elevator) for the x-ray testing You will be contacted by phone if any changes need to be made immediately.  Otherwise, you will receive a letter about your results with an explanation, but please check with MyChart first. Thank you for enrolling in MyChart. Please follow the instructions below to securely access your online medical record. MyChart allows you to send messages to your doctor, view your test results, renew your prescriptions, schedule appointments, and more.

## 2013-01-05 NOTE — Assessment & Plan Note (Signed)
Mild to mod persistent, for depomedrol IM, predpack asd,  to f/u any worsening symptoms or concerns

## 2013-01-05 NOTE — Assessment & Plan Note (Addendum)
Only mild improved to date, for cxr r/o other such as sarcoid, cont antibx

## 2013-01-05 NOTE — Assessment & Plan Note (Signed)
Signfiicant, most likely related to acute illness, declines further eval for now but consider f/u labs such as cbc if persists

## 2013-01-27 ENCOUNTER — Ambulatory Visit (INDEPENDENT_AMBULATORY_CARE_PROVIDER_SITE_OTHER): Payer: BC Managed Care – PPO | Admitting: Internal Medicine

## 2013-01-27 ENCOUNTER — Encounter: Payer: Self-pay | Admitting: Internal Medicine

## 2013-01-27 VITALS — BP 102/70 | HR 90 | Temp 97.0°F | Ht 69.0 in | Wt 208.0 lb

## 2013-01-27 DIAGNOSIS — J45909 Unspecified asthma, uncomplicated: Secondary | ICD-10-CM

## 2013-01-27 DIAGNOSIS — J309 Allergic rhinitis, unspecified: Secondary | ICD-10-CM

## 2013-01-27 DIAGNOSIS — J019 Acute sinusitis, unspecified: Secondary | ICD-10-CM

## 2013-01-27 MED ORDER — LEVOFLOXACIN 500 MG PO TABS: 500.0000 mg | ORAL_TABLET | Freq: Every day | ORAL | Status: DC

## 2013-01-27 NOTE — Progress Notes (Signed)
Subjective:    Patient ID: Robin Ayers, female    DOB: 11-22-1976, 37 y.o.   MRN: [id number]  HPI  Here to f/u, symptoms last visit resolved, then with 2-3 days acute onset fever, facial pain, pressure, headache, general weakness and malaise, and greenish d/c, with mild ST and cough, but pt denies chest pain, wheezing, increased sob or doe, orthopnea, PND, increased LE swelling, palpitations, dizziness or syncope.   Pt denies polydipsia, polyuria.  Pt denies new neurological symptoms such as new headache, or facial or extremity weakness or numbness.  No recent worsening nasal allergy symptoms with clearish congestion, itch and sneezing Past Medical History  Diagnosis Date  . DYSLIPIDEMIA 06/24/2009  . ALLERGIC RHINITIS 10/26/2007  . ASTHMA 04/10/2010  . Factor V Leiden mutation     hx of  . Ovarian cyst   . Blood dyscrasia     factor V Leiden   . PONV (postoperative nausea and vomiting)   . Family history of anesthesia complication     PONV   Past Surgical History  Procedure Laterality Date  . Tonsillectomy and adenoidectomy    . Wisdom tooth extraction    . Laparoscopic assisted vaginal hysterectomy  05/26/2012    Procedure: LAPAROSCOPIC ASSISTED VAGINAL HYSTERECTOMY;  Surgeon: Miguel Aschoff, MD;  Location: [facility];  Service: Gynecology;  Laterality: N/A;  . Salpingoophorectomy  05/26/2012    Procedure: SALPINGO OOPHERECTOMY;  Surgeon: Miguel Aschoff, MD;  Location: [facility];  Service: Gynecology;  Laterality: Left;  . Bladder suspension  05/26/2012    Procedure: TRANSVAGINAL TAPE (TVT) PROCEDURE;  Surgeon: Loney Laurence, MD;  Location: [facility];  Service: Gynecology;  Laterality: N/A;  . Cystoscopy  05/26/2012    Procedure: CYSTOSCOPY;  Surgeon: Loney Laurence, MD;  Location: [facility];  Service: Gynecology;  Laterality: N/A;  . Cystocele repair  05/26/2012    Procedure: ANTERIOR REPAIR (CYSTOCELE);  Surgeon: Loney Laurence, MD;  Location: [facility];  Service: Gynecology;  Laterality:  N/A;    reports that she has quit smoking. She does not have any smokeless tobacco history on file. She reports that she does not drink alcohol or use illicit drugs. family history includes Diabetes in her other; Heart attack (age of onset: 36) in her mother; Heart disease in her mother; and Hypertension in her other. Allergies  Allergen Reactions  . Clarithromycin     REACTION: GI upset  . Penicillins     REACTION: rash   Current Outpatient Prescriptions on File Prior to Visit  Medication Sig Dispense Refill  . cetirizine (ZYRTEC) 10 MG tablet Take 10 mg by mouth daily.      . DULERA 100-5 MCG/ACT AERO INHALE 2 PUFFS INTO THE LUNGS 2 (TWO) TIMES DAILY.  13 g  4  . guaiFENesin (MUCINEX) 600 MG 12 hr tablet Take 1,200 mg by mouth 2 (two) times daily as needed.        Marland Kitchen HYDROcodone-homatropine (HYCODAN) 5-1.5 MG/5ML syrup Take 5 mLs by mouth every 6 (six) hours as needed for cough.  120 mL  1  . montelukast (SINGULAIR) 10 MG tablet TAKE 1 TABLET AT BEDTIME  90 tablet  3  . predniSONE (DELTASONE) 10 MG tablet 2 tab by mouth per day for 7 days  14 tablet  0  . Probiotic Product (PROBIOTIC PO) Take by mouth daily.      . simvastatin (ZOCOR) 40 MG tablet Take 1 tablet (40 mg total) by mouth daily.  90 tablet  2  .  levalbuterol (XOPENEX HFA) 45 MCG/ACT inhaler Inhale 2 puffs into the lungs every 6 (six) hours as needed for wheezing or shortness of breath.  1 Inhaler  11  . montelukast (SINGULAIR) 10 MG tablet Take 10 mg by mouth daily.       No current facility-administered medications on file prior to visit.   Review of Systems  Constitutional: Negative for unexpected weight change, or unusual diaphoresis  HENT: Negative for tinnitus.   Eyes: Negative for photophobia and visual disturbance.  Respiratory: Negative for choking and stridor.   Gastrointestinal: Negative for vomiting and blood in stool.  Genitourinary: Negative for hematuria and decreased urine volume.  Musculoskeletal:  Negative for acute joint swelling Skin: Negative for color change and wound.  Neurological: Negative for tremors and numbness other than noted  Psychiatric/Behavioral: Negative for decreased concentration or  hyperactivity.       Objective:   Physical Exam BP 102/70  Pulse 90  Temp(Src) 97 F (36.1 C) (Oral)  Ht 5\' 9"  (1.753 m)  Wt 208 lb (94.348 kg)  BMI 30.7 kg/m2  SpO2 97%  LMP 04/16/2012 VS noted, mild ill Constitutional: Pt appears well-developed and well-nourished.  HENT: Head: NCAT.  Right Ear: External ear normal.  Left Ear: External ear normal.  Bilat tm's with mild erythema.  Max sinus areas mod tender bilat.  Pharynx with mild erythema, no exudate Eyes: Conjunctivae and EOM are normal. Pupils are equal, round, and reactive to light.  Neck: Normal range of motion. Neck supple.  Cardiovascular: Normal rate and regular rhythm.   Pulmonary/Chest: Effort normal and breath sounds normal.  Neurological: Pt is alert. Not confused  Skin: Skin is warm. No erythema.  Psychiatric: Pt behavior is normal. Thought content normal.     Assessment & Plan:

## 2013-01-27 NOTE — Patient Instructions (Signed)
Please take all new medication as prescribed Please continue all other medications as before, and refills have been done if requested.  

## 2013-01-28 NOTE — Assessment & Plan Note (Signed)
stable overall by history and exam, recent data reviewed with pt, and pt to continue medical treatment as before,  to f/u any worsening symptoms or concerns SpO2 Readings from Last 3 Encounters:  01/27/13 97%  01/05/13 98%  12/28/12 98%

## 2013-01-28 NOTE — Assessment & Plan Note (Signed)
stable overall by history and exam, and pt to continue medical treatment as before,  to f/u any worsening symptoms or concerns 

## 2013-01-28 NOTE — Assessment & Plan Note (Signed)
Mild to mod, for antibx course,  to f/u any worsening symptoms or concerns 

## 2013-02-03 ENCOUNTER — Encounter: Payer: Self-pay | Admitting: Internal Medicine

## 2013-02-05 ENCOUNTER — Other Ambulatory Visit: Payer: Self-pay | Admitting: Internal Medicine

## 2013-02-05 DIAGNOSIS — H9203 Otalgia, bilateral: Secondary | ICD-10-CM

## 2013-02-12 ENCOUNTER — Telehealth: Payer: Self-pay | Admitting: Internal Medicine

## 2013-02-12 NOTE — Telephone Encounter (Signed)
Per pt email:  Waiting for work of ENT referral appt  Unfortunately I dont have anything else to offer at this time.  I will remind the PCC's that you are still waiting on the ENT. Thanks ===View-only below this line===

## 2013-06-01 ENCOUNTER — Other Ambulatory Visit: Payer: Self-pay | Admitting: Internal Medicine

## 2013-06-23 ENCOUNTER — Ambulatory Visit (INDEPENDENT_AMBULATORY_CARE_PROVIDER_SITE_OTHER): Payer: BC Managed Care – PPO | Admitting: Internal Medicine

## 2013-06-23 ENCOUNTER — Encounter: Payer: Self-pay | Admitting: Internal Medicine

## 2013-06-23 VITALS — BP 110/80 | HR 81 | Temp 99.4°F | Ht 69.0 in | Wt 209.5 lb

## 2013-06-23 DIAGNOSIS — J45909 Unspecified asthma, uncomplicated: Secondary | ICD-10-CM

## 2013-06-23 DIAGNOSIS — J309 Allergic rhinitis, unspecified: Secondary | ICD-10-CM

## 2013-06-23 DIAGNOSIS — R062 Wheezing: Secondary | ICD-10-CM

## 2013-06-23 DIAGNOSIS — J209 Acute bronchitis, unspecified: Secondary | ICD-10-CM

## 2013-06-23 MED ORDER — LEVALBUTEROL TARTRATE 45 MCG/ACT IN AERO: 2.0000 | INHALATION_SPRAY | Freq: Four times a day (QID) | RESPIRATORY_TRACT | Status: DC | PRN

## 2013-06-23 MED ORDER — AZITHROMYCIN 250 MG PO TABS: ORAL_TABLET | ORAL | Status: DC

## 2013-06-23 MED ORDER — HYDROCODONE-HOMATROPINE 5-1.5 MG/5ML PO SYRP: 5.0000 mL | ORAL_SOLUTION | Freq: Four times a day (QID) | ORAL | Status: DC | PRN

## 2013-06-23 MED ORDER — ATORVASTATIN CALCIUM 40 MG PO TABS: 40.0000 mg | ORAL_TABLET | Freq: Every day | ORAL | Status: DC

## 2013-06-23 MED ORDER — METHYLPREDNISOLONE ACETATE 80 MG/ML IJ SUSP
80.0000 mg | Freq: Once | INTRAMUSCULAR | Status: AC
  Administered 2013-06-23: 80 mg via INTRAMUSCULAR

## 2013-06-23 MED ORDER — PREDNISONE 10 MG PO TABS: ORAL_TABLET | ORAL | Status: DC

## 2013-06-23 NOTE — Assessment & Plan Note (Signed)
stable overall by history and exam, and pt to continue medical treatment as before,  to f/u any worsening symptoms or concerns 

## 2013-06-23 NOTE — Progress Notes (Signed)
Subjective:    Patient ID: Kianna Billet, female    DOB: 07/01/1976, 37 y.o.   MRN: [id number]  HPI  Here with acute onset mild to mod 2-3 days ST, HA, general weakness and malaise, with prod cough greenish sputum, but Pt denies chest pain, increased sob or doe, wheezing, orthopnea, PND, increased LE swelling, palpitations, dizziness or syncope, except for onset wheezing and mild sob last night, better with inhaler.  Has ill 37yo at home as well with similar. Prior to onset current symtpoms, asthma and allergies well controlled without discomfort, congestion, wheezing or nighttime awakenings Past Medical History  Diagnosis Date  . DYSLIPIDEMIA 06/24/2009  . ALLERGIC RHINITIS 10/26/2007  . ASTHMA 04/10/2010  . Factor V Leiden mutation     hx of  . Ovarian cyst   . Blood dyscrasia     factor V Leiden   . PONV (postoperative nausea and vomiting)   . Family history of anesthesia complication     PONV   Past Surgical History  Procedure Laterality Date  . Tonsillectomy and adenoidectomy    . Wisdom tooth extraction    . Laparoscopic assisted vaginal hysterectomy  05/26/2012    Procedure: LAPAROSCOPIC ASSISTED VAGINAL HYSTERECTOMY;  Surgeon: Miguel Aschoff, MD;  Location: [facility];  Service: Gynecology;  Laterality: N/A;  . Salpingoophorectomy  05/26/2012    Procedure: SALPINGO OOPHERECTOMY;  Surgeon: Miguel Aschoff, MD;  Location: [facility];  Service: Gynecology;  Laterality: Left;  . Bladder suspension  05/26/2012    Procedure: TRANSVAGINAL TAPE (TVT) PROCEDURE;  Surgeon: Loney Laurence, MD;  Location: [facility];  Service: Gynecology;  Laterality: N/A;  . Cystoscopy  05/26/2012    Procedure: CYSTOSCOPY;  Surgeon: Loney Laurence, MD;  Location: [facility];  Service: Gynecology;  Laterality: N/A;  . Cystocele repair  05/26/2012    Procedure: ANTERIOR REPAIR (CYSTOCELE);  Surgeon: Loney Laurence, MD;  Location: [facility];  Service: Gynecology;  Laterality: N/A;    reports that she has quit smoking.  She does not have any smokeless tobacco history on file. She reports that she does not drink alcohol or use illicit drugs. family history includes Diabetes in her other; Heart attack (age of onset: 72) in her mother; Heart disease in her mother; and Hypertension in her other. Allergies  Allergen Reactions  . Clarithromycin     REACTION: GI upset  . Penicillins     REACTION: rash   Current Outpatient Prescriptions on File Prior to Visit  Medication Sig Dispense Refill  . DULERA 100-5 MCG/ACT AERO INHALE 2 PUFFS INTO THE LUNGS 2 (TWO) TIMES DAILY.  13 g  6  . guaiFENesin (MUCINEX) 600 MG 12 hr tablet Take 1,200 mg by mouth 2 (two) times daily as needed.       . montelukast (SINGULAIR) 10 MG tablet TAKE 1 TABLET AT BEDTIME  90 tablet  3  . Probiotic Product (PROBIOTIC PO) Take by mouth daily.      . montelukast (SINGULAIR) 10 MG tablet Take 10 mg by mouth daily.       No current facility-administered medications on file prior to visit.   Review of Systems  Constitutional: Negative for unexpected weight change, or unusual diaphoresis  HENT: Negative for tinnitus.   Eyes: Negative for photophobia and visual disturbance.  Respiratory: Negative for choking and stridor.   Gastrointestinal: Negative for vomiting and blood in stool.  Genitourinary: Negative for hematuria and decreased urine volume.  Musculoskeletal: Negative for acute joint swelling Skin: Negative for  color change and wound.  Neurological: Negative for tremors and numbness other than noted  Psychiatric/Behavioral: Negative for decreased concentration or  hyperactivity.       Objective:   Physical Exam BP 110/80  Pulse 81  Temp(Src) 99.4 F (37.4 C) (Oral)  Ht 5\' 9"  (1.753 m)  Wt 209 lb 8 oz (95.029 kg)  BMI 30.92 kg/m2  SpO2 97%  LMP 04/16/2012 VS noted, mild ill Constitutional: Pt appears well-developed and well-nourished.  HENT: Head: NCAT.  Right Ear: External ear normal.  Left Ear: External ear normal.   Bilat tm's with mild erythema.  Max sinus areas non tender.  Pharynx with mild erythema, no exudate Eyes: Conjunctivae and EOM are normal. Pupils are equal, round, and reactive to light.  Neck: Normal range of motion. Neck supple.  Cardiovascular: Normal rate and regular rhythm.   Pulmonary/Chest: Effort normal and breath sounds decreased bilat with bilat wheezes.  Neurological: Pt is alert. Not confused  Skin: Skin is warm. No erythema.  Psychiatric: Pt behavior is normal. Thought content normal.     Assessment & Plan:

## 2013-06-23 NOTE — Patient Instructions (Signed)
You had the steroid shot today Please take all new medication as prescribed - the antibiotic, cough medicine, prednisone Please continue all other medications as before, and refills have been done if requested - the inhaler  Please remember to sign up for My Chart if you have not done so, as this will be important to you in the future with finding out test results, communicating by private email, and scheduling acute appointments online when needed.

## 2013-06-23 NOTE — Assessment & Plan Note (Signed)
Mild to mod, for antibx course,  to f/u any worsening symptoms or concerns 

## 2013-06-23 NOTE — Assessment & Plan Note (Signed)
Mild to mod, for depomedrol IM, and predpack asd, cough med,  to f/u any worsening symptoms or concerns

## 2013-06-23 NOTE — Assessment & Plan Note (Signed)
O/w stable overall by history and exam, recent data reviewed with pt, and pt to continue medical treatment as before,  to f/u any worsening symptoms or concerns SpO2 Readings from Last 3 Encounters:  06/23/13 97%  01/27/13 97%  01/05/13 98%

## 2013-07-06 ENCOUNTER — Other Ambulatory Visit: Payer: Self-pay | Admitting: Internal Medicine

## 2013-08-02 ENCOUNTER — Encounter: Payer: Self-pay | Admitting: Obstetrics and Gynecology

## 2013-08-02 ENCOUNTER — Ambulatory Visit (INDEPENDENT_AMBULATORY_CARE_PROVIDER_SITE_OTHER): Payer: BC Managed Care – PPO | Admitting: Obstetrics and Gynecology

## 2013-08-02 VITALS — BP 122/74 | HR 76 | Ht 68.25 in | Wt 210.0 lb

## 2013-08-02 DIAGNOSIS — Z01419 Encounter for gynecological examination (general) (routine) without abnormal findings: Secondary | ICD-10-CM

## 2013-08-02 DIAGNOSIS — Z0000 Encounter for general adult medical examination without abnormal findings: Secondary | ICD-10-CM

## 2013-08-02 DIAGNOSIS — N644 Mastodynia: Secondary | ICD-10-CM

## 2013-08-02 LAB — POCT URINALYSIS DIPSTICK
Bilirubin, UA: NEGATIVE
Ketones, UA: NEGATIVE
Leukocytes, UA: NEGATIVE

## 2013-08-02 NOTE — Progress Notes (Signed)
Patient ID: Unnamed Robin Ayers, female   DOB: 1976/08/06, 37 y.o.   MRN: [id number] GYNECOLOGY VISIT  PCP:  Dr. Oliver Barre  Referring provider:   HPI: 37 y.o.   Married  Caucasian  female   [phone] with Patient's last menstrual period was 04/16/2012.   here for  AEX Status post LAVH/LSO/Anterior and colporrhaphy/TVT/cysto - no pain, no bleeding, no leaking urine.    Having left breast pain constantly but increasing "around the time of menses"  Does not have cycles due to her hysterectomy. Worried about the persistence of the pain.   Trying to loose weight.  Labs today:    Hgb:  PCP Urine:  Trace RBCs.  (asymptomatic).  GYNECOLOGIC HISTORY: Patient's last menstrual period was 04/16/2012. Menses:  LAVH 05/2012 Sexually active:  yes Partner preference: female Contraception:  status post hysterectomy Hormone therapy:  DES exposure:  denies Blood transfusions:  none Sexually transmitted diseases:  The patient denies history of sexually transmitted disease. Previous GYN Procedures:  laparoscopic assisted vaginal hysterectomy 05/2012--Dr. Miguel Aschoff. Last mammogram:  never Date:                  Location:     Last pap: Date: 05/2012--wnl History of abnormal pap smear:  Yes, pt. States had colposcopy in her early 20's with no treatment to her cervix.  Pap smears reverted to normal.   OB History   Grav Para Term Preterm Abortions TAB SAB Ect Mult Living   4 2 2  1  1   2        LIFESTYLE: Exercise:   no            Tobacco: no Alcohol:no Drug use:  no  OTHER HEALTH MAINTENANCE: Last tetanus/TDap:  2009 Gardisil: no Last Influenza:  08/2012 Zostavax: never  Last bone density: never Last colonoscopy: never  Last cholesterol check: 2013wnl with PCP (patient takes Lipitor)  Family History  Problem Relation Age of Onset  . Heart disease Mother   . Heart attack Mother 37  . Diabetes Mother   . Hypertension Mother   . Diabetes Other   . Hypertension Other   .  Hypertension Maternal Grandfather   . Heart disease Maternal Grandfather     Patient Active Problem List   Diagnosis Date Noted  . Acute bronchitis 06/23/2013  . Wheezing 06/23/2013  . Factor V Leiden mutation   . Preventative health care 07/06/2011  . ASTHMA 04/10/2010  . Tenosynovitis of foot and ankle 10/11/2009  . DYSLIPIDEMIA 06/24/2009  . HAND PAIN, BILATERAL 06/24/2009  . FATIGUE 06/24/2009  . URINARY INCONTINENCE 06/24/2009  . ALLERGIC RHINITIS 10/26/2007    Past Medical History  Diagnosis Date  . DYSLIPIDEMIA 06/24/2009  . ALLERGIC RHINITIS 10/26/2007  . ASTHMA 04/10/2010  . Factor V Leiden mutation     hx of  . Ovarian cyst   . Blood dyscrasia     factor V Leiden   . PONV (postoperative nausea and vomiting)   . Family history of anesthesia complication     PONV  . Clotting disorder     Past Surgical History  Procedure Laterality Date  . Tonsillectomy and adenoidectomy    . Wisdom tooth extraction    . Laparoscopic assisted vaginal hysterectomy  05/26/2012    Procedure: LAPAROSCOPIC ASSISTED VAGINAL HYSTERECTOMY;  Surgeon: Miguel Aschoff, MD;  Location: [facility];  Service: Gynecology;  Laterality: N/A;  . Bladder suspension  05/26/2012    Procedure: TRANSVAGINAL TAPE (TVT) PROCEDURE;  Surgeon: Marcelino Duster  Ruthine Dose, MD;  Location: [facility];  Service: Gynecology;  Laterality: N/A;  . Cystoscopy  05/26/2012    Procedure: CYSTOSCOPY;  Surgeon: Loney Laurence, MD;  Location: [facility];  Service: Gynecology;  Laterality: N/A;  . Cystocele repair  05/26/2012    Procedure: ANTERIOR REPAIR (CYSTOCELE);  Surgeon: Loney Laurence, MD;  Location: [facility];  Service: Gynecology;  Laterality: N/A;  . Salpingoophorectomy  05/26/2012    Procedure: SALPINGO OOPHERECTOMY;  Surgeon: Miguel Aschoff, MD;  Location: [facility];  Service: Gynecology;  Laterality: Left;    ALLERGIES: Clarithromycin and Penicillins  Current Outpatient Prescriptions  Medication Sig Dispense Refill  . atorvastatin  (LIPITOR) 40 MG tablet Take 1 tablet (40 mg total) by mouth daily.  90 tablet  3  . azithromycin (ZITHROMAX Z-PAK) 250 MG tablet Use as directed  6 each  1  . DULERA 100-5 MCG/ACT AERO INHALE 2 PUFFS INTO THE LUNGS 2 (TWO) TIMES DAILY.  13 g  6  . guaiFENesin (MUCINEX) 600 MG 12 hr tablet Take 1,200 mg by mouth 2 (two) times daily as needed.       . levalbuterol (XOPENEX HFA) 45 MCG/ACT inhaler Inhale 2 puffs into the lungs every 6 (six) hours as needed for wheezing or shortness of breath.  1 Inhaler  11  . montelukast (SINGULAIR) 10 MG tablet TAKE 1 TABLET AT BEDTIME  90 tablet  3  . Probiotic Product (PROBIOTIC PO) Take by mouth daily.      . DULERA 100-5 MCG/ACT AERO INHALE 2 PUFFS INTO THE LUNGS 2 (TWO) TIMES DAILY.  13 g  4  . HYDROcodone-homatropine (HYCODAN) 5-1.5 MG/5ML syrup Take 5 mLs by mouth every 6 (six) hours as needed for cough.  120 mL  1  . montelukast (SINGULAIR) 10 MG tablet Take 10 mg by mouth daily.      . predniSONE (DELTASONE) 10 MG tablet 3 tabs by mouth per day for 3 days,2tabs per day for 3 days,1tab per day for 3 days  18 tablet  0   No current facility-administered medications for this visit.     ROS:  Pertinent items are noted in HPI.  SOCIAL HISTORY:  teacher  PHYSICAL EXAMINATION:    BP 122/74  Pulse 76  Ht 5' 8.25" (1.734 m)  Wt 210 lb (95.255 kg)  BMI 31.68 kg/m2  LMP 04/16/2012   Wt Readings from Last 3 Encounters:  08/02/13 210 lb (95.255 kg)  06/23/13 209 lb 8 oz (95.029 kg)  01/27/13 208 lb (94.348 kg)     Ht Readings from Last 3 Encounters:  08/02/13 5' 8.25" (1.734 m)  06/23/13 5\' 9"  (1.753 m)  01/27/13 5\' 9"  (1.753 m)    General appearance: alert, cooperative and appears stated age Head: Normocephalic, without obvious abnormality, atraumatic Neck: no adenopathy, supple, symmetrical, trachea midline and thyroid not enlarged, symmetric, no tenderness/mass/nodules Lungs: clear to auscultation bilaterally Breasts: Inspection negative, No  nipple retraction or dimpling, No nipple discharge or bleeding, No axillary or supraclavicular adenopathy, Normal to palpation without dominant masses Heart: regular rate and rhythm Abdomen: soft, non-tender;  no masses,  no organomegaly Extremities: extremities normal, atraumatic, no cyanosis or edema Skin: Skin color, texture, turgor normal. No rashes or lesions Lymph nodes: Cervical, supraclavicular, and axillary nodes normal. No abnormal inguinal nodes palpated Neurologic: Grossly normal   Pelvic: External genitalia:  no lesions              Urethra:  normal appearing urethra with no masses,  tenderness or lesions              Bartholins and Skenes: normal                 Vagina: normal appearing vagina with normal color and discharge, no lesions              Cervix:  absent         Bimanual Exam:  Uterus:   absent                                      Adnexa:  nontender and no masses                                  ASSESSMENT  Normal gynecologic exam. Left mastalgia. Microscopic hematuria.  PLAN  Diagnostic bilateral mammogram and left breast ultrasound. No pap needed. No urine culture sent. Recheck with PCP this fall at routine visit. Return for symptoms of pain or discomfort with urination.  Will do cholesterol check with her PCP.  Return annually or prn   An After Visit Summary was printed and given to the patient.

## 2013-08-02 NOTE — Patient Instructions (Signed)

## 2013-08-22 ENCOUNTER — Ambulatory Visit
Admission: RE | Admit: 2013-08-22 | Discharge: 2013-08-22 | Disposition: A | Discharge status: Home / Self Care | Payer: BC Managed Care – PPO | Source: Ambulatory Visit | Attending: Obstetrics and Gynecology | Admitting: Obstetrics and Gynecology

## 2013-08-22 DIAGNOSIS — N644 Mastodynia: Secondary | ICD-10-CM

## 2013-08-28 NOTE — Progress Notes (Signed)
Out of Mammo Hold 08/22/13 cm

## 2013-10-11 ENCOUNTER — Ambulatory Visit (INDEPENDENT_AMBULATORY_CARE_PROVIDER_SITE_OTHER): Payer: BC Managed Care – PPO | Admitting: Internal Medicine

## 2013-10-11 ENCOUNTER — Encounter: Payer: Self-pay | Admitting: Internal Medicine

## 2013-10-11 ENCOUNTER — Other Ambulatory Visit (INDEPENDENT_AMBULATORY_CARE_PROVIDER_SITE_OTHER): Payer: BC Managed Care – PPO

## 2013-10-11 VITALS — BP 102/80 | HR 86 | Temp 97.5°F | Ht 69.0 in | Wt 213.2 lb

## 2013-10-11 DIAGNOSIS — Z0000 Encounter for general adult medical examination without abnormal findings: Secondary | ICD-10-CM

## 2013-10-11 DIAGNOSIS — J019 Acute sinusitis, unspecified: Secondary | ICD-10-CM

## 2013-10-11 DIAGNOSIS — J329 Chronic sinusitis, unspecified: Secondary | ICD-10-CM | POA: Insufficient documentation

## 2013-10-11 LAB — CBC WITH DIFFERENTIAL/PLATELET
Basophils Relative: 0.3 % (ref 0.0–3.0)
Eosinophils Relative: 2 % (ref 0.0–5.0)
HCT: 39.8 % (ref 36.0–46.0)
Hemoglobin: 13.5 g/dL (ref 12.0–15.0)
Lymphocytes Relative: 21.5 % (ref 12.0–46.0)
Lymphs Abs: 1.7 10*3/uL (ref 0.7–4.0)
Monocytes Relative: 6.5 % (ref 3.0–12.0)
Platelets: 275 10*3/uL (ref 150.0–400.0)
RBC: 4.5 Mil/uL (ref 3.87–5.11)
WBC: 7.8 10*3/uL (ref 4.5–10.5)

## 2013-10-11 LAB — HEPATIC FUNCTION PANEL
ALT: 19 U/L (ref 0–35)
AST: 17 U/L (ref 0–37)
Albumin: 4 g/dL (ref 3.5–5.2)
Alkaline Phosphatase: 99 U/L (ref 39–117)
Bilirubin, Direct: 0.1 mg/dL (ref 0.0–0.3)
Total Bilirubin: 0.4 mg/dL (ref 0.3–1.2)

## 2013-10-11 LAB — BASIC METABOLIC PANEL
BUN: 16 mg/dL (ref 6–23)
Calcium: 9.4 mg/dL (ref 8.4–10.5)
Chloride: 103 mEq/L (ref 96–112)
GFR: 84.51 mL/min (ref >60.00)
Potassium: 4.1 mEq/L (ref 3.5–5.1)
Sodium: 136 mEq/L (ref 135–145)

## 2013-10-11 LAB — URINALYSIS, ROUTINE W REFLEX MICROSCOPIC
Ketones, ur: NEGATIVE
Leukocytes, UA: NEGATIVE
Nitrite: NEGATIVE
Specific Gravity, Urine: 1.01 (ref 1.000–1.030)
Total Protein, Urine: NEGATIVE
pH: 6 (ref 5.0–8.0)

## 2013-10-11 LAB — LIPID PANEL
LDL Cholesterol: 104 mg/dL — ABNORMAL HIGH (ref 0–99)
Total CHOL/HDL Ratio: 4
Triglycerides: 146 mg/dL (ref 0.0–149.0)

## 2013-10-11 LAB — TSH: TSH: 1.45 u[IU]/mL (ref 0.35–5.50)

## 2013-10-11 MED ORDER — LEVOFLOXACIN 250 MG PO TABS: 250.0000 mg | ORAL_TABLET | Freq: Every day | ORAL | Status: DC

## 2013-10-11 NOTE — Patient Instructions (Signed)
Please take all new medication as prescribed - the antibiotic You can also take Delsym OTC for cough, and/or Mucinex (or it's generic off brand) for congestion, and tylenol as needed for pain. Please continue all other medications as before Please have the pharmacy call with any other refills you may need. Please continue your efforts at being more active, low cholesterol diet, and weight control. You are otherwise up to date with prevention measures today. Please keep your appointments with your specialists as you may have planned   Please go to the LAB in the Basement (turn left off the elevator) for the tests to be done today You will be contacted by phone if any changes need to be made immediately.  Otherwise, you will receive a letter about your results with an explanation, but please check with MyChart first.  Please remember to sign up for My Chart if you have not done so, as this will be important to you in the future with finding out test results, communicating by private email, and scheduling acute appointments online when needed.  Please return in 1 year for your yearly visit, or sooner if needed, with Lab testing done 3-5 days before

## 2013-10-11 NOTE — Progress Notes (Signed)
Subjective:    Patient ID: Robin Ayers, female    DOB: 19-Dec-1975, 37 y.o.   MRN: [id number]  HPI  Here for wellness and f/u;  Overall doing ok;  Pt denies CP, worsening SOB, DOE, wheezing, orthopnea, PND, worsening LE edema, palpitations, dizziness or syncope.  Pt denies neurological change such as new headache, facial or extremity weakness.  Pt denies polydipsia, polyuria, or low sugar symptoms. Pt states overall good compliance with treatment and medications, good tolerability, and has been trying to follow lower cholesterol diet.  Pt denies worsening depressive symptoms, suicidal ideation or panic. No fever, night sweats, wt loss, loss of appetite, or other constitutional symptoms.  Pt states good ability with ADL's, has low fall risk, home safety reviewed and adequate, no other significant changes in hearing or vision, and only occasionally active with exercise.  Incidnetly  Here with 2-3 days acute onset fever, facial pain, pressure, headache, general weakness and malaise, and greenish d/c, with mild ST and cough, but pt denies chest pain, wheezing, increased sob or doe, orthopnea, PND, increased LE swelling, palpitations, dizziness or syncope. Past Medical History  Diagnosis Date  . DYSLIPIDEMIA 06/24/2009  . ALLERGIC RHINITIS 10/26/2007  . ASTHMA 04/10/2010  . Factor V Leiden mutation     hx of  . Ovarian cyst   . Blood dyscrasia     factor V Leiden   . PONV (postoperative nausea and vomiting)   . Family history of anesthesia complication     PONV  . Clotting disorder    Past Surgical History  Procedure Laterality Date  . Tonsillectomy and adenoidectomy    . Wisdom tooth extraction    . Laparoscopic assisted vaginal hysterectomy  05/26/2012    Procedure: LAPAROSCOPIC ASSISTED VAGINAL HYSTERECTOMY;  Surgeon: Miguel Aschoff, MD;  Location: [facility];  Service: Gynecology;  Laterality: N/A;  . Bladder suspension  05/26/2012    Procedure: TRANSVAGINAL TAPE (TVT) PROCEDURE;  Surgeon:  Loney Laurence, MD;  Location: [facility];  Service: Gynecology;  Laterality: N/A;  . Cystoscopy  05/26/2012    Procedure: CYSTOSCOPY;  Surgeon: Loney Laurence, MD;  Location: [facility];  Service: Gynecology;  Laterality: N/A;  . Cystocele repair  05/26/2012    Procedure: ANTERIOR REPAIR (CYSTOCELE);  Surgeon: Loney Laurence, MD;  Location: [facility];  Service: Gynecology;  Laterality: N/A;  . Salpingoophorectomy  05/26/2012    Procedure: SALPINGO OOPHERECTOMY;  Surgeon: Miguel Aschoff, MD;  Location: [facility];  Service: Gynecology;  Laterality: Left;    reports that she has quit smoking. She does not have any smokeless tobacco history on file. She reports that she does not drink alcohol or use illicit drugs. family history includes Diabetes in her mother and other; Heart attack (age of onset: 24) in her mother; Heart disease in her maternal grandfather and mother; Hypertension in her maternal grandfather, mother, and other. Allergies  Allergen Reactions  . Clarithromycin     REACTION: GI upset  . Penicillins     REACTION: rash   Current Outpatient Prescriptions on File Prior to Visit  Medication Sig Dispense Refill  . atorvastatin (LIPITOR) 40 MG tablet Take 1 tablet (40 mg total) by mouth daily.  90 tablet  3  . DULERA 100-5 MCG/ACT AERO INHALE 2 PUFFS INTO THE LUNGS 2 (TWO) TIMES DAILY.  13 g  6  . DULERA 100-5 MCG/ACT AERO INHALE 2 PUFFS INTO THE LUNGS 2 (TWO) TIMES DAILY.  13 g  4  . guaiFENesin (MUCINEX) 600 MG  12 hr tablet Take 1,200 mg by mouth 2 (two) times daily as needed.       . levalbuterol (XOPENEX HFA) 45 MCG/ACT inhaler Inhale 2 puffs into the lungs every 6 (six) hours as needed for wheezing or shortness of breath.  1 Inhaler  11  . montelukast (SINGULAIR) 10 MG tablet TAKE 1 TABLET AT BEDTIME  90 tablet  3  . Probiotic Product (PROBIOTIC PO) Take by mouth daily.      . montelukast (SINGULAIR) 10 MG tablet Take 10 mg by mouth daily.       No current facility-administered  medications on file prior to visit.   Review of Systems Constitutional: Negative for diaphoresis, activity change, appetite change or unexpected weight change.  HENT: Negative for hearing loss, ear pain, facial swelling, mouth sores and neck stiffness.   Eyes: Negative for pain, redness and visual disturbance.  Respiratory: Negative for shortness of breath and wheezing.   Cardiovascular: Negative for chest pain and palpitations.  Gastrointestinal: Negative for diarrhea, blood in stool, abdominal distention or other pain Genitourinary: Negative for hematuria, flank pain or change in urine volume.  Musculoskeletal: Negative for myalgias and joint swelling.  Skin: Negative for color change and wound.  Neurological: Negative for syncope and numbness. other than noted Hematological: Negative for adenopathy.  Psychiatric/Behavioral: Negative for hallucinations, self-injury, decreased concentration and agitation.      Objective:   Physical Exam BP 102/80  Pulse 86  Temp(Src) 97.5 F (36.4 C) (Oral)  Ht 5\' 9"  (1.753 m)  Wt 213 lb 4 oz (96.73 kg)  BMI 31.48 kg/m2  SpO2 98%  LMP 04/16/2012 VS noted, mild ill Constitutional: Pt is oriented to person, place, and time. Appears well-developed and well-nourished.  Head: Normocephalic and atraumatic.  Right Ear: External ear normal.  Left Ear: External ear normal.  Nose: Nose normal.  Mouth/Throat: Oropharynx is clear and moist.  Bilat tm's with mild erythema.  Max sinus areas mild tender.  Pharynx with mild erythema, no exudate Eyes: Conjunctivae and EOM are normal. Pupils are equal, round, and reactive to light.  Neck: Normal range of motion. Neck supple. No JVD present. No tracheal deviation present.  Cardiovascular: Normal rate, regular rhythm, normal heart sounds and intact distal pulses.   Pulmonary/Chest: Effort normal and breath sounds normal.  Abdominal: Soft. Bowel sounds are normal. There is no tenderness. No HSM  Musculoskeletal:  Normal range of motion. Exhibits no edema.  Lymphadenopathy:  Has no cervical adenopathy.  Neurological: Pt is alert and oriented to person, place, and time. Pt has normal reflexes. No cranial nerve deficit.  Skin: Skin is warm and dry. No rash noted.  Psychiatric:  Has  normal mood and affect. Behavior is normal.      Assessment & Plan:

## 2013-10-12 ENCOUNTER — Other Ambulatory Visit: Payer: Self-pay

## 2013-10-12 NOTE — Assessment & Plan Note (Signed)

## 2013-10-12 NOTE — Assessment & Plan Note (Signed)
Mild to mod, for antibx course,  to f/u any worsening symptoms or concerns 

## 2013-11-07 ENCOUNTER — Other Ambulatory Visit: Payer: Self-pay | Admitting: Internal Medicine

## 2013-12-24 ENCOUNTER — Other Ambulatory Visit: Payer: Self-pay | Admitting: Internal Medicine

## 2014-07-05 ENCOUNTER — Encounter: Payer: Self-pay | Admitting: Nurse Practitioner

## 2014-07-05 ENCOUNTER — Ambulatory Visit (INDEPENDENT_AMBULATORY_CARE_PROVIDER_SITE_OTHER): Payer: BC Managed Care – PPO | Admitting: Nurse Practitioner

## 2014-07-05 VITALS — BP 132/76 | HR 91 | Temp 98.1°F | Resp 16 | Ht 68.0 in | Wt 224.0 lb

## 2014-07-05 DIAGNOSIS — J0101 Acute recurrent maxillary sinusitis: Secondary | ICD-10-CM

## 2014-07-05 DIAGNOSIS — J309 Allergic rhinitis, unspecified: Secondary | ICD-10-CM

## 2014-07-05 DIAGNOSIS — J0100 Acute maxillary sinusitis, unspecified: Secondary | ICD-10-CM

## 2014-07-05 MED ORDER — LEVOFLOXACIN 250 MG PO TABS: 250.0000 mg | ORAL_TABLET | Freq: Every day | ORAL | Status: DC

## 2014-07-05 NOTE — Assessment & Plan Note (Signed)
Recurrent acute sinus infection involving maxillary and frontal sinuses. Patient with chronic allergic symptoms despite medications Rx for Levaquin 250 mg 1 po x 10 days. Which patient has taken in the past with good effect.

## 2014-07-05 NOTE — Progress Notes (Signed)
Pre visit review using our clinic review tool, if applicable. No additional management support is needed unless otherwise documented below in the visit note. 

## 2014-07-05 NOTE — Assessment & Plan Note (Signed)
Chronic. Patient compliant with taking singular and Dulera inhaler. Avoid allergens as possible.

## 2014-07-05 NOTE — Patient Instructions (Signed)
Patient to contact clinic if symptoms worsen or not resolved.  Complete antibiotics given.  Call clinic with concerns or questions  Sinusitis Sinusitis is redness, soreness, and inflammation of the paranasal sinuses. Paranasal sinuses are air pockets within the bones of your face (beneath the eyes, the middle of the forehead, or above the eyes). In healthy paranasal sinuses, mucus is able to drain out, and air is able to circulate through them by way of your nose. However, when your paranasal sinuses are inflamed, mucus and air can become trapped. This can allow bacteria and other germs to grow and cause infection. Sinusitis can develop quickly and last only a short time (acute) or continue over a long period (chronic). Sinusitis that lasts for more than 12 weeks is considered chronic.  CAUSES  Causes of sinusitis include:  Allergies.  Structural abnormalities, such as displacement of the cartilage that separates your nostrils (deviated septum), which can decrease the air flow through your nose and sinuses and affect sinus drainage.  Functional abnormalities, such as when the small hairs (cilia) that line your sinuses and help remove mucus do not work properly or are not present. SIGNS AND SYMPTOMS  Symptoms of acute and chronic sinusitis are the same. The primary symptoms are pain and pressure around the affected sinuses. Other symptoms include:  Upper toothache.  Earache.  Headache.  Bad breath.  Decreased sense of smell and taste.  A cough, which worsens when you are lying flat.  Fatigue.  Fever.  Thick drainage from your nose, which often is green and may contain pus (purulent).  Swelling and warmth over the affected sinuses. DIAGNOSIS  Your health care provider will perform a physical exam. During the exam, your health care provider may:  Look in your nose for signs of abnormal growths in your nostrils (nasal polyps).  Tap over the affected sinus to check for signs of  infection.  View the inside of your sinuses (endoscopy) using an imaging device that has a light attached (endoscope). If your health care provider suspects that you have chronic sinusitis, one or more of the following tests may be recommended:  Allergy tests.  Nasal culture. A sample of mucus is taken from your nose, sent to a lab, and screened for bacteria.  Nasal cytology. A sample of mucus is taken from your nose and examined by your health care provider to determine if your sinusitis is related to an allergy. TREATMENT  Most cases of acute sinusitis are related to a viral infection and will resolve on their own within 10 days. Sometimes medicines are prescribed to help relieve symptoms (pain medicine, decongestants, nasal steroid sprays, or saline sprays).  However, for sinusitis related to a bacterial infection, your health care provider will prescribe antibiotic medicines. These are medicines that will help kill the bacteria causing the infection.  Rarely, sinusitis is caused by a fungal infection. In theses cases, your health care provider will prescribe antifungal medicine. For some cases of chronic sinusitis, surgery is needed. Generally, these are cases in which sinusitis recurs more than 3 times per year, despite other treatments. HOME CARE INSTRUCTIONS   Drink plenty of water. Water helps thin the mucus so your sinuses can drain more easily.  Use a humidifier.  Inhale steam 3 to 4 times a day (for example, sit in the bathroom with the shower running).  Apply a warm, moist washcloth to your face 3 to 4 times a day, or as directed by your health care provider.  Use  saline nasal sprays to help moisten and clean your sinuses.  Take medicines only as directed by your health care provider.  If you were prescribed either an antibiotic or antifungal medicine, finish it all even if you start to feel better. SEEK IMMEDIATE MEDICAL CARE IF:  You have increasing pain or severe  headaches.  You have nausea, vomiting, or drowsiness.  You have swelling around your face.  You have vision problems.  You have a stiff neck.  You have difficulty breathing. MAKE SURE YOU:   Understand these instructions.  Will watch your condition.  Will get help right away if you are not doing well or get worse. Document Released: 11/23/2005 Document Revised: 04/09/2014 Document Reviewed: 12/08/2011 [facility] Patient Information 2015 [place], [place]. This information is not intended to replace advice given to you by your health care provider. Make sure you discuss any questions you have with your health care provider.

## 2014-07-05 NOTE — Progress Notes (Signed)
Subjective:    Patient ID: Robin Ayers, female    DOB: 1976-11-30, 38 y.o.   MRN: [id number]  HPI Patient is seen for 3 week history of allergy symptoms post nasal drip, headache and now with pressure over cheeks and forehead.  Intermittent sore throat.  Denies pressure or pain in ears. Cough productive of phlegm patient not aware of color.  Denies fever and chills.  Chronic seasonal allergies despite taking Singular and Dulera.     Review of Systems  Constitutional: Positive for fatigue.  HENT: Positive for congestion, postnasal drip, rhinorrhea, sinus pressure and sneezing. Negative for ear pain, facial swelling and hearing loss.        Patient with 2-3 week hx of symptoms progressively worsening.   Eyes: Negative.   Respiratory: Negative.        Intermittent cough productive of thick phlegm not sure of color.  Cardiovascular: Negative.  Negative for chest pain and palpitations.  Skin: Negative.   Allergic/Immunologic:       Patinet with seasonal allergy symptoms currently.  Neurological: Negative.  Negative for dizziness and facial asymmetry.   Past Medical History  Diagnosis Date  . DYSLIPIDEMIA 06/24/2009  . ALLERGIC RHINITIS 10/26/2007  . ASTHMA 04/10/2010  . Factor V Leiden mutation     hx of  . Ovarian cyst   . Blood dyscrasia     factor V Leiden   . PONV (postoperative nausea and vomiting)   . Family history of anesthesia complication     PONV  . Clotting disorder     History   Social History  . Marital Status: Married    Spouse Name: N/A    Number of Children: N/A  . Years of Education: N/A   Occupational History  . Teacher [employer]   Social History Main Topics  . Smoking status: Former Research scientist (life sciences)  . Smokeless tobacco: Not on file  . Alcohol Use: No     Comment: e  . Drug Use: No  . Sexual Activity: Yes    Partners: Male    Birth Control/ Protection: Surgical     Comment: LAVH 05/2012   Other Topics Concern  . Not on file    Social History Narrative   Regular exercise-yes   Husband has had a vasectomy    Past Surgical History  Procedure Laterality Date  . Tonsillectomy and adenoidectomy    . Wisdom tooth extraction    . Laparoscopic assisted vaginal hysterectomy  05/26/2012    Procedure: LAPAROSCOPIC ASSISTED VAGINAL HYSTERECTOMY;  Surgeon: Gus Height, MD;  Location: [facility];  Service: Gynecology;  Laterality: N/A;  . Bladder suspension  05/26/2012    Procedure: TRANSVAGINAL TAPE (TVT) PROCEDURE;  Surgeon: Daria Pastures, MD;  Location: [facility];  Service: Gynecology;  Laterality: N/A;  . Cystoscopy  05/26/2012    Procedure: CYSTOSCOPY;  Surgeon: Daria Pastures, MD;  Location: [facility];  Service: Gynecology;  Laterality: N/A;  . Cystocele repair  05/26/2012    Procedure: ANTERIOR REPAIR (CYSTOCELE);  Surgeon: Daria Pastures, MD;  Location: [facility];  Service: Gynecology;  Laterality: N/A;  . Salpingoophorectomy  05/26/2012    Procedure: SALPINGO OOPHERECTOMY;  Surgeon: Gus Height, MD;  Location: [facility];  Service: Gynecology;  Laterality: Left;    Family History  Problem Relation Age of Onset  . Heart disease Mother   . Heart attack Mother 11  . Diabetes Mother   . Hypertension Mother   . Diabetes Other   .  Hypertension Other   . Hypertension Maternal Grandfather   . Heart disease Maternal Grandfather     Allergies  Allergen Reactions  . Clarithromycin     REACTION: GI upset  . Penicillins     REACTION: rash    Current Outpatient Prescriptions on File Prior to Visit  Medication Sig Dispense Refill  . atorvastatin (LIPITOR) 40 MG tablet TAKE 1 TABLET BY MOUTH EVERY DAY  90 tablet  3  . DULERA 100-5 MCG/ACT AERO INHALE 2 PUFFS INTO THE LUNGS 2 (TWO) TIMES DAILY.  13 g  6  . DULERA 100-5 MCG/ACT AERO INHALE 2 PUFFS INTO THE LUNGS 2 (TWO) TIMES DAILY.  13 g  4  . guaiFENesin (MUCINEX) 600 MG 12 hr tablet Take 1,200 mg by mouth 2 (two) times daily as needed.       . montelukast (SINGULAIR) 10  MG tablet TAKE 1 TABLET AT BEDTIME  90 tablet  3  . Probiotic Product (PROBIOTIC PO) Take by mouth daily.      Marland Kitchen levalbuterol (XOPENEX HFA) 45 MCG/ACT inhaler Inhale 2 puffs into the lungs every 6 (six) hours as needed for wheezing or shortness of breath.  1 Inhaler  11  . levofloxacin (LEVAQUIN) 250 MG tablet Take 1 tablet (250 mg total) by mouth daily.  10 tablet  0  . montelukast (SINGULAIR) 10 MG tablet Take 10 mg by mouth daily.       No current facility-administered medications on file prior to visit.    BP 132/76  Pulse 91  Temp(Src) 98.1 F (36.7 C) (Oral)  Resp 16  Ht 5\' 8"  (1.727 m)  Wt 224 lb (101.606 kg)  BMI 34.07 kg/m2  SpO2 99%  LMP 04/16/2012       Objective:   Physical Exam  Constitutional: She is oriented to person, place, and time. She appears well-developed and well-nourished. No distress.  HENT:  Head: Normocephalic.  Right Ear: External ear normal.  Oropharynx with moderate amount of whitish drainage. Mild erythema.  Pain on palpation over maxillary and frontal sinuses.  Nare with mild congestion.  Eyes: Pupils are equal, round, and reactive to light.  Neck: Normal range of motion. Neck supple.  Cardiovascular: Normal rate, regular rhythm and normal heart sounds.   No murmur heard. Pulmonary/Chest: Effort normal and breath sounds normal. No respiratory distress. She has no wheezes. She has no rales.  Neurological: She is alert and oriented to person, place, and time.  Skin: Skin is warm and dry.          Assessment & Plan:  1. Acute recurrent maxillary sinusitis Patient to rest , adequate fluids, Add Ibuprofen prn for headache.  Continue current medications.  Call clinic with symptoms not improved or worsen.  Call clinic with question or concerns.  - levofloxacin (LEVAQUIN) 250 MG tablet; Take 1 tablet (250 mg total) by mouth daily.  Dispense: 10 tablet; Refill: 0  2. ALLERGIC RHINITIS Continue singular and Dulera as prescribed.

## 2014-08-06 ENCOUNTER — Ambulatory Visit: Payer: BC Managed Care – PPO | Admitting: Obstetrics and Gynecology

## 2014-08-06 ENCOUNTER — Other Ambulatory Visit: Payer: Self-pay | Admitting: Internal Medicine

## 2014-08-13 IMAGING — MG MM DIGITAL DIAGNOSTIC BILAT
8 of 9 series · 8 of 9 positions shown · non-contrast
Comparison: None.

CLINICAL DATA: Focal pain left breast

DIGITAL DIAGNOSTIC BILATERAL MAMMOGRAM WITH CAD AND LEFT BREAST
ULTRASOUND:

[R CC (1 of 2)]
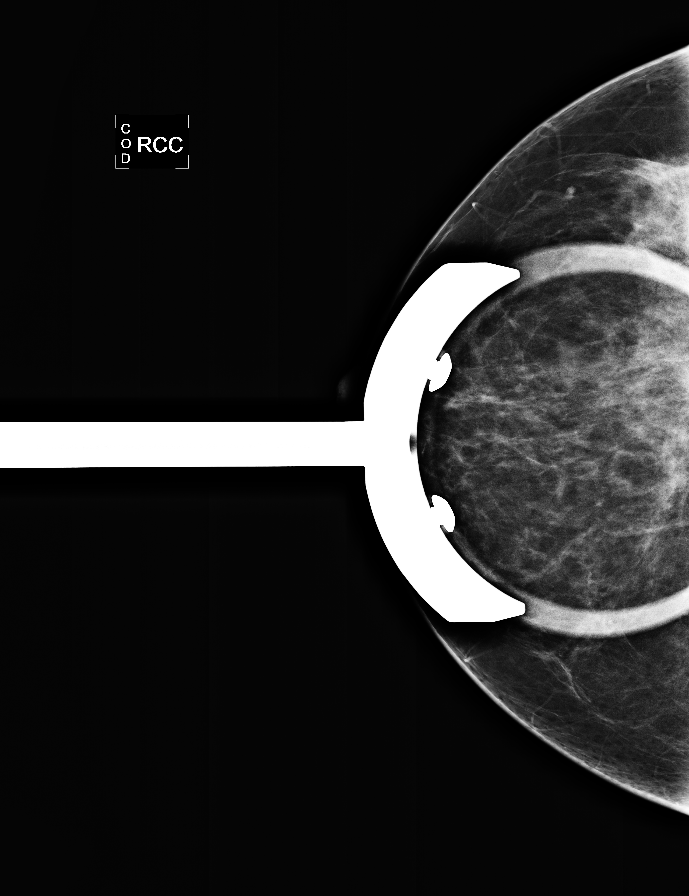

[R MLO (1 of 3)]
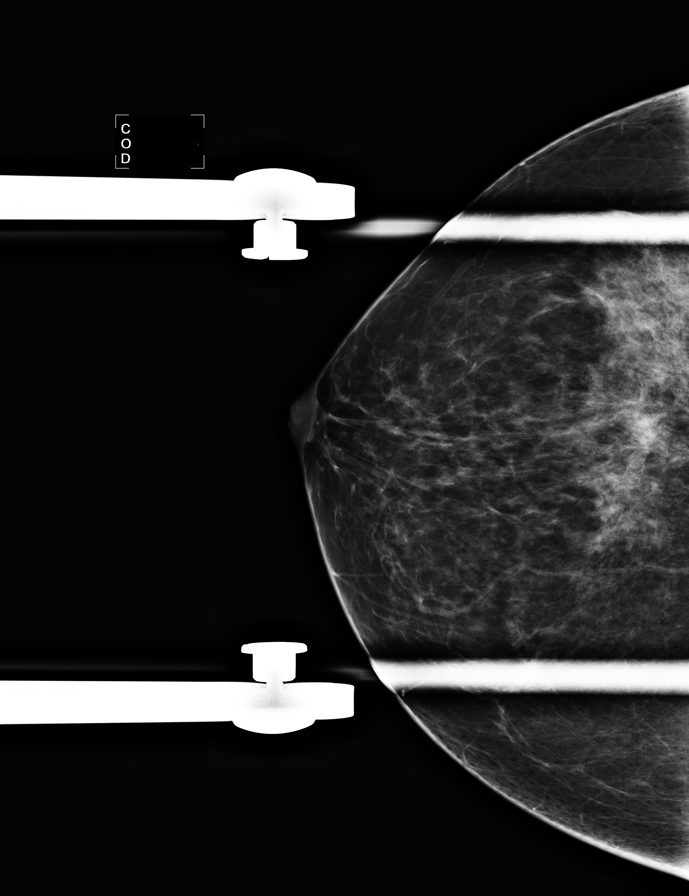

[R MLO (2 of 3)]
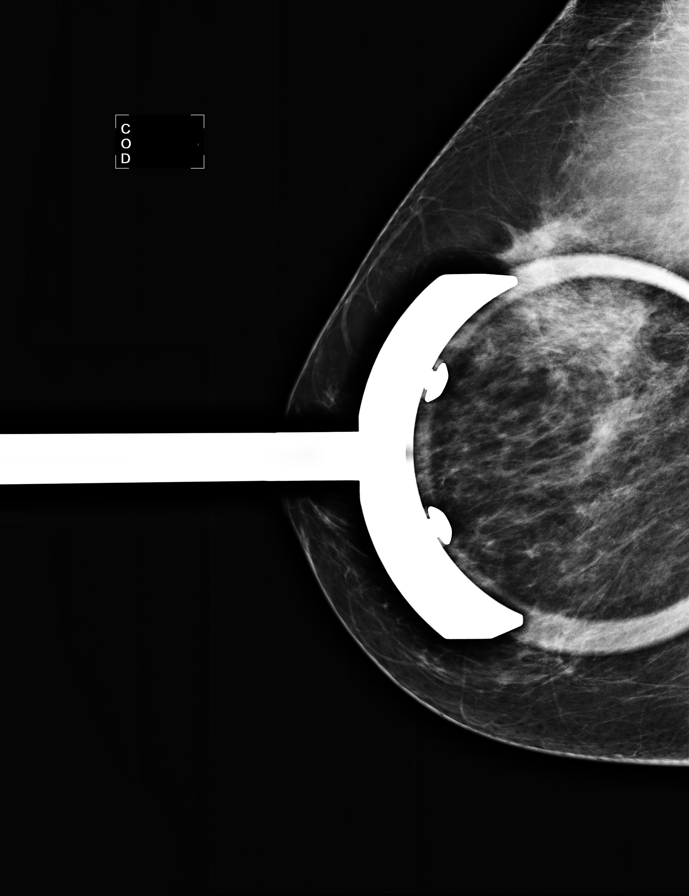

[R CC (2 of 2)]
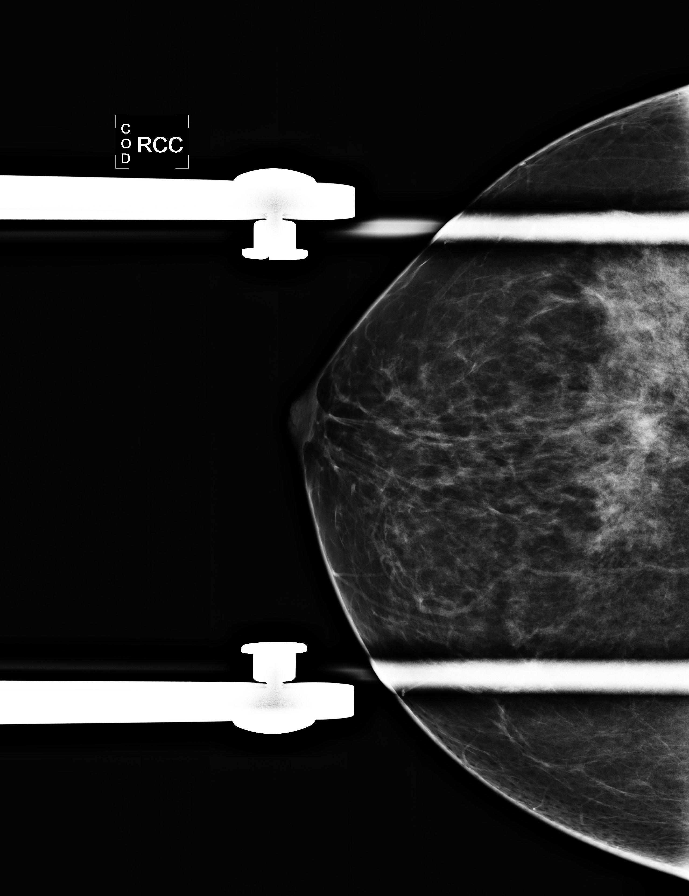

[L MLO]
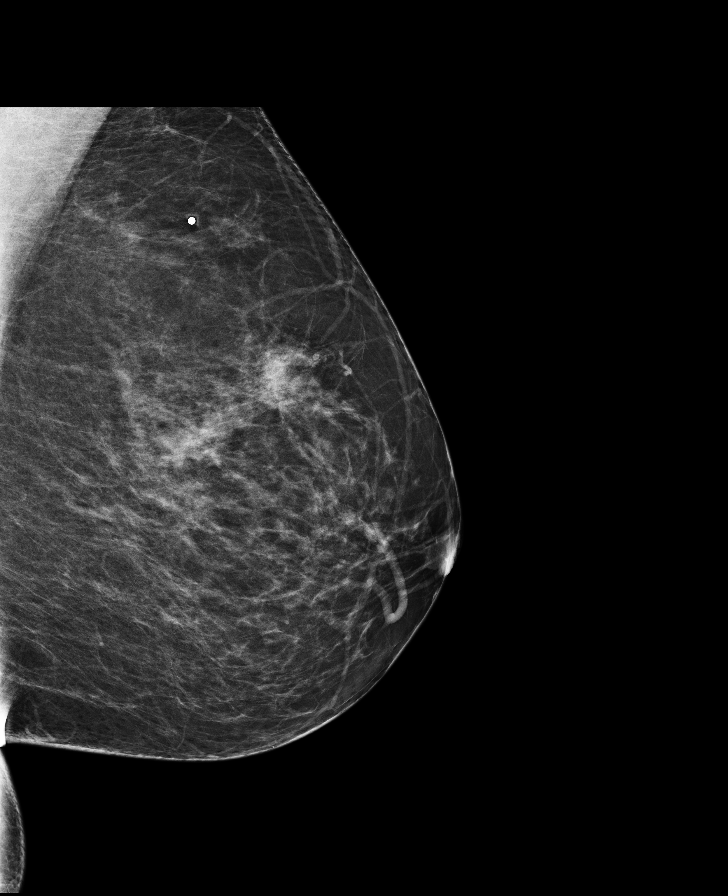

[R MLO (3 of 3)]
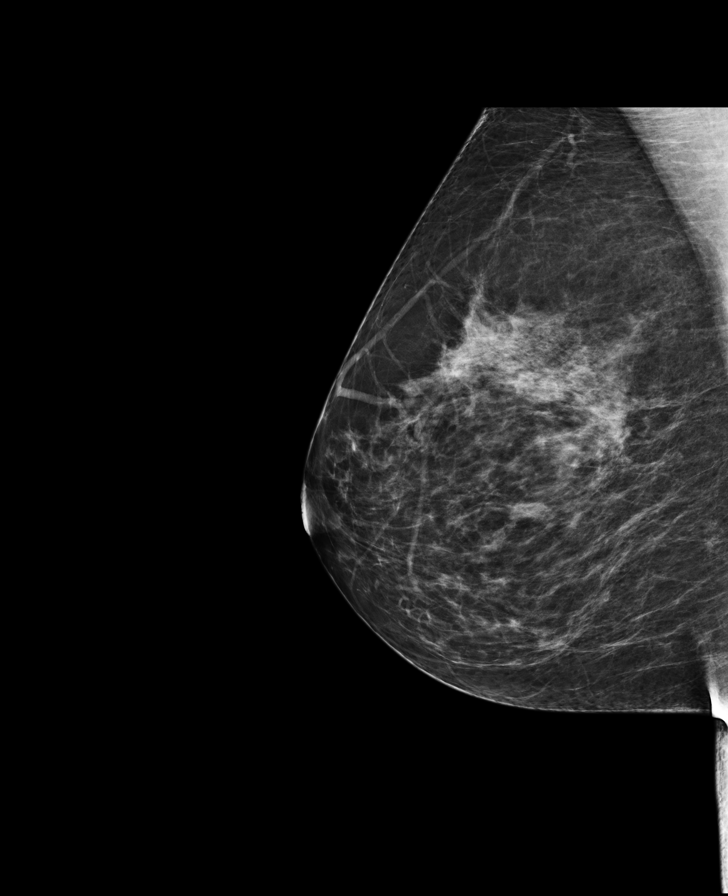

[L CC]
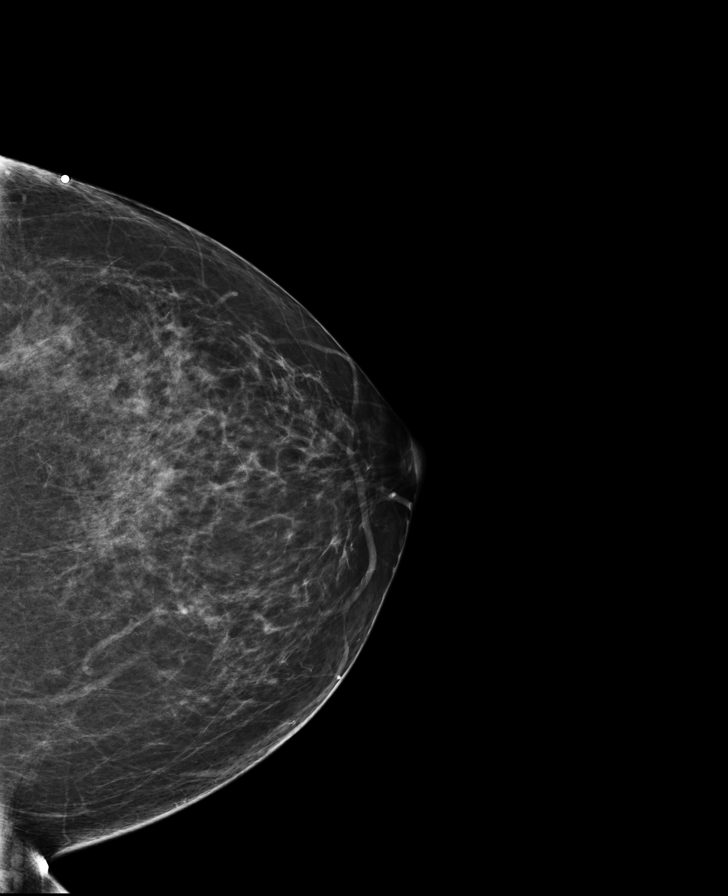

[L TAN]
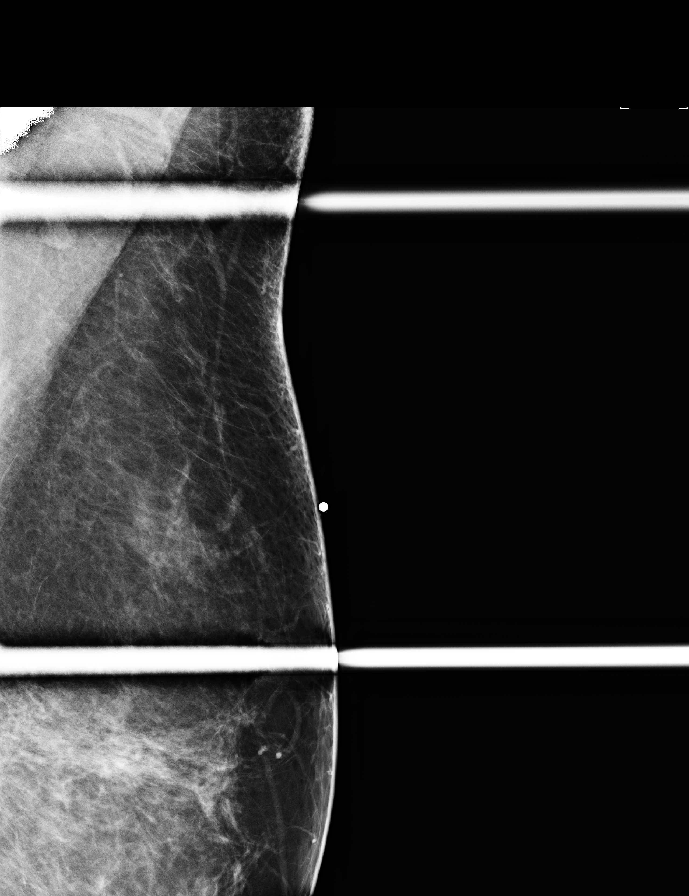

[8 of 9 positions shown; findings below may reference images not displayed]

FINDINGS: ACR Breast Density Category scattered fibroglandular tissue

CC and MLO views of bilateral breasts, spot tangential view of left
breast, spot compression CC and MLO views of the right breast are
submitted.  There is possible asymmetry in the right breast which
does not persist on additional views.  No suspicious abnormality is
identified within the left breast.

Mammographic images were processed with CAD.

Ultrasound is performed, showing no focal abnormal discrete cystic
or solid lesion in the upper outer quadrant left breast area of
pain.
IMPRESSION: Negative.

RECOMMENDATION:
Routine screening mammogram at age 40.

I have discussed the findings and recommendations with the patient.
Results were also provided in writing at the conclusion of the
visit.  If applicable, a reminder letter will be sent to the
patient regarding the next appointment.

BI-RADS CATEGORY 1:  Negative.

## 2014-08-29 ENCOUNTER — Encounter: Payer: Self-pay | Admitting: Obstetrics and Gynecology

## 2014-08-29 ENCOUNTER — Ambulatory Visit (INDEPENDENT_AMBULATORY_CARE_PROVIDER_SITE_OTHER): Payer: BC Managed Care – PPO | Admitting: Obstetrics and Gynecology

## 2014-08-29 VITALS — BP 108/62 | HR 84 | Resp 18 | Ht 68.25 in | Wt 225.0 lb

## 2014-08-29 DIAGNOSIS — Z01419 Encounter for gynecological examination (general) (routine) without abnormal findings: Secondary | ICD-10-CM

## 2014-08-29 NOTE — Progress Notes (Signed)
GYNECOLOGY VISIT  PCP: Cathlean Cower  Referring provider:   HPI: 38 y.o.   Married  Caucasian  female   [phone] with Patient's last menstrual period was 04/16/2012.   here for   Annual exam.  Has right sided pain that has occurred recently.  History of LSO with LAVH in 2013.  Taking Motrin for the pain.  Had mood swings on Micronor in the past.  Cannot take estrogen due to Factor V Leiden.   Has some pain of the left breast which is focal.  Had a bilateral diagnostic mammogram and ultrasound of the left which was normal.  Told she is due for next mammogram at age 29.   Having some bilateral leg swelling.   Hgb:  PCP Urine: PCP   GYNECOLOGIC HISTORY: Patient's last menstrual period was 04/16/2012. Sexually active:  Yes  Partner preference: Female Contraception: Hysterectomy   Menopausal hormone therapy: None DES exposure: No   Blood transfusions: No    Sexually transmitted diseases: Hx of Abnormal Pap   GYN procedures and prior surgeries:  LAVH/LSO/anterior and posterior colporrhaphy/TVT/cystoscopy - 2013 Last mammogram: Diagnostic  08/2013 BIRADS1: Neg, 08/2013 [place] Left BIRADS1: Neg              Last pap and high risk HPV testing: 05/2012 Neg   History of abnormal pap smear:  Yes, early 2000's.  No treatment.     OB History   Grav Para Term Preterm Abortions TAB SAB Ect Mult Living   4 2 2  1  1   2        LIFESTYLE: Exercise: No               OTHER HEALTH MAINTENANCE: Tetanus/TDap: 2009 HPV: No Influenza: No   Bone density:N/A Colonoscopy:N/A  Cholesterol check: 10/2013  Family History  Problem Relation Age of Onset  . Heart disease Mother   . Heart attack Mother 23  . Diabetes Mother   . Hypertension Mother   . Diabetes Other   . Hypertension Other   . Hypertension Maternal Grandfather   . Heart disease Maternal Grandfather     Patient Active Problem List   Diagnosis Date Noted  . Acute sinus infection 10/11/2013  . Factor V Leiden mutation   .  Preventative health care 07/06/2011  . ASTHMA 04/10/2010  . Tenosynovitis of foot and ankle 10/11/2009  . DYSLIPIDEMIA 06/24/2009  . HAND PAIN, BILATERAL 06/24/2009  . FATIGUE 06/24/2009  . URINARY INCONTINENCE 06/24/2009  . ALLERGIC RHINITIS 10/26/2007   Past Medical History  Diagnosis Date  . DYSLIPIDEMIA 06/24/2009  . ALLERGIC RHINITIS 10/26/2007  . ASTHMA 04/10/2010  . Factor V Leiden mutation     hx of  . Ovarian cyst   . Blood dyscrasia     factor V Leiden   . PONV (postoperative nausea and vomiting)   . Family history of anesthesia complication     PONV  . Clotting disorder     Past Surgical History  Procedure Laterality Date  . Tonsillectomy and adenoidectomy    . Wisdom tooth extraction    . Laparoscopic assisted vaginal hysterectomy  05/26/2012    Procedure: LAPAROSCOPIC ASSISTED VAGINAL HYSTERECTOMY;  Surgeon: Gus Height, MD;  Location: [facility];  Service: Gynecology;  Laterality: N/A;  . Bladder suspension  05/26/2012    Procedure: TRANSVAGINAL TAPE (TVT) PROCEDURE;  Surgeon: Daria Pastures, MD;  Location: [facility];  Service: Gynecology;  Laterality: N/A;  . Cystoscopy  05/26/2012    Procedure: CYSTOSCOPY;  Surgeon: Daria Pastures, MD;  Location: [facility];  Service: Gynecology;  Laterality: N/A;  . Cystocele repair  05/26/2012    Procedure: ANTERIOR REPAIR (CYSTOCELE);  Surgeon: Daria Pastures, MD;  Location: [facility];  Service: Gynecology;  Laterality: N/A;  . Salpingoophorectomy  05/26/2012    Procedure: SALPINGO OOPHERECTOMY;  Surgeon: Gus Height, MD;  Location: [facility];  Service: Gynecology;  Laterality: Left;    ALLERGIES: Clarithromycin and Penicillins  Current Outpatient Prescriptions  Medication Sig Dispense Refill  . atorvastatin (LIPITOR) 40 MG tablet TAKE 1 TABLET BY MOUTH EVERY DAY  90 tablet  3  . DULERA 100-5 MCG/ACT AERO INHALE 2 PUFFS INTO THE LUNGS 2 (TWO) TIMES DAILY.  13 g  6  . guaiFENesin (MUCINEX) 600 MG 12 hr tablet Take 1,200 mg by mouth 2  (two) times daily as needed.       . montelukast (SINGULAIR) 10 MG tablet TAKE 1 TABLET AT BEDTIME  90 tablet  3  . Probiotic Product (PROBIOTIC PO) Take by mouth daily.      Marland Kitchen levalbuterol (XOPENEX HFA) 45 MCG/ACT inhaler Inhale 2 puffs into the lungs every 6 (six) hours as needed for wheezing or shortness of breath.  1 Inhaler  11   No current facility-administered medications for this visit.     ROS:  Pertinent items are noted in HPI.  History   Social History  . Marital Status: Married    Spouse Name: N/A    Number of Children: N/A  . Years of Education: N/A   Occupational History  . Teacher [employer]   Social History Main Topics  . Smoking status: Former Research scientist (life sciences)  . Smokeless tobacco: Never Used  . Alcohol Use: No     Comment: e  . Drug Use: No  . Sexual Activity: Yes    Partners: Male    Birth Control/ Protection: Surgical     Comment: LAVH 05/2012   Other Topics Concern  . Not on file   Social History Narrative   Regular exercise-yes   Husband has had a vasectomy    PHYSICAL EXAMINATION:    BP 108/62  Pulse 84  Resp 18  Ht 5' 8.25" (1.734 m)  Wt 225 lb (102.059 kg)  BMI 33.94 kg/m2  LMP 04/16/2012   Wt Readings from Last 3 Encounters:  08/29/14 225 lb (102.059 kg)  07/05/14 224 lb (101.606 kg)  10/11/13 213 lb 4 oz (96.73 kg)     Ht Readings from Last 3 Encounters:  08/29/14 5' 8.25" (1.734 m)  07/05/14 5\' 8"  (1.727 m)  10/11/13 5\' 9"  (1.753 m)    General appearance: alert, cooperative and appears stated age Head: Normocephalic, without obvious abnormality, atraumatic Neck: no adenopathy, supple, symmetrical, trachea midline and thyroid not enlarged, symmetric, no tenderness/mass/nodules Lungs: clear to auscultation bilaterally Breasts: Inspection negative, No nipple retraction or dimpling, No nipple discharge or bleeding, No axillary or supraclavicular adenopathy, Normal to palpation without dominant masses Heart: regular rate and  rhythm Abdomen: soft, non-tender; no masses,  no organomegaly Extremities: extremities normal, atraumatic, no cyanosis, bilateral edema of lower extremities - 1+ Skin: Skin color, texture, turgor normal. No rashes or lesions Lymph nodes: Cervical, supraclavicular, and axillary nodes normal. No abnormal inguinal nodes palpated Neurologic: Grossly normal  Pelvic: External genitalia:  no lesions              Urethra:  normal appearing urethra with no masses, tenderness or lesions  Bartholins and Skenes: normal                 Vagina: normal appearing vagina with normal color and discharge, no lesions              Cervix:  absent              Pap and high risk HPV testing done: No.        Bimanual Exam:  Uterus:   absent                                      Adnexa: normal adnexa in size, nontender and no masses                                     ASSESSMENT  Normal gynecologic exam. Status post LAVH/LSO/TVT. RLQ pain. History of Factor V Leiden mutation.  Bilateral lower extremity edema.   PLAN  Mammogram recommended yearly starting at age 77. Pap smear and high risk HPV testing as above. Counseled on self breast exam, exercise. See lab orders: No. Will observe pain and have patient return for pelvic ultrasound prn.  Will follow up with PCP regarding edema. Return annually or prn   An After Visit Summary was printed and given to the patient.

## 2014-08-29 NOTE — Patient Instructions (Signed)

## 2014-10-08 ENCOUNTER — Encounter: Payer: Self-pay | Admitting: Obstetrics and Gynecology

## 2014-11-20 ENCOUNTER — Other Ambulatory Visit: Payer: Self-pay | Admitting: Internal Medicine

## 2014-12-22 ENCOUNTER — Other Ambulatory Visit: Payer: Self-pay | Admitting: Internal Medicine

## 2015-02-04 ENCOUNTER — Ambulatory Visit (INDEPENDENT_AMBULATORY_CARE_PROVIDER_SITE_OTHER): Payer: BC Managed Care – PPO | Admitting: Family

## 2015-02-04 ENCOUNTER — Encounter: Payer: Self-pay | Admitting: Family

## 2015-02-04 VITALS — BP 138/90 | HR 93 | Temp 98.4°F | Resp 18 | Ht 69.0 in | Wt 237.8 lb

## 2015-02-04 DIAGNOSIS — J0101 Acute recurrent maxillary sinusitis: Secondary | ICD-10-CM

## 2015-02-04 MED ORDER — MOMETASONE FUROATE 50 MCG/ACT NA SUSP: 2.0000 | Freq: Every day | NASAL | Status: DC

## 2015-02-04 MED ORDER — LEVOFLOXACIN 500 MG PO TABS: 500.0000 mg | ORAL_TABLET | Freq: Every day | ORAL | Status: DC

## 2015-02-04 MED ORDER — CODEINE POLST-CHLORPHEN POLST 14.7-2.8 MG/5ML PO LQCR: 10.0000 mL | Freq: Two times a day (BID) | ORAL | Status: DC

## 2015-02-04 NOTE — Progress Notes (Signed)
Pre visit review using our clinic review tool, if applicable. No additional management support is needed unless otherwise documented below in the visit note. 

## 2015-02-04 NOTE — Progress Notes (Signed)
   Subjective:    Patient ID: Robin Ayers, female    DOB: 1976-11-10, 39 y.o.   MRN: [id number]  Chief Complaint  Patient presents with  . Cough    x2 weeks, started with sore throat, now has productive cough, congestions, drainage and sinus pressure    HPI:  Robin Ayers is a 39 y.o. female who presents today for an acute visit.   This is a new problem. Associated symptoms of sore throat, productive cough, congestion,  drainage, and sinus pressure have been going on for 2 weeks. Has tried mucinex which has provided some relief.    Allergies  Allergen Reactions  . Clarithromycin     REACTION: GI upset  . Penicillins     REACTION: rash    Current Outpatient Prescriptions on File Prior to Visit  Medication Sig Dispense Refill  . atorvastatin (LIPITOR) 40 MG tablet TAKE 1 TABLET BY MOUTH EVERY DAY 90 tablet 0  . DULERA 100-5 MCG/ACT AERO INHALE 2 PUFFS INTO THE LUNGS 2 (TWO) TIMES DAILY. 13 g 6  . guaiFENesin (MUCINEX) 600 MG 12 hr tablet Take 1,200 mg by mouth 2 (two) times daily as needed.     . montelukast (SINGULAIR) 10 MG tablet Take 1 tablet (10 mg total) by mouth at bedtime. 90 tablet 0  . Probiotic Product (PROBIOTIC PO) Take by mouth daily.    Marland Kitchen levalbuterol (XOPENEX HFA) 45 MCG/ACT inhaler Inhale 2 puffs into the lungs every 6 (six) hours as needed for wheezing or shortness of breath. 1 Inhaler 11   No current facility-administered medications on file prior to visit.    Review of Systems  Constitutional: Negative for fever and chills.  HENT: Positive for congestion, ear pain, sinus pressure and sore throat.   Respiratory: Positive for cough. Negative for chest tightness and shortness of breath.   Neurological: Positive for dizziness.      Objective:    BP 138/90 mmHg  Pulse 93  Temp(Src) 98.4 F (36.9 C) (Oral)  Resp 18  Ht 5\' 9"  (1.753 m)  Wt 237 lb 12.8 oz (107.865 kg)  BMI 35.10 kg/m2  SpO2 97%  LMP 04/16/2012 Nursing note and  vital signs reviewed.  Physical Exam  Constitutional: She is oriented to person, place, and time. She appears well-developed and well-nourished. No distress.  HENT:  Right Ear: Hearing, tympanic membrane, external ear and ear canal normal.  Left Ear: Hearing, tympanic membrane, external ear and ear canal normal.  Nose: Right sinus exhibits maxillary sinus tenderness and frontal sinus tenderness. Left sinus exhibits maxillary sinus tenderness and frontal sinus tenderness.  Mouth/Throat: Uvula is midline, oropharynx is clear and moist and mucous membranes are normal.  Neck: Neck supple.  Cardiovascular: Normal rate, regular rhythm, normal heart sounds and intact distal pulses.   Pulmonary/Chest: Effort normal and breath sounds normal.  Lymphadenopathy:    She has no cervical adenopathy.  Neurological: She is alert and oriented to person, place, and time.  Skin: Skin is warm and dry.  Psychiatric: She has a normal mood and affect. Her behavior is normal. Judgment and thought content normal.       Assessment & Plan:

## 2015-02-04 NOTE — Patient Instructions (Signed)
Thank you for choosing [facility].  Summary/Instructions:  Your prescription(s) have been submitted to your pharmacy or been printed and provided for you. Please take as directed and contact our office if you believe you are having problem(s) with the medication(s) or have any questions.  If your symptoms worsen or fail to improve, please contact our office for further instruction, or in case of emergency go directly to the emergency room at the closest medical facility.   General Recommendations:    Please drink plenty of fluids.  Get plenty of rest   Sleep in humidified air  Use saline nasal sprays  Netti pot   OTC Medications:  Decongestants - helps relieve congestion   Flonase (generic fluticasone) or Nasacort (generic triamcinolone) - please make sure to use the "cross-over" technique at a 45 degree angle towards the opposite eye as opposed to straight up the nasal passageway.   Sudafed (generic pseudoephedrine - Note this is the one that is available behind the pharmacy counter); Products with phenylephrine (-PE) may also be used but is often not as effective as pseudoephedrine.   If you have HIGH BLOOD PRESSURE - Coricidin HBP; AVOID any product that is -D as this contains pseudoephedrine which may increase your blood pressure.  Afrin (oxymetazoline) every 6-8 hours for up to 3 days.   Allergies - helps relieve runny nose, itchy eyes and sneezing   Claritin (generic loratidine), Allegra (fexofenidine), or Zyrtec (generic cyrterizine) for runny nose. These medications should not cause drowsiness.  Note - Benadryl (generic diphenhydramine) may be used however may cause drowsiness  Cough -   Delsym or Robitussin (generic dextromethorphan)  Expectorants - helps loosen mucus to ease removal   Mucinex (generic guaifenesin) as directed on the package.  Headaches / General Aches   Tylenol (generic acetaminophen) - DO NOT EXCEED 3 grams (3,000 mg) in a 24  hour time period  Advil/Motrin (generic ibuprofen)   Sore Throat -   Salt water gargle   Chloraseptic (generic benzocaine) spray or lozenges / Sucrets (generic dyclonine)      

## 2015-02-04 NOTE — Assessment & Plan Note (Signed)
Symptoms and exam consistent with bacterial sinusitis. Start Levaquin 7 days. Start Tuzistra XR for cough and sleep. Continue over-the-counter medications as needed for symptom relief and supportive care. Follow-up if symptoms worsen or fail to improve

## 2015-03-01 ENCOUNTER — Other Ambulatory Visit: Payer: Self-pay | Admitting: Internal Medicine

## 2015-03-12 ENCOUNTER — Other Ambulatory Visit: Payer: Self-pay | Admitting: Internal Medicine

## 2015-03-17 ENCOUNTER — Other Ambulatory Visit: Payer: Self-pay | Admitting: Internal Medicine

## 2015-04-14 ENCOUNTER — Other Ambulatory Visit: Payer: Self-pay | Admitting: Internal Medicine

## 2015-04-28 ENCOUNTER — Other Ambulatory Visit: Payer: Self-pay | Admitting: Internal Medicine

## 2015-05-31 ENCOUNTER — Other Ambulatory Visit: Payer: Self-pay | Admitting: Internal Medicine

## 2015-06-04 ENCOUNTER — Ambulatory Visit (INDEPENDENT_AMBULATORY_CARE_PROVIDER_SITE_OTHER): Payer: BC Managed Care – PPO | Admitting: Internal Medicine

## 2015-06-04 ENCOUNTER — Encounter: Payer: Self-pay | Admitting: Internal Medicine

## 2015-06-04 ENCOUNTER — Other Ambulatory Visit (INDEPENDENT_AMBULATORY_CARE_PROVIDER_SITE_OTHER): Payer: BC Managed Care – PPO

## 2015-06-04 VITALS — BP 128/88 | HR 86 | Temp 98.1°F | Ht 69.0 in | Wt 233.0 lb

## 2015-06-04 DIAGNOSIS — Z0000 Encounter for general adult medical examination without abnormal findings: Secondary | ICD-10-CM | POA: Diagnosis not present

## 2015-06-04 LAB — CBC WITH DIFFERENTIAL/PLATELET
BASOS ABS: 0 10*3/uL (ref 0.0–0.1)
Basophils Relative: 0.6 % (ref 0.0–3.0)
EOS ABS: 0.3 10*3/uL (ref 0.0–0.7)
Eosinophils Relative: 4.6 % (ref 0.0–5.0)
HCT: 42.7 % (ref 36.0–46.0)
HEMOGLOBIN: 14.4 g/dL (ref 12.0–15.0)
LYMPHS ABS: 1.4 10*3/uL (ref 0.7–4.0)
Lymphocytes Relative: 19.8 % (ref 12.0–46.0)
MCHC: 33.8 g/dL (ref 30.0–36.0)
MCV: 86.3 fl (ref 78.0–100.0)
MONOS PCT: 6.4 % (ref 3.0–12.0)
Monocytes Absolute: 0.4 10*3/uL (ref 0.1–1.0)
NEUTROS PCT: 68.6 % (ref 43.0–77.0)
Neutro Abs: 4.8 10*3/uL (ref 1.4–7.7)
Platelets: 271 10*3/uL (ref 150.0–400.0)
RBC: 4.95 Mil/uL (ref 3.87–5.11)
RDW: 13.1 % (ref 11.5–15.5)
WBC: 6.9 10*3/uL (ref 4.0–10.5)

## 2015-06-04 LAB — HEPATIC FUNCTION PANEL
ALT: 10 U/L (ref 0–35)
AST: 11 U/L (ref 0–37)
Albumin: 4.2 g/dL (ref 3.5–5.2)
Alkaline Phosphatase: 115 U/L (ref 39–117)
BILIRUBIN DIRECT: 0 mg/dL (ref 0.0–0.3)
Total Bilirubin: 0.5 mg/dL (ref 0.2–1.2)
Total Protein: 7 g/dL (ref 6.0–8.3)

## 2015-06-04 LAB — TSH: TSH: 1.58 u[IU]/mL (ref 0.35–4.50)

## 2015-06-04 LAB — URINALYSIS, ROUTINE W REFLEX MICROSCOPIC
Bilirubin Urine: NEGATIVE
KETONES UR: NEGATIVE
LEUKOCYTES UA: NEGATIVE
Nitrite: NEGATIVE
PH: 6 (ref 5.0–8.0)
Specific Gravity, Urine: 1.01 (ref 1.000–1.030)
Total Protein, Urine: NEGATIVE
Urine Glucose: NEGATIVE
Urobilinogen, UA: 0.2 (ref 0.0–1.0)
WBC UA: NONE SEEN (ref 0–?)

## 2015-06-04 LAB — BASIC METABOLIC PANEL
BUN: 12 mg/dL (ref 6–23)
CALCIUM: 9.2 mg/dL (ref 8.4–10.5)
CO2: 25 mEq/L (ref 19–32)
Chloride: 107 mEq/L (ref 96–112)
Creatinine, Ser: 0.84 mg/dL (ref 0.40–1.20)
GFR: 80.32 mL/min (ref >60.00)
Glucose, Bld: 103 mg/dL — ABNORMAL HIGH (ref 70–99)
Potassium: 4.8 mEq/L (ref 3.5–5.1)
SODIUM: 139 meq/L (ref 135–145)

## 2015-06-04 LAB — LIPID PANEL
Cholesterol: 218 mg/dL — ABNORMAL HIGH (ref 0–200)
HDL: 41.3 mg/dL (ref >39.00)
LDL CALC: 153 mg/dL — AB (ref 0–99)
NonHDL: 176.7
Total CHOL/HDL Ratio: 5
Triglycerides: 120 mg/dL (ref 0.0–149.0)
VLDL: 24 mg/dL (ref 0.0–40.0)

## 2015-06-04 NOTE — Progress Notes (Signed)
Subjective:    Patient ID: Robin Ayers, female    DOB: 07/04/1976, 39 y.o.   MRN: [id number]  HPI    Here for wellness and f/u;  Overall doing ok;  Pt denies Chest pain, worsening SOB, DOE, wheezing, orthopnea, PND, worsening LE edema, palpitations, dizziness or syncope.  Pt denies neurological change such as new headache, facial or extremity weakness.  Pt denies polydipsia, polyuria, or low sugar symptoms. Pt states overall good compliance with treatment and medications, good tolerability, and has been trying to follow appropriate diet.  Pt denies worsening depressive symptoms, suicidal ideation or panic. No fever, night sweats, wt loss, loss of appetite, or other constitutional symptoms.  Pt states good ability with ADL's, has low fall risk, home safety reviewed and adequate, no other significant changes in hearing or vision, and since mar 2013 more active with exercise 3-5 times per wk, and working on diet. Has held off on statin to see how much affected chol. No recent TSH done. Hard ot lose wt more than she has.  Has some fatigue and could a nap every day, but not clear about snoring or stopping breathing . No missed work, no falling asleep.  Wt Readings from Last 3 Encounters:  06/04/15 233 lb (105.688 kg)  02/04/15 237 lb 12.8 oz (107.865 kg)  08/29/14 225 lb (102.059 kg)  oct 2013 - wt 205 Past Medical History  Diagnosis Date  . DYSLIPIDEMIA 06/24/2009  . ALLERGIC RHINITIS 10/26/2007  . ASTHMA 04/10/2010  . Factor V Leiden mutation     hx of  . Ovarian cyst   . Blood dyscrasia     factor V Leiden   . PONV (postoperative nausea and vomiting)   . Family history of anesthesia complication     PONV  . Clotting disorder    Past Surgical History  Procedure Laterality Date  . Tonsillectomy and adenoidectomy    . Wisdom tooth extraction    . Laparoscopic assisted vaginal hysterectomy  05/26/2012    Procedure: LAPAROSCOPIC ASSISTED VAGINAL HYSTERECTOMY;  Surgeon: Gus Height, MD;   Location: [facility];  Service: Gynecology;  Laterality: N/A;  . Bladder suspension  05/26/2012    Procedure: TRANSVAGINAL TAPE (TVT) PROCEDURE;  Surgeon: Daria Pastures, MD;  Location: [facility];  Service: Gynecology;  Laterality: N/A;  . Cystoscopy  05/26/2012    Procedure: CYSTOSCOPY;  Surgeon: Daria Pastures, MD;  Location: [facility];  Service: Gynecology;  Laterality: N/A;  . Cystocele repair  05/26/2012    Procedure: ANTERIOR REPAIR (CYSTOCELE);  Surgeon: Daria Pastures, MD;  Location: [facility];  Service: Gynecology;  Laterality: N/A;  . Salpingoophorectomy  05/26/2012    Procedure: SALPINGO OOPHERECTOMY;  Surgeon: Gus Height, MD;  Location: [facility];  Service: Gynecology;  Laterality: Left;    reports that she has quit smoking. She has never used smokeless tobacco. She reports that she does not drink alcohol or use illicit drugs. family history includes Diabetes in her mother and other; Heart attack (age of onset: 66) in her mother; Heart disease in her maternal grandfather and mother; Hypertension in her maternal grandfather, mother, and other. Allergies  Allergen Reactions  . Clarithromycin     REACTION: GI upset  . Penicillins     REACTION: rash   Current Outpatient Prescriptions on File Prior to Visit  Medication Sig Dispense Refill  . DULERA 100-5 MCG/ACT AERO INHALE 2 PUFFS INTO THE LUNGS TWICE A DAY 13 Inhaler 3  . mometasone (NASONEX) 50 MCG/ACT  nasal spray Place 2 sprays into the nose daily. 17 g 4  . montelukast (SINGULAIR) 10 MG tablet Take 1 tablet (10 mg total) by mouth at bedtime. 30 tablet 3  . Probiotic Product (PROBIOTIC PO) Take by mouth daily.    Marland Kitchen atorvastatin (LIPITOR) 40 MG tablet TAKE 1 TABLET BY MOUTH EVERY DAY (Patient not taking: Reported on 06/04/2015) 30 tablet 0  . Codeine Polst-Chlorphen Polst (TUZISTRA XR) 14.7-2.8 MG/5ML LQCR Take 10 mLs by mouth every 12 (twelve) hours. (Patient not taking: Reported on 06/04/2015) 180 mL 0  . guaiFENesin (MUCINEX) 600 MG 12  hr tablet Take 1,200 mg by mouth 2 (two) times daily as needed.     . levalbuterol (XOPENEX HFA) 45 MCG/ACT inhaler Inhale 2 puffs into the lungs every 6 (six) hours as needed for wheezing or shortness of breath. 1 Inhaler 11  . levofloxacin (LEVAQUIN) 500 MG tablet Take 1 tablet (500 mg total) by mouth daily. (Patient not taking: Reported on 06/04/2015) 7 tablet 0   No current facility-administered medications on file prior to visit.   Review of Systems Constitutional: Negative for increased diaphoresis, other activity, appetite or siginficant weight change other than noted HENT: Negative for worsening hearing loss, ear pain, facial swelling, mouth sores and neck stiffness.   Eyes: Negative for other worsening pain, redness or visual disturbance.  Respiratory: Negative for shortness of breath and wheezing  Cardiovascular: Negative for chest pain and palpitations.  Gastrointestinal: Negative for diarrhea, blood in stool, abdominal distention or other pain Genitourinary: Negative for hematuria, flank pain or change in urine volume.  Musculoskeletal: Negative for myalgias or other joint complaints.  Skin: Negative for color change and wound or drainage.  Neurological: Negative for syncope and numbness. other than noted Hematological: Negative for adenopathy. or other swelling Psychiatric/Behavioral: Negative for hallucinations, SI, self-injury, decreased concentration or other worsening agitation.      Objective:   Physical Exam BP 128/88 mmHg  Pulse 86  Temp(Src) 98.1 F (36.7 C) (Oral)  Ht 5\' 9"  (1.753 m)  Wt 233 lb (105.688 kg)  BMI 34.39 kg/m2  SpO2 98%  LMP 04/16/2012 BP Readings from Last 3 Encounters:  06/04/15 128/88  02/04/15 138/90  08/29/14 108/62  VS noted,  Constitutional: Pt is oriented to person, place, and time. Appears well-developed and well-nourished, in no significant distress Head: Normocephalic and atraumatic.  Right Ear: External ear normal.  Left Ear:  External ear normal.  Nose: Nose normal.  Mouth/Throat: Oropharynx is clear and moist.  Eyes: Conjunctivae and EOM are normal. Pupils are equal, round, and reactive to light.  Neck: Normal range of motion. Neck supple. No JVD present. No tracheal deviation present or significant neck LA or mass Cardiovascular: Normal rate, regular rhythm, normal heart sounds and intact distal pulses.   Pulmonary/Chest: Effort normal and breath sounds without rales or wheezing  Abdominal: Soft. Bowel sounds are normal. NT. No HSM  Musculoskeletal: Normal range of motion. Exhibits no edema.  Lymphadenopathy:  Has no cervical adenopathy.  Neurological: Pt is alert and oriented to person, place, and time. Pt has normal reflexes. No cranial nerve deficit. Motor grossly intact Skin: Skin is warm and dry. No rash noted.  Psychiatric:  Has normal mood and affect. Behavior is normal.     Assessment & Plan:

## 2015-06-04 NOTE — Patient Instructions (Signed)

## 2015-06-04 NOTE — Assessment & Plan Note (Signed)
Overall doing well, age appropriate education and counseling updated, referrals for preventative services and immunizations addressed, dietary and smoking counseling addressed, most recent labs reviewed.  I have personally reviewed and have noted:  1) the patient's medical and social history 2) The pt's use of alcohol, tobacco, and illicit drugs 3) The patient's current medications and supplements 4) Functional ability including ADL's, fall risk, home safety risk, hearing and visual impairment 5) Diet and physical activities 6) Evidence for depression or mood disorder 7) The patient's height, weight, and BMI have been recorded in the chart  I have made referrals, and provided counseling and education based on review of the above For labs today,  to f/u any worsening symptoms or concerns

## 2015-06-04 NOTE — Progress Notes (Signed)
Pre visit review using our clinic review tool, if applicable. No additional management support is needed unless otherwise documented below in the visit note. 

## 2015-06-13 ENCOUNTER — Other Ambulatory Visit: Payer: Self-pay | Admitting: Internal Medicine

## 2015-06-17 ENCOUNTER — Encounter: Payer: Self-pay | Admitting: Internal Medicine

## 2015-07-01 ENCOUNTER — Other Ambulatory Visit: Payer: Self-pay | Admitting: Internal Medicine

## 2015-08-06 ENCOUNTER — Telehealth: Payer: Self-pay | Admitting: Obstetrics and Gynecology

## 2015-08-06 NOTE — Telephone Encounter (Signed)
Left message regarding upcoming appointment has been canceled and needs to be rescheduled. °

## 2015-09-13 ENCOUNTER — Ambulatory Visit: Payer: BC Managed Care – PPO | Admitting: Obstetrics and Gynecology

## 2015-09-26 ENCOUNTER — Encounter: Payer: Self-pay | Admitting: Obstetrics and Gynecology

## 2015-09-26 ENCOUNTER — Ambulatory Visit (INDEPENDENT_AMBULATORY_CARE_PROVIDER_SITE_OTHER): Payer: BC Managed Care – PPO | Admitting: Obstetrics and Gynecology

## 2015-09-26 VITALS — BP 120/80 | HR 76 | Resp 14 | Ht 68.25 in | Wt 216.0 lb

## 2015-09-26 DIAGNOSIS — Z01419 Encounter for gynecological examination (general) (routine) without abnormal findings: Secondary | ICD-10-CM

## 2015-09-26 NOTE — Progress Notes (Signed)
Patient ID: Robin Ayers, female   DOB: [birth year], 39 y.o.   MRN: [id number] 39 y.o. [id number] MarriedCaucasianF here for annual exam.  History of LSO with LAVH in 2013. No vaginal bleeding. Sexually active, no pain. No vasomotor symptoms or vaginal dryness. She has a h/o DVT at 49, factor V leiden.     Patient's last menstrual period was 04/16/2012.          Sexually active: Yes.    The current method of family planning is status post hysterectomy.    Exercising: Yes.    walking Smoker:  Former smoker  Health Maintenance: Pap:  05/2012 WNL History of abnormal Pap:  Yes years ago at 19, had a colposcopy, never had cervical surgery.  MMG:  08-22-13- WNL routine at age 34 Colonoscopy:  Never BMD:   Never TDaP:  2009 Gardasil: N/A   reports that she has quit smoking. She has never used smokeless tobacco. She reports that she does not drink alcohol or use illicit drugs.2 children, 20 and 6, both girls. Oldest is in college at [employer] in [employer] doctor. Youngest in 1st grade. 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   Past Medical History  Diagnosis Date  . DYSLIPIDEMIA 06/24/2009  . ALLERGIC RHINITIS 10/26/2007  . ASTHMA 04/10/2010  . Factor V Leiden mutation ([place])     hx of  . Ovarian cyst   . Blood dyscrasia     factor V Leiden   . PONV (postoperative nausea and vomiting)   . Family history of anesthesia complication     PONV  . Clotting disorder [facility])     Past Surgical History  Procedure Laterality Date  . Tonsillectomy and adenoidectomy    . Wisdom tooth extraction    . Laparoscopic assisted vaginal hysterectomy  05/26/2012    Procedure: LAPAROSCOPIC ASSISTED VAGINAL HYSTERECTOMY;  Surgeon: Gus Height, MD;  Location: [place] [facility];  Service: Gynecology;  Laterality: N/A;  . Bladder suspension  05/26/2012    Procedure: TRANSVAGINAL TAPE (TVT) PROCEDURE;  Surgeon: Daria Pastures, MD;  Location: [place] [facility];  Service: Gynecology;  Laterality: N/A;  . Cystoscopy  05/26/2012    Procedure:  CYSTOSCOPY;  Surgeon: Daria Pastures, MD;  Location: [facility] [facility];  Service: Gynecology;  Laterality: N/A;  . Cystocele repair  05/26/2012    Procedure: ANTERIOR REPAIR (CYSTOCELE);  Surgeon: Daria Pastures, MD;  Location: [facility] [facility];  Service: Gynecology;  Laterality: N/A;  . Salpingoophorectomy  05/26/2012    Procedure: SALPINGO OOPHERECTOMY;  Surgeon: Gus Height, MD;  Location: [facility];  Service: Gynecology;  Laterality: Left;    Current Outpatient Prescriptions  Medication Sig Dispense Refill  . atorvastatin (LIPITOR) 40 MG tablet TAKE 1 TABLET BY MOUTH EVERY DAY 30 tablet 2  . DULERA 100-5 MCG/ACT AERO INHALE 2 PUFFS INTO THE LUNGS TWICE A DAY 13 Inhaler 3  . mometasone (NASONEX) 50 MCG/ACT nasal spray Place 2 sprays into the nose daily. 17 g 4  . montelukast (SINGULAIR) 10 MG tablet Take 1 tablet (10 mg total) by mouth at bedtime. 30 tablet 3  . Probiotic Product (PROBIOTIC PO) Take by mouth daily.    Penne Lash HFA 45 MCG/ACT inhaler INHALE 2 PUFFS INTO LUNGS EVERY 6 HOURS AS NEEDED FOR WHEEZING AND SHORTNESS OF BREATH 15 Inhaler 5   No current facility-administered medications for this visit.    Family History  Problem Relation Age of Onset  . Heart disease Mother   . Heart attack Mother 26  . Diabetes Mother   .  Hypertension Mother   . Diabetes Other   . Hypertension Other   . Hypertension Maternal Grandfather   . Heart disease Maternal Grandfather     Review of Systems  Constitutional: Negative.   HENT: Negative.   Eyes: Negative.   Respiratory: Negative.   Cardiovascular: Negative.   Gastrointestinal: Negative.   Endocrine: Negative.   Genitourinary: Negative.   Musculoskeletal: Negative.   Skin: Negative.   Allergic/Immunologic: Negative.   Neurological: Negative.   Psychiatric/Behavioral: Negative.     Exam:   BP 120/80 mmHg  Pulse 76  Resp 14  Ht 5' 8.25" (1.734 m)  Wt 216 lb (97.977 kg)  BMI 32.59 kg/m2  LMP 04/16/2012  Weight change: @WEIGHTCHANGE @  Height:   Height: 5' 8.25" (173.4 cm)  Ht Readings from Last 3 Encounters:  09/26/15 5' 8.25" (1.734 m)  06/04/15 5\' 9"  (1.753 m)  02/04/15 5\' 9"  (1.753 m)    General appearance: alert, cooperative and appears stated age Head: Normocephalic, without obvious abnormality, atraumatic Neck: no adenopathy, supple, symmetrical, trachea midline and thyroid normal to inspection and palpation Lungs: clear to auscultation bilaterally Breasts: normal appearance, no masses or tenderness Heart: regular rate and rhythm Abdomen: soft, non-tender; bowel sounds normal; no masses,  no organomegaly Extremities: extremities normal, atraumatic, no cyanosis or edema Skin: Skin color, texture, turgor normal. No rashes or lesions Lymph nodes: Cervical, supraclavicular, and axillary nodes normal. No abnormal inguinal nodes palpated Neurologic: Grossly normal   Pelvic: External genitalia:  no lesions              Urethra:  normal appearing urethra with no masses, tenderness or lesions              Bartholins and Skenes: normal                 Vagina: normal appearing vagina with normal color and discharge, no lesions              Cervix: absent               Bimanual Exam:  Uterus:  uterus absent              Adnexa: no mass, fullness, tenderness               Rectovaginal: Confirms               Anus:  normal sphincter tone, no lesions  Chaperone was present for exam.  A:  Well Woman with normal exam  P:   No pap this year (consider checking in 2018)  Mammogram next year  Labs and immunization with her primary  Discussed breast self exam  Discussed calcium and vit D

## 2015-09-26 NOTE — Patient Instructions (Signed)

## 2015-10-05 ENCOUNTER — Other Ambulatory Visit: Payer: Self-pay | Admitting: Internal Medicine

## 2015-10-30 ENCOUNTER — Ambulatory Visit (INDEPENDENT_AMBULATORY_CARE_PROVIDER_SITE_OTHER): Payer: BC Managed Care – PPO | Admitting: Internal Medicine

## 2015-10-30 VITALS — BP 116/76 | HR 76 | Temp 98.2°F | Ht 69.0 in | Wt 215.0 lb

## 2015-10-30 DIAGNOSIS — J309 Allergic rhinitis, unspecified: Secondary | ICD-10-CM | POA: Diagnosis not present

## 2015-10-30 DIAGNOSIS — J019 Acute sinusitis, unspecified: Secondary | ICD-10-CM

## 2015-10-30 DIAGNOSIS — J4520 Mild intermittent asthma, uncomplicated: Secondary | ICD-10-CM

## 2015-10-30 DIAGNOSIS — J069 Acute upper respiratory infection, unspecified: Secondary | ICD-10-CM | POA: Insufficient documentation

## 2015-10-30 MED ORDER — HYDROCODONE-HOMATROPINE 5-1.5 MG/5ML PO SYRP: 5.0000 mL | ORAL_SOLUTION | Freq: Four times a day (QID) | ORAL | Status: DC | PRN

## 2015-10-30 MED ORDER — LEVOFLOXACIN 500 MG PO TABS: 500.0000 mg | ORAL_TABLET | Freq: Every day | ORAL | Status: DC

## 2015-10-30 NOTE — Patient Instructions (Signed)
Please take all new medication as prescribed - the antibiotic, and cough medicine as needed  Please continue all other medications as before, and refills have been done if requested.  Please have the pharmacy call with any other refills you may need.  Please keep your appointments with your specialists as you may have planned   

## 2015-10-30 NOTE — Assessment & Plan Note (Signed)
stable overall by history and exam, recent data reviewed with pt, and pt to continue medical treatment as before,  to f/u any worsening symptoms or concerns SpO2 Readings from Last 3 Encounters:  10/30/15 98%  06/04/15 98%  02/04/15 97%

## 2015-10-30 NOTE — Progress Notes (Signed)
Pre visit review using our clinic review tool, if applicable. No additional management support is needed unless otherwise documented below in the visit note. 

## 2015-10-30 NOTE — Progress Notes (Signed)
Subjective:    Patient ID: Robin Ayers, female    DOB: 01-09-1976, 39 y.o.   MRN: [id number]  HPI   Here with 2-3 days acute onset fever, facial pain, pressure, headache, general weakness and malaise, and greenish d/c, with mild ST and cough, but pt denies chest pain, wheezing, increased sob or doe, orthopnea, PND, increased LE swelling, palpitations, dizziness or syncope.  Pt denies new neurological symptoms such as new headache, or facial or extremity weakness or numbness  Pt denies polydipsia, polyuria,  Does have several wks ongoing nasal allergy symptoms with clearish congestion, itch and sneezing, without fever, pain, ST,  swelling or wheezing. Past Medical History  Diagnosis Date  . DYSLIPIDEMIA 06/24/2009  . ALLERGIC RHINITIS 10/26/2007  . ASTHMA 04/10/2010  . Factor V Leiden mutation ([place])     hx of  . Ovarian cyst   . Blood dyscrasia     factor V Leiden   . PONV (postoperative nausea and vomiting)   . Family history of anesthesia complication     PONV  . Clotting disorder [facility])    Past Surgical History  Procedure Laterality Date  . Tonsillectomy and adenoidectomy    . Wisdom tooth extraction    . Laparoscopic assisted vaginal hysterectomy  05/26/2012    Procedure: LAPAROSCOPIC ASSISTED VAGINAL HYSTERECTOMY;  Surgeon: Gus Height, MD;  Location: [place] [facility];  Service: Gynecology;  Laterality: N/A;  . Bladder suspension  05/26/2012    Procedure: TRANSVAGINAL TAPE (TVT) PROCEDURE;  Surgeon: Daria Pastures, MD;  Location: [place] [facility];  Service: Gynecology;  Laterality: N/A;  . Cystoscopy  05/26/2012    Procedure: CYSTOSCOPY;  Surgeon: Daria Pastures, MD;  Location: [place] [facility];  Service: Gynecology;  Laterality: N/A;  . Cystocele repair  05/26/2012    Procedure: ANTERIOR REPAIR (CYSTOCELE);  Surgeon: Daria Pastures, MD;  Location: [place] [facility];  Service: Gynecology;  Laterality: N/A;  . Salpingoophorectomy  05/26/2012    Procedure: SALPINGO OOPHERECTOMY;  Surgeon: Gus Height, MD;   Location: [facility] [facility];  Service: Gynecology;  Laterality: Left;    reports that she has quit smoking. She has never used smokeless tobacco. She reports that she does not drink alcohol or use illicit drugs. family history includes Diabetes in her mother and other; Heart attack (age of onset: 91) in her mother; Heart disease in her maternal grandfather and mother; Hypertension in her maternal grandfather, mother, and other. Allergies  Allergen Reactions  . Clarithromycin     REACTION: GI upset  . Penicillins     REACTION: rash   Current Outpatient Prescriptions on File Prior to Visit  Medication Sig Dispense Refill  . atorvastatin (LIPITOR) 40 MG tablet TAKE 1 TABLET BY MOUTH EVERY DAY 90 tablet 2  . DULERA 100-5 MCG/ACT AERO INHALE 2 PUFFS INTO THE LUNGS TWICE A DAY 13 Inhaler 3  . mometasone (NASONEX) 50 MCG/ACT nasal spray Place 2 sprays into the nose daily. 17 g 4  . montelukast (SINGULAIR) 10 MG tablet TAKE 1 TABLET (10 MG TOTAL) BY MOUTH AT BEDTIME. 90 tablet 2  . Probiotic Product (PROBIOTIC PO) Take by mouth daily.    Penne Lash HFA 45 MCG/ACT inhaler INHALE 2 PUFFS INTO LUNGS EVERY 6 HOURS AS NEEDED FOR WHEEZING AND SHORTNESS OF BREATH 15 Inhaler 5   No current facility-administered medications on file prior to visit.   Review of Systems   Constitutional: Negative for unusual diaphoresis or night sweats HENT: Negative for ringing in ear or discharge Eyes: Negative for  double vision or worsening visual disturbance.  Respiratory: Negative for choking and stridor.   Gastrointestinal: Negative for vomiting or other signifcant bowel change Genitourinary: Negative for hematuria or change in urine volume.  Musculoskeletal: Negative for other MSK pain or swelling Skin: Negative for color change and worsening wound.  Neurological: Negative for tremors and numbness other than noted  Psychiatric/Behavioral: Negative for decreased concentration or agitation other than above       Objective:     Physical Exam BP 116/76 mmHg  Pulse 76  Temp(Src) 98.2 F (36.8 C) (Oral)  Ht 5\' 9"  (1.753 m)  Wt 215 lb (97.523 kg)  BMI 31.74 kg/m2  SpO2 98%  LMP 04/16/2012 VS noted, mild ill appearing Constitutional: Pt appears in no significant distress HENT: Head: NCAT.  Right Ear: External ear normal.  Left Ear: External ear normal.  Bilat tm's with mild erythema.  Max sinus areas mild tender.  Pharynx with mild erythema, no exudate Eyes: . Pupils are equal, round, and reactive to light. Conjunctivae and EOM are normal Neck: Normal range of motion. Neck supple.  Cardiovascular: Normal rate and regular rhythm.   Pulmonary/Chest: Effort normal and breath sounds without rales or wheezing.  Neurological: Pt is alert. Not confused , motor grossly intact Skin: Skin is warm. No rash, no LE edema Psychiatric: Pt behavior is normal. No agitation.     Assessment & Plan:

## 2015-10-30 NOTE — Assessment & Plan Note (Signed)
Mild to mod, for antibx course,  to f/u any worsening symptoms or concerns 

## 2015-10-30 NOTE — Assessment & Plan Note (Signed)
To restart nasonex, singulair asd ,  to f/u any worsening symptoms or concerns

## 2015-11-19 ENCOUNTER — Ambulatory Visit (INDEPENDENT_AMBULATORY_CARE_PROVIDER_SITE_OTHER): Payer: BC Managed Care – PPO | Admitting: Family

## 2015-11-19 ENCOUNTER — Encounter: Payer: Self-pay | Admitting: Family

## 2015-11-19 VITALS — BP 110/84 | HR 72 | Temp 97.7°F | Resp 16 | Ht 68.25 in | Wt 215.0 lb

## 2015-11-19 DIAGNOSIS — J019 Acute sinusitis, unspecified: Secondary | ICD-10-CM

## 2015-11-19 MED ORDER — METHYLPREDNISOLONE ACETATE 80 MG/ML IJ SUSP
80.0000 mg | Freq: Once | INTRAMUSCULAR | Status: AC
  Administered 2015-11-19: 80 mg via INTRAMUSCULAR

## 2015-11-19 MED ORDER — DOXYCYCLINE HYCLATE 100 MG PO TABS: 100.0000 mg | ORAL_TABLET | Freq: Two times a day (BID) | ORAL | Status: DC

## 2015-11-19 NOTE — Progress Notes (Signed)
Subjective:    Patient ID: Robin Ayers, female    DOB: [birth year], 39 y.o.   MRN: [id number]  Chief Complaint  Patient presents with  . Nasal Congestion    was seen for the same sxs last visit and was put on abx sxs never fully went away and have gotten worse and worse    HPI:  Robin Ayers is a 39 y.o. female who  has a past medical history of DYSLIPIDEMIA (06/24/2009); ALLERGIC RHINITIS (10/26/2007); ASTHMA (04/10/2010); Factor V Leiden mutation ([place]); Ovarian cyst; Blood dyscrasia; PONV (postoperative nausea and vomiting); Family history of anesthesia complication; and Clotting disorder ([place]). and presents today for acute office visit.   Recently seen for a sinus infection on 11/23 and was treated with levofloxacin and Hydocan. Completed the antibiotic course as prescribed and felt as though the symptoms improved but were never fully resolved. Continues to experience the associated symptom of cough, sneezing, sore throat, congestion, headache, and sinus pressure. Course of the symptoms are worsening since. Modifying factors include over the counter medications which has not helped very much. Denies fevers.   Allergies  Allergen Reactions  . Clarithromycin     REACTION: GI upset  . Penicillins     REACTION: rash     Current Outpatient Prescriptions on File Prior to Visit  Medication Sig Dispense Refill  . atorvastatin (LIPITOR) 40 MG tablet TAKE 1 TABLET BY MOUTH EVERY DAY 90 tablet 2  . DULERA 100-5 MCG/ACT AERO INHALE 2 PUFFS INTO THE LUNGS TWICE A DAY 13 Inhaler 3  . HYDROcodone-homatropine (HYCODAN) 5-1.5 MG/5ML syrup Take 5 mLs by mouth every 6 (six) hours as needed for cough. 180 mL 0  . mometasone (NASONEX) 50 MCG/ACT nasal spray Place 2 sprays into the nose daily. 17 g 4  . montelukast (SINGULAIR) 10 MG tablet TAKE 1 TABLET (10 MG TOTAL) BY MOUTH AT BEDTIME. 90 tablet 2  . Probiotic Product (PROBIOTIC PO) Take by mouth daily.    Penne Lash HFA 45 MCG/ACT  inhaler INHALE 2 PUFFS INTO LUNGS EVERY 6 HOURS AS NEEDED FOR WHEEZING AND SHORTNESS OF BREATH 15 Inhaler 5   No current facility-administered medications on file prior to visit.     Review of Systems  Constitutional: Negative for fever and chills.  HENT: Positive for congestion, sinus pressure, sneezing and sore throat.   Respiratory: Positive for cough. Negative for chest tightness and shortness of breath.   Neurological: Positive for headaches.      Objective:    BP 110/84 mmHg  Pulse 72  Temp(Src) 97.7 F (36.5 C) (Oral)  Resp 16  Ht 5' 8.25" (1.734 m)  Wt 215 lb (97.523 kg)  BMI 32.43 kg/m2  SpO2 98%  LMP 04/16/2012 Nursing note and vital signs reviewed.  Physical Exam  Constitutional: She is oriented to person, place, and time. She appears well-developed and well-nourished. No distress.  HENT:  Right Ear: Hearing, tympanic membrane, external ear and ear canal normal.  Left Ear: Hearing, tympanic membrane, external ear and ear canal normal.  Nose: Right sinus exhibits maxillary sinus tenderness and frontal sinus tenderness. Left sinus exhibits maxillary sinus tenderness and frontal sinus tenderness.  Mouth/Throat: Uvula is midline, oropharynx is clear and moist and mucous membranes are normal.  Neck: Neck supple.  Cardiovascular: Normal rate, regular rhythm, normal heart sounds and intact distal pulses.   Pulmonary/Chest: Effort normal and breath sounds normal.  Neurological: She is alert and oriented to person, place, and time.  Skin: Skin is  warm and dry.  Psychiatric: She has a normal mood and affect. Her behavior is normal. Judgment and thought content normal.       Assessment & Plan:   Problem List Items Addressed This Visit      Respiratory   Acute sinus infection - Primary    Symptoms and exam consistent with acute bacterial sinusitis refractory to previous level floxacillin treatment. Start doxycycline. In office injection Depo-Medrol provided. Continue  over-the-counter medications as needed for symptom relief and supportive care. Follow-up if symptoms worsen or fail to improve.      Relevant Medications   doxycycline (VIBRA-TABS) 100 MG tablet   methylPREDNISolone acetate (DEPO-MEDROL) injection 80 mg (Completed)

## 2015-11-19 NOTE — Patient Instructions (Signed)
Thank you for choosing [facility].  Summary/Instructions:  Your prescription(s) have been submitted to your pharmacy or been printed and provided for you. Please take as directed and contact our office if you believe you are having problem(s) with the medication(s) or have any questions.  If your symptoms worsen or fail to improve, please contact our office for further instruction, or in case of emergency go directly to the emergency room at the closest medical facility.   General Recommendations:    Please drink plenty of fluids.  Get plenty of rest   Sleep in humidified air  Use saline nasal sprays  Netti pot   OTC Medications:  Decongestants - helps relieve congestion   Flonase (generic fluticasone) or Nasacort (generic triamcinolone) - please make sure to use the "cross-over" technique at a 45 degree angle towards the opposite eye as opposed to straight up the nasal passageway.   Sudafed (generic pseudoephedrine - Note this is the one that is available behind the pharmacy counter); Products with phenylephrine (-PE) may also be used but is often not as effective as pseudoephedrine.   If you have HIGH BLOOD PRESSURE - Coricidin HBP; AVOID any product that is -D as this contains pseudoephedrine which may increase your blood pressure.  Afrin (oxymetazoline) every 6-8 hours for up to 3 days.   Allergies - helps relieve runny nose, itchy eyes and sneezing   Claritin (generic loratidine), Allegra (fexofenidine), or Zyrtec (generic cyrterizine) for runny nose. These medications should not cause drowsiness.  Note - Benadryl (generic diphenhydramine) may be used however may cause drowsiness  Cough -   Delsym or Robitussin (generic dextromethorphan)  Expectorants - helps loosen mucus to ease removal   Mucinex (generic guaifenesin) as directed on the package.  Headaches / General Aches   Tylenol (generic acetaminophen) - DO NOT EXCEED 3 grams (3,000 mg) in a 24  hour time period  Advil/Motrin (generic ibuprofen)   Sore Throat -   Salt water gargle   Chloraseptic (generic benzocaine) spray or lozenges / Sucrets (generic dyclonine)    Sinusitis Sinusitis is redness, soreness, and inflammation of the paranasal sinuses. Paranasal sinuses are air pockets within the bones of your face (beneath the eyes, the middle of the forehead, or above the eyes). In healthy paranasal sinuses, mucus is able to drain out, and air is able to circulate through them by way of your nose. However, when your paranasal sinuses are inflamed, mucus and air can become trapped. This can allow bacteria and other germs to grow and cause infection. Sinusitis can develop quickly and last only a short time (acute) or continue over a long period (chronic). Sinusitis that lasts for more than 12 weeks is considered chronic.  CAUSES  Causes of sinusitis include:  Allergies.  Structural abnormalities, such as displacement of the cartilage that separates your nostrils (deviated septum), which can decrease the air flow through your nose and sinuses and affect sinus drainage.  Functional abnormalities, such as when the small hairs (cilia) that line your sinuses and help remove mucus do not work properly or are not present. SIGNS AND SYMPTOMS  Symptoms of acute and chronic sinusitis are the same. The primary symptoms are pain and pressure around the affected sinuses. Other symptoms include:  Upper toothache.  Earache.  Headache.  Bad breath.  Decreased sense of smell and taste.  A cough, which worsens when you are lying flat.  Fatigue.  Fever.  Thick drainage from your nose, which often is green and may   contain pus (purulent).  Swelling and warmth over the affected sinuses. DIAGNOSIS  Your health care provider will perform a physical exam. During the exam, your health care provider may:  Look in your nose for signs of abnormal growths in your nostrils (nasal  polyps).  Tap over the affected sinus to check for signs of infection.  View the inside of your sinuses (endoscopy) using an imaging device that has a light attached (endoscope). If your health care provider suspects that you have chronic sinusitis, one or more of the following tests may be recommended:  Allergy tests.  Nasal culture. A sample of mucus is taken from your nose, sent to a lab, and screened for bacteria.  Nasal cytology. A sample of mucus is taken from your nose and examined by your health care provider to determine if your sinusitis is related to an allergy. TREATMENT  Most cases of acute sinusitis are related to a viral infection and will resolve on their own within 10 days. Sometimes medicines are prescribed to help relieve symptoms (pain medicine, decongestants, nasal steroid sprays, or saline sprays).  However, for sinusitis related to a bacterial infection, your health care provider will prescribe antibiotic medicines. These are medicines that will help kill the bacteria causing the infection.  Rarely, sinusitis is caused by a fungal infection. In theses cases, your health care provider will prescribe antifungal medicine. For some cases of chronic sinusitis, surgery is needed. Generally, these are cases in which sinusitis recurs more than 3 times per year, despite other treatments. HOME CARE INSTRUCTIONS   Drink plenty of water. Water helps thin the mucus so your sinuses can drain more easily.  Use a humidifier.  Inhale steam 3 to 4 times a day (for example, sit in the bathroom with the shower running).  Apply a warm, moist washcloth to your face 3 to 4 times a day, or as directed by your health care provider.  Use saline nasal sprays to help moisten and clean your sinuses.  Take medicines only as directed by your health care provider.  If you were prescribed either an antibiotic or antifungal medicine, finish it all even if you start to feel better. SEEK IMMEDIATE  MEDICAL CARE IF:  You have increasing pain or severe headaches.  You have nausea, vomiting, or drowsiness.  You have swelling around your face.  You have vision problems.  You have a stiff neck.  You have difficulty breathing. MAKE SURE YOU:   Understand these instructions.  Will watch your condition.  Will get help right away if you are not doing well or get worse. Document Released: 11/23/2005 Document Revised: 04/09/2014 Document Reviewed: 12/08/2011 Exit[employer] Patient Information 2015 [employer]. This information is not intended to replace advice given to you by your health care provider. Make sure you discuss any questions you have with your health care provider.   

## 2015-11-19 NOTE — Progress Notes (Signed)
Pre visit review using our clinic review tool, if applicable. No additional management support is needed unless otherwise documented below in the visit note. 

## 2015-11-19 NOTE — Assessment & Plan Note (Signed)
Symptoms and exam consistent with acute bacterial sinusitis refractory to previous level floxacillin treatment. Start doxycycline. In office injection Depo-Medrol provided. Continue over-the-counter medications as needed for symptom relief and supportive care. Follow-up if symptoms worsen or fail to improve.

## 2016-01-04 ENCOUNTER — Other Ambulatory Visit: Payer: Self-pay | Admitting: Family

## 2016-02-18 ENCOUNTER — Encounter: Payer: Self-pay | Admitting: Family Medicine

## 2016-02-18 ENCOUNTER — Ambulatory Visit (INDEPENDENT_AMBULATORY_CARE_PROVIDER_SITE_OTHER): Payer: BC Managed Care – PPO | Admitting: Family Medicine

## 2016-02-18 VITALS — BP 112/78 | HR 91 | Temp 98.3°F | Ht 68.25 in | Wt 208.9 lb

## 2016-02-18 DIAGNOSIS — J329 Chronic sinusitis, unspecified: Secondary | ICD-10-CM

## 2016-02-18 MED ORDER — DOXYCYCLINE HYCLATE 100 MG PO TABS: 100.0000 mg | ORAL_TABLET | Freq: Two times a day (BID) | ORAL | Status: DC

## 2016-02-18 NOTE — Progress Notes (Signed)
Pre visit review using our clinic review tool, if applicable. No additional management support is needed unless otherwise documented below in the visit note. 

## 2016-02-18 NOTE — Patient Instructions (Signed)
Please see use nasal saline up to 3 times daily.  These get Afrin nasal spray and used twice daily for 5 days. Do not use longer than 5 days.  Continue your allergy and asthma medications.  His symptoms are worsening or are not improving over the next several days, please take the antibiotic as instructed.  Please seek care if symptoms are worsening, new symptoms arise or ear symptoms persist despite treatment.

## 2016-02-18 NOTE — Progress Notes (Signed)
HPI:  Robin Ayers is a pleasant 40 year old here for an acute visit for sinus congestion: -started: 1 week ago -symptoms:nasal congestion - clear to yellow, sore throat, mild cough, sinus pressure and pain, right ear pain -denies:fever, SOB, NVD, tooth pain, wheezing or asthma symptoms, albuterol use -has tried: Her regular allergy medications -sick contacts/travel/risks: no reported flu, strep or tick exposure -Hx of: allergies asthma, hx partial hysterectomy ROS: See pertinent positives and negatives per HPI.  Past Medical History  Diagnosis Date  . DYSLIPIDEMIA 06/24/2009  . ALLERGIC RHINITIS 10/26/2007  . ASTHMA 04/10/2010  . Factor V Leiden mutation ([place])     hx of  . Ovarian cyst   . Blood dyscrasia     factor V Leiden   . PONV (postoperative nausea and vomiting)   . Family history of anesthesia complication     PONV  . Clotting disorder [facility])     Past Surgical History  Procedure Laterality Date  . Tonsillectomy and adenoidectomy    . Wisdom tooth extraction    . Laparoscopic assisted vaginal hysterectomy  05/26/2012    Procedure: LAPAROSCOPIC ASSISTED VAGINAL HYSTERECTOMY;  Surgeon: Gus Height, MD;  Location: [facility];  Service: Gynecology;  Laterality: N/A;  . Bladder suspension  05/26/2012    Procedure: TRANSVAGINAL TAPE (TVT) PROCEDURE;  Surgeon: Daria Pastures, MD;  Location: [facility];  Service: Gynecology;  Laterality: N/A;  . Cystoscopy  05/26/2012    Procedure: CYSTOSCOPY;  Surgeon: Daria Pastures, MD;  Location: [facility];  Service: Gynecology;  Laterality: N/A;  . Cystocele repair  05/26/2012    Procedure: ANTERIOR REPAIR (CYSTOCELE);  Surgeon: Daria Pastures, MD;  Location: [facility];  Service: Gynecology;  Laterality: N/A;  . Salpingoophorectomy  05/26/2012    Procedure: SALPINGO OOPHERECTOMY;  Surgeon: Gus Height, MD;  Location: [facility];  Service: Gynecology;  Laterality: Left;    Family History  Problem Relation Age of Onset  . Heart disease  Mother   . Heart attack Mother 88  . Diabetes Mother   . Hypertension Mother   . Diabetes Other   . Hypertension Other   . Hypertension Maternal Grandfather   . Heart disease Maternal Grandfather     Social History   Social History  . Marital Status: Married    Spouse Name: N/A  . Number of Children: N/A  . Years of Education: N/A   Occupational History  . Teacher [employer]   Social History Main Topics  . Smoking status: Former Research scientist (life sciences)  . Smokeless tobacco: Never Used  . Alcohol Use: No     Comment: e  . Drug Use: No  . Sexual Activity:    Partners: Male    Birth Control/ Protection: Surgical     Comment: LAVH 05/2012   Other Topics Concern  . None   Social History Narrative   Regular exercise-yes   Husband has had a vasectomy     Current outpatient prescriptions:  .  atorvastatin (LIPITOR) 40 MG tablet, TAKE 1 TABLET BY MOUTH EVERY DAY, Disp: 90 tablet, Rfl: 2 .  DULERA 100-5 MCG/ACT AERO, INHALE 2 PUFFS INTO THE LUNGS TWICE A DAY, Disp: 13 Inhaler, Rfl: 3 .  montelukast (SINGULAIR) 10 MG tablet, TAKE 1 TABLET (10 MG TOTAL) BY MOUTH AT BEDTIME., Disp: 90 tablet, Rfl: 2 .  Probiotic Product (PROBIOTIC PO), Take by mouth daily., Disp: , Rfl:  .  doxycycline (VIBRA-TABS) 100 MG tablet, Take 1 tablet (100 mg total) by mouth 2 (  two) times daily., Disp: 20 tablet, Rfl: 0  EXAM:  Filed Vitals:   02/18/16 1257  BP: 112/78  Pulse: 91  Temp: 98.3 F (36.8 C)    Body mass index is 31.51 kg/(m^2).  GENERAL: vitals reviewed and listed above, alert, oriented, appears well hydrated and in no acute distress  HEENT: atraumatic, conjunttiva clear, no obvious abnormalities on inspection of external nose and ears, normal appearance of ear canals and TMs, clear nasal congestion, mild post oropharyngeal erythema with PND, no tonsillar edema or exudate, no sinus TTP  NECK: no obvious masses on inspection  LUNGS: clear to auscultation bilaterally, no wheezes,  rales or rhonchi, good air movement, O2 sats normal on RA, no signs of respiratory distress  CV: HRRR, no peripheral edema  MS: moves all extremities without noticeable abnormality  PSYCH: pleasant and cooperative, no obvious depression or anxiety  ASSESSMENT AND PLAN:  Discussed the following assessment and plan:  Rhinosinusitis  -given HPI and exam findings today, a serious infection or illness is unlikely. We discussed potential etiologies, with VURI being most likely with a possible developing  sinus infection. We discussed treatment side effects, likely course, antibiotic misuse, transmission, and signs of developing a serious illness. Opted to treat with a nasal decongestant, nasal saline and antibiotic if sinus symptoms are not responding to these treatments. She has no signs or symptoms to suggest a lung infection or an asthma exacerberation today.  -of course, we advised to return or notify a doctor immediately if symptoms worsen or persist or new concerns arise.    Patient Instructions  Please see use nasal saline up to 3 times daily.  These get Afrin nasal spray and used twice daily for 5 days. Do not use longer than 5 days.  Continue your allergy and asthma medications.  His symptoms are worsening or are not improving over the next several days, please take the antibiotic as instructed.  Please seek care if symptoms are worsening, new symptoms arise or ear symptoms persist despite treatment.     Colin Benton R.

## 2016-05-27 ENCOUNTER — Encounter: Payer: Self-pay | Admitting: Internal Medicine

## 2016-05-27 ENCOUNTER — Ambulatory Visit (INDEPENDENT_AMBULATORY_CARE_PROVIDER_SITE_OTHER): Payer: BC Managed Care – PPO | Admitting: Internal Medicine

## 2016-05-27 VITALS — BP 122/82 | HR 87 | Temp 98.6°F | Resp 20 | Wt 218.0 lb

## 2016-05-27 DIAGNOSIS — J309 Allergic rhinitis, unspecified: Secondary | ICD-10-CM | POA: Diagnosis not present

## 2016-05-27 DIAGNOSIS — J4520 Mild intermittent asthma, uncomplicated: Secondary | ICD-10-CM | POA: Diagnosis not present

## 2016-05-27 DIAGNOSIS — E785 Hyperlipidemia, unspecified: Secondary | ICD-10-CM | POA: Diagnosis not present

## 2016-05-27 DIAGNOSIS — Z0001 Encounter for general adult medical examination with abnormal findings: Secondary | ICD-10-CM

## 2016-05-27 DIAGNOSIS — J069 Acute upper respiratory infection, unspecified: Secondary | ICD-10-CM

## 2016-05-27 DIAGNOSIS — R6889 Other general symptoms and signs: Secondary | ICD-10-CM

## 2016-05-27 DIAGNOSIS — R21 Rash and other nonspecific skin eruption: Secondary | ICD-10-CM

## 2016-05-27 MED ORDER — PREDNISONE 10 MG PO TABS: ORAL_TABLET | ORAL | Status: DC

## 2016-05-27 MED ORDER — MOMETASONE FURO-FORMOTEROL FUM 100-5 MCG/ACT IN AERO: INHALATION_SPRAY | RESPIRATORY_TRACT | Status: DC

## 2016-05-27 MED ORDER — ATORVASTATIN CALCIUM 40 MG PO TABS: 40.0000 mg | ORAL_TABLET | Freq: Every day | ORAL | Status: DC

## 2016-05-27 MED ORDER — MONTELUKAST SODIUM 10 MG PO TABS: ORAL_TABLET | ORAL | Status: DC

## 2016-05-27 MED ORDER — TRIAMCINOLONE ACETONIDE 0.1 % EX CREA: 1.0000 "application " | TOPICAL_CREAM | Freq: Two times a day (BID) | CUTANEOUS | Status: DC

## 2016-05-27 NOTE — Assessment & Plan Note (Signed)

## 2016-05-27 NOTE — Assessment & Plan Note (Signed)
stable overall by history and exam, recent data reviewed with pt, and pt to continue medical treatment as before,  to f/u any worsening symptoms or concerns SpO2 Readings from Last 3 Encounters:  05/27/16 98%  02/18/16 97%  11/19/15 98%

## 2016-05-27 NOTE — Patient Instructions (Signed)
.  You had the steroid shot today   Please take all new medication as prescribed - the prednisone, as well as the topical steroid if needed  Please continue all other medications as before, and refills have been done if requested.  Please have the pharmacy call with any other refills you may need.  Please continue your efforts at being more active, low cholesterol diet, and weight control.  You are otherwise up to date with prevention measures today.  Please keep your appointments with your specialists as you may have planned  Please go to the LAB in the Basement (turn left off the elevator) for the tests to be done today  You will be contacted by phone if any changes need to be made immediately.  Otherwise, you will receive a letter about your results with an explanation, but please check with MyChart first.  Please remember to sign up for MyChart if you have not done so, as this will be important to you in the future with finding out test results, communicating by private email, and scheduling acute appointments online when needed.  Please return in 1 year for your yearly visit, or sooner if needed, with Lab testing done 3-5 days before

## 2016-05-27 NOTE — Progress Notes (Signed)
Subjective:    Patient ID: Robin Ayers, female    DOB: [birth year], 40 y.o.   MRN: [id number]  HPI  Here for wellness and f/u;  Overall doing ok;  Pt denies Chest pain, worsening SOB, DOE, wheezing, orthopnea, PND, worsening LE edema, palpitations, dizziness or syncope.  Pt denies neurological change such as new headache, facial or extremity weakness.  Pt denies polydipsia, polyuria, or low sugar symptoms. Pt states overall good compliance with treatment and medications, good tolerability, and has been trying to follow appropriate diet.  Pt denies worsening depressive symptoms, suicidal ideation or panic. No fever, night sweats, wt loss, loss of appetite, or other constitutional symptoms.  Pt states good ability with ADL's, has low fall risk, home safety reviewed and adequate, no other significant changes in hearing or vision, and only occasionally active with exercise.    Does have significant rash to bilat inner thighs, knees and somewhat below the knees, several spots to distal LUE, x 2 wks persistent , just not better with current meds - zyrtec and singulair, as well otc cortisone, actually seems to be spreading.  No other swelling or dicomfort such as lips or tongue.     Here with 2-3 days acute onset ? feverish, headache, mild general weakness and malaise, and clear d/c, with slight ST and non prod cough.  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 Past Medical History  Diagnosis Date  . DYSLIPIDEMIA 06/24/2009  . ALLERGIC RHINITIS 10/26/2007  . ASTHMA 04/10/2010  . Factor V Leiden mutation ([place])     hx of  . Ovarian cyst   . Blood dyscrasia     factor V Leiden   . PONV (postoperative nausea and vomiting)   . Family history of anesthesia complication     PONV  . Clotting disorder [facility])    Past Surgical History  Procedure Laterality Date  . Tonsillectomy and adenoidectomy    . Wisdom tooth extraction    . Laparoscopic assisted vaginal  hysterectomy  05/26/2012    Procedure: LAPAROSCOPIC ASSISTED VAGINAL HYSTERECTOMY;  Surgeon: Gus Height, MD;  Location: [facility];  Service: Gynecology;  Laterality: N/A;  . Bladder suspension  05/26/2012    Procedure: TRANSVAGINAL TAPE (TVT) PROCEDURE;  Surgeon: Daria Pastures, MD;  Location: [facility];  Service: Gynecology;  Laterality: N/A;  . Cystoscopy  05/26/2012    Procedure: CYSTOSCOPY;  Surgeon: Daria Pastures, MD;  Location: [facility];  Service: Gynecology;  Laterality: N/A;  . Cystocele repair  05/26/2012    Procedure: ANTERIOR REPAIR (CYSTOCELE);  Surgeon: Daria Pastures, MD;  Location: [facility];  Service: Gynecology;  Laterality: N/A;  . Salpingoophorectomy  05/26/2012    Procedure: SALPINGO OOPHERECTOMY;  Surgeon: Gus Height, MD;  Location: [facility];  Service: Gynecology;  Laterality: Left;    reports that she has quit smoking. She has never used smokeless tobacco. She reports that she does not drink alcohol or use illicit drugs. family history includes Diabetes in her mother and other; Heart attack (age of onset: 16) in her mother; Heart disease in her maternal grandfather and mother; Hypertension in her maternal grandfather, mother, and other. Allergies  Allergen Reactions  . Clarithromycin     REACTION: GI upset  . Penicillins     REACTION: rash   Current Outpatient Prescriptions on File Prior to Visit  Medication Sig Dispense Refill  . Probiotic Product (PROBIOTIC PO) Take by mouth daily.  No current facility-administered medications on file prior to visit.   Review of Systems Constitutional: Negative for increased diaphoresis, or other activity, appetite or siginficant weight change other than noted HENT: Negative for worsening hearing loss, ear pain, facial swelling, mouth sores and neck stiffness.   Eyes: Negative for other worsening pain, redness or visual disturbance.  Respiratory: Negative for choking or stridor Cardiovascular: Negative for other chest pain and  palpitations.  Gastrointestinal: Negative for worsening diarrhea, blood in stool, or abdominal distention Genitourinary: Negative for hematuria, flank pain or change in urine volume.  Musculoskeletal: Negative for myalgias or other joint complaints.  Skin: Negative for other color change and wound or drainage.  Neurological: Negative for syncope and numbness. other than noted Hematological: Negative for adenopathy. or other swelling Psychiatric/Behavioral: Negative for hallucinations, SI, self-injury, decreased concentration or other worsening agitation.      Objective:   Physical Exam BP 122/82 mmHg  Pulse 87  Temp(Src) 98.6 F (37 C) (Oral)  Resp 20  Wt 218 lb (98.884 kg)  SpO2 98%  LMP 04/16/2012 VS noted,  Constitutional: Pt is oriented to person, place, and time. Appears well-developed and well-nourished, in no significant distress Head: Normocephalic and atraumatic  Eyes: Conjunctivae and EOM are normal. Pupils are equal, round, and reactive to light Right Ear: External ear normal.  Left Ear: External ear normal Nose: Nose normal.  Mouth/Throat: Oropharynx is clear and moist  Bilat tm's with mild erythema.  Max sinus areas non tender.  Pharynx with mild erythema, no exudate Neck: Normal range of motion. Neck supple. No JVD present. No tracheal deviation present or significant neck LA or mass Cardiovascular: Normal rate, regular rhythm, normal heart sounds and intact distal pulses.   Pulmonary/Chest: Effort normal and breath sounds without rales or wheezing  Abdominal: Soft. Bowel sounds are normal. NT. No HSM  Musculoskeletal: Normal range of motion. Exhibits no edema Lymphadenopathy: Has no cervical adenopathy.  Neurological: Pt is alert and oriented to person, place, and time. Pt has normal reflexes. No cranial nerve deficit. Motor grossly intact Skin: Skin is warm and dry. No new ulcers, but has moderate mult erythem pruritic lesions slight raised  Macular papular to  biltat inner upper legs/knees and somewhat distal to knees and small post thigh as well Psychiatric:  Has normal mood and affect. Behavior is normal.      Assessment & Plan:

## 2016-05-27 NOTE — Progress Notes (Signed)
Pre visit review using our clinic review tool, if applicable. No additional management support is needed unless otherwise documented below in the visit note. 

## 2016-05-27 NOTE — Assessment & Plan Note (Signed)
C/w allergic type, ?contact vs other, doubt drug, for depomedrol IM, predpac asd, topical steroid if needed prn,  to f/u any worsening symptoms or concerns

## 2016-05-27 NOTE — Assessment & Plan Note (Signed)
Mod elev last yr with trial off lipitor, but restarted due to elev LDL,  Lab Results  Component Value Date   LDLCALC 153* 06/04/2015   stable overall by history and exam, recent data reviewed with pt, and pt to continue medical treatment as before,  to f/u any worsening symptoms or concerns

## 2016-05-27 NOTE — Assessment & Plan Note (Signed)
Stable, to cont current tx 

## 2016-05-27 NOTE — Assessment & Plan Note (Signed)
Mild, c/w viral illness, ok for mucinex otc prn,  to f/u any worsening symptoms or concerns

## 2016-06-03 ENCOUNTER — Encounter: Payer: BC Managed Care – PPO | Admitting: Internal Medicine

## 2016-07-23 ENCOUNTER — Other Ambulatory Visit (INDEPENDENT_AMBULATORY_CARE_PROVIDER_SITE_OTHER): Payer: BC Managed Care – PPO

## 2016-07-23 DIAGNOSIS — Z0001 Encounter for general adult medical examination with abnormal findings: Secondary | ICD-10-CM

## 2016-07-23 DIAGNOSIS — R6889 Other general symptoms and signs: Secondary | ICD-10-CM

## 2016-07-23 LAB — CBC WITH DIFFERENTIAL/PLATELET
BASOS ABS: 0 10*3/uL (ref 0.0–0.1)
Basophils Relative: 0.6 % (ref 0.0–3.0)
EOS ABS: 0.1 10*3/uL (ref 0.0–0.7)
Eosinophils Relative: 2.2 % (ref 0.0–5.0)
HCT: 40.5 % (ref 36.0–46.0)
Hemoglobin: 13.7 g/dL (ref 12.0–15.0)
LYMPHS ABS: 1.5 10*3/uL (ref 0.7–4.0)
Lymphocytes Relative: 24.7 % (ref 12.0–46.0)
MCHC: 33.9 g/dL (ref 30.0–36.0)
MCV: 87.1 fl (ref 78.0–100.0)
MONO ABS: 0.4 10*3/uL (ref 0.1–1.0)
Monocytes Relative: 7.2 % (ref 3.0–12.0)
NEUTROS PCT: 65.3 % (ref 43.0–77.0)
Neutro Abs: 4 10*3/uL (ref 1.4–7.7)
Platelets: 221 10*3/uL (ref 150.0–400.0)
RBC: 4.65 Mil/uL (ref 3.87–5.11)
RDW: 13.4 % (ref 11.5–15.5)
WBC: 6.1 10*3/uL (ref 4.0–10.5)

## 2016-07-23 LAB — LIPID PANEL
CHOLESTEROL: 164 mg/dL (ref 0–200)
HDL: 57.9 mg/dL (ref >39.00)
LDL CALC: 90 mg/dL (ref 0–99)
NonHDL: 105.84
TRIGLYCERIDES: 81 mg/dL (ref 0.0–149.0)
Total CHOL/HDL Ratio: 3
VLDL: 16.2 mg/dL (ref 0.0–40.0)

## 2016-07-23 LAB — TSH: TSH: 1.34 u[IU]/mL (ref 0.35–4.50)

## 2016-07-23 LAB — BASIC METABOLIC PANEL
BUN: 11 mg/dL (ref 6–23)
CHLORIDE: 105 meq/L (ref 96–112)
CO2: 28 meq/L (ref 19–32)
Calcium: 9 mg/dL (ref 8.4–10.5)
Creatinine, Ser: 0.82 mg/dL (ref 0.40–1.20)
GFR: 82.1 mL/min (ref >60.00)
GLUCOSE: 99 mg/dL (ref 70–99)
POTASSIUM: 4.4 meq/L (ref 3.5–5.1)
Sodium: 137 mEq/L (ref 135–145)

## 2016-07-23 LAB — URINALYSIS, ROUTINE W REFLEX MICROSCOPIC
BILIRUBIN URINE: NEGATIVE
KETONES UR: NEGATIVE
LEUKOCYTES UA: NEGATIVE
Nitrite: NEGATIVE
PH: 6.5 (ref 5.0–8.0)
Specific Gravity, Urine: 1.015 (ref 1.000–1.030)
TOTAL PROTEIN, URINE-UPE24: NEGATIVE
UROBILINOGEN UA: 0.2 (ref 0.0–1.0)
Urine Glucose: NEGATIVE
WBC, UA: NONE SEEN (ref 0–?)

## 2016-07-23 LAB — HEPATIC FUNCTION PANEL
ALBUMIN: 4 g/dL (ref 3.5–5.2)
ALK PHOS: 84 U/L (ref 39–117)
ALT: 13 U/L (ref 0–35)
AST: 11 U/L (ref 0–37)
Bilirubin, Direct: 0.1 mg/dL (ref 0.0–0.3)
TOTAL PROTEIN: 6.3 g/dL (ref 6.0–8.3)
Total Bilirubin: 0.5 mg/dL (ref 0.2–1.2)

## 2016-09-02 ENCOUNTER — Ambulatory Visit (INDEPENDENT_AMBULATORY_CARE_PROVIDER_SITE_OTHER): Payer: BC Managed Care – PPO | Admitting: Internal Medicine

## 2016-09-02 VITALS — BP 126/76 | HR 70 | Temp 98.2°F | Resp 20 | Wt 226.0 lb

## 2016-09-02 DIAGNOSIS — Z23 Encounter for immunization: Secondary | ICD-10-CM

## 2016-09-02 DIAGNOSIS — Z0001 Encounter for general adult medical examination with abnormal findings: Secondary | ICD-10-CM

## 2016-09-02 DIAGNOSIS — R6889 Other general symptoms and signs: Secondary | ICD-10-CM

## 2016-09-02 DIAGNOSIS — Z1239 Encounter for other screening for malignant neoplasm of breast: Secondary | ICD-10-CM

## 2016-09-02 DIAGNOSIS — Z0000 Encounter for general adult medical examination without abnormal findings: Secondary | ICD-10-CM

## 2016-09-02 NOTE — Assessment & Plan Note (Signed)

## 2016-09-02 NOTE — Patient Instructions (Addendum)
You had the flu shot today  You will be contacted regarding the referral for: mammogram  Please make follow up appt with your GYN  Please continue all other medications as before, and refills have been done if requested.  Please have the pharmacy call with any other refills you may need.  Please continue your efforts at being more active, low cholesterol diet, and weight control.  You are otherwise up to date with prevention measures today.  Please keep your appointments with your specialists as you may have planned  Please return in 1 year for your yearly visit, or sooner if needed, with Lab testing done 3-5 days before

## 2016-09-02 NOTE — Progress Notes (Signed)
Pre visit review using our clinic review tool, if applicable. No additional management support is needed unless otherwise documented below in the visit note. 

## 2016-09-02 NOTE — Progress Notes (Signed)
Subjective:    Patient ID: Robin Ayers, female    DOB: 07-02-1976, 40 y.o.   MRN: [id number]  HPI  Here for wellness and f/u;  Overall doing ok;  Pt denies Chest pain, worsening SOB, DOE, wheezing, orthopnea, PND, worsening LE edema, palpitations, dizziness or syncope.  Pt denies neurological change such as new headache, facial or extremity weakness.  Pt denies polydipsia, polyuria, or low sugar symptoms. Pt states overall good compliance with treatment and medications, good tolerability, and has been trying to follow appropriate diet.  Pt denies worsening depressive symptoms, suicidal ideation or panic. No fever, night sweats, wt loss, loss of appetite, or other constitutional symptoms.  Pt states good ability with ADL's, has low fall risk, home safety reviewed and adequate, no other significant changes in hearing or vision, and active with exercise with walking 1 mile per day  Plans to call GYN for pap smear.  Due for mammogram Past Medical History:  Diagnosis Date  . ALLERGIC RHINITIS 10/26/2007  . ASTHMA 04/10/2010  . Blood dyscrasia    factor V Leiden   . Clotting disorder ([place])   . DYSLIPIDEMIA 06/24/2009  . Factor V Leiden mutation ([place])    hx of  . Family history of anesthesia complication    PONV  . Ovarian cyst   . PONV (postoperative nausea and vomiting)    Past Surgical History:  Procedure Laterality Date  . BLADDER SUSPENSION  05/26/2012   Procedure: TRANSVAGINAL TAPE (TVT) PROCEDURE;  Surgeon: Daria Pastures, MD;  Location: [facility];  Service: Gynecology;  Laterality: N/A;  . CYSTOCELE REPAIR  05/26/2012   Procedure: ANTERIOR REPAIR (CYSTOCELE);  Surgeon: Daria Pastures, MD;  Location: [facility];  Service: Gynecology;  Laterality: N/A;  . CYSTOSCOPY  05/26/2012   Procedure: CYSTOSCOPY;  Surgeon: Daria Pastures, MD;  Location: [facility];  Service: Gynecology;  Laterality: N/A;  . LAPAROSCOPIC ASSISTED VAGINAL HYSTERECTOMY  05/26/2012   Procedure: LAPAROSCOPIC  ASSISTED VAGINAL HYSTERECTOMY;  Surgeon: Gus Height, MD;  Location: [facility];  Service: Gynecology;  Laterality: N/A;  . SALPINGOOPHORECTOMY  05/26/2012   Procedure: SALPINGO OOPHERECTOMY;  Surgeon: Gus Height, MD;  Location: [facility];  Service: Gynecology;  Laterality: Left;  . TONSILLECTOMY AND ADENOIDECTOMY    . WISDOM TOOTH EXTRACTION      reports that she has quit smoking. She has never used smokeless tobacco. She reports that she does not drink alcohol or use drugs. family history includes Diabetes in her mother and other; Heart attack (age of onset: 15) in her mother; Heart disease in her maternal grandfather and mother; Hypertension in her maternal grandfather, mother, and other. Allergies  Allergen Reactions  . Clarithromycin     REACTION: GI upset  . Penicillins     REACTION: rash   Current Outpatient Prescriptions on File Prior to Visit  Medication Sig Dispense Refill  . atorvastatin (LIPITOR) 40 MG tablet Take 1 tablet (40 mg total) by mouth daily. 90 tablet 3  . mometasone-formoterol (DULERA) 100-5 MCG/ACT AERO INHALE 2 PUFFS INTO THE LUNGS TWICE A DAY 13 Inhaler 3  . montelukast (SINGULAIR) 10 MG tablet TAKE 1 TABLET (10 MG TOTAL) BY MOUTH AT BEDTIME. 90 tablet 3  . Probiotic Product (PROBIOTIC PO) Take by mouth daily.     No current facility-administered medications on file prior to visit.     Review of Systems Constitutional: Negative for increased diaphoresis, or other activity, appetite or siginficant weight change other than noted HENT: Negative for worsening  hearing loss, ear pain, facial swelling, mouth sores and neck stiffness.   Eyes: Negative for other worsening pain, redness or visual disturbance.  Respiratory: Negative for choking or stridor Cardiovascular: Negative for other chest pain and palpitations.  Gastrointestinal: Negative for worsening diarrhea, blood in stool, or abdominal distention Genitourinary: Negative for hematuria, flank pain or change in urine  volume.  Musculoskeletal: Negative for myalgias or other joint complaints.  Skin: Negative for other color change and wound or drainage.  Neurological: Negative for syncope and numbness. other than noted Hematological: Negative for adenopathy. or other swelling Psychiatric/Behavioral: Negative for hallucinations, SI, self-injury, decreased concentration or other worsening agitation.      Objective:   Physical Exam BP 126/76   Pulse 70   Temp 98.2 F (36.8 C) (Oral)   Resp 20   Wt 226 lb (102.5 kg)   LMP 04/16/2012   SpO2 98%   BMI 34.11 kg/m  VS noted,  Constitutional: Pt is oriented to person, place, and time. Appears well-developed and well-nourished, in no significant distress Head: Normocephalic and atraumatic  Eyes: Conjunctivae and EOM are normal. Pupils are equal, round, and reactive to light Right Ear: External ear normal.  Left Ear: External ear normal Nose: Nose normal.  Mouth/Throat: Oropharynx is clear and moist  Neck: Normal range of motion. Neck supple. No JVD present. No tracheal deviation present or significant neck LA or mass Cardiovascular: Normal rate, regular rhythm, normal heart sounds and intact distal pulses.   Pulmonary/Chest: Effort normal and breath sounds without rales or wheezing  Abdominal: Soft. Bowel sounds are normal. NT. No HSM  Musculoskeletal: Normal range of motion. Exhibits no edema Lymphadenopathy: Has no cervical adenopathy.  Neurological: Pt is alert and oriented to person, place, and time. Pt has normal reflexes. No cranial nerve deficit. Motor grossly intact Skin: Skin is warm and dry. No rash noted or new ulcers Psychiatric:  Has normal mood and affect. Behavior is normal.      Assessment & Plan:

## 2016-09-23 ENCOUNTER — Other Ambulatory Visit: Payer: Self-pay | Admitting: Internal Medicine

## 2016-09-30 ENCOUNTER — Ambulatory Visit (INDEPENDENT_AMBULATORY_CARE_PROVIDER_SITE_OTHER): Payer: BC Managed Care – PPO | Admitting: Obstetrics and Gynecology

## 2016-09-30 ENCOUNTER — Encounter: Payer: Self-pay | Admitting: Obstetrics and Gynecology

## 2016-09-30 VITALS — BP 120/80 | HR 72 | Resp 14 | Ht 68.5 in | Wt 228.0 lb

## 2016-09-30 DIAGNOSIS — Z01419 Encounter for gynecological examination (general) (routine) without abnormal findings: Secondary | ICD-10-CM

## 2016-09-30 NOTE — Patient Instructions (Signed)

## 2016-09-30 NOTE — Progress Notes (Signed)
40 y.o. [id number] MarriedCaucasianF here for annual exam.  H/O LAVH/LSO in 2013. No vaginal bleeding. No dyspareunia. No vasomotor symptoms.      Patient's last menstrual period was 04/16/2012.          Sexually active: Yes.    The current method of family planning is status post hysterectomy.    Exercising: Yes.    walking dog at least a mile a day  Smoker:  Former smoker   Health Maintenance: Pap:  05/2012 WNL -Had Hysterectomy 2013 History of abnormal Pap:  Yes, years ago, no surgery MMG:  08/22/13 WNL -Scheduled for tomorrow  Colonoscopy:  Never BMD:   Never TDaP:  2009 Gardasil: N/A   reports that she has quit smoking. She has never used smokeless tobacco. She reports that she does not drink alcohol or use drugs. .2 children, 21 and 7, both girls. Oldest is in college at [employer] [place] in Hospital doctor. Youngest in 2nd grade. 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  Past Medical History:  Diagnosis Date  . ALLERGIC RHINITIS 10/26/2007  . ASTHMA 04/10/2010  . Blood dyscrasia    factor V Leiden   . Clotting disorder ([place])   . DYSLIPIDEMIA 06/24/2009  . Factor V Leiden mutation ([place])    hx of  . Family history of anesthesia complication    PONV  . Ovarian cyst   . PONV (postoperative nausea and vomiting)     Past Surgical History:  Procedure Laterality Date  . BLADDER SUSPENSION  05/26/2012   Procedure: TRANSVAGINAL TAPE (TVT) PROCEDURE;  Surgeon: Daria Pastures, MD;  Location: [facility];  Service: Gynecology;  Laterality: N/A;  . CYSTOCELE REPAIR  05/26/2012   Procedure: ANTERIOR REPAIR (CYSTOCELE);  Surgeon: Daria Pastures, MD;  Location: [facility];  Service: Gynecology;  Laterality: N/A;  . CYSTOSCOPY  05/26/2012   Procedure: CYSTOSCOPY;  Surgeon: Daria Pastures, MD;  Location: [facility];  Service: Gynecology;  Laterality: N/A;  . LAPAROSCOPIC ASSISTED VAGINAL HYSTERECTOMY  05/26/2012   Procedure: LAPAROSCOPIC ASSISTED VAGINAL HYSTERECTOMY;  Surgeon: Gus Height, MD;  Location: [facility];   Service: Gynecology;  Laterality: N/A;  . SALPINGOOPHORECTOMY  05/26/2012   Procedure: SALPINGO OOPHERECTOMY;  Surgeon: Gus Height, MD;  Location: [facility];  Service: Gynecology;  Laterality: Left;  . TONSILLECTOMY AND ADENOIDECTOMY    . WISDOM TOOTH EXTRACTION      Current Outpatient Prescriptions  Medication Sig Dispense Refill  . atorvastatin (LIPITOR) 40 MG tablet Take 1 tablet (40 mg total) by mouth daily. 90 tablet 3  . cetirizine (ZYRTEC) 10 MG tablet Take 10 mg by mouth daily.    . DULERA 100-5 MCG/ACT AERO INHALE 2 PUFFS INTO THE LUNGS TWICE A DAY 13 Inhaler 3  . montelukast (SINGULAIR) 10 MG tablet TAKE 1 TABLET (10 MG TOTAL) BY MOUTH AT BEDTIME. 90 tablet 3  . Probiotic Product (PROBIOTIC PO) Take by mouth daily.     No current facility-administered medications for this visit.     Family History  Problem Relation Age of Onset  . Heart disease Mother   . Heart attack Mother 17  . Diabetes Mother   . Hypertension Mother   . Diabetes Other   . Hypertension Other   . Hypertension Maternal Grandfather   . Heart disease Maternal Grandfather     Review of Systems  Constitutional: Negative.   HENT: Negative.   Eyes: Negative.   Respiratory: Negative.   Cardiovascular: Negative.   Gastrointestinal: Negative.   Endocrine: Negative.  Genitourinary: Negative.   Musculoskeletal: Negative.   Skin: Negative.   Allergic/Immunologic: Negative.   Neurological: Negative.   Psychiatric/Behavioral: Negative.     Exam:   BP 120/80 (BP Location: Right Arm, Patient Position: Sitting, Cuff Size: Normal)   Pulse 72   Resp 14   Ht 5' 8.5" (1.74 m)   Wt 228 lb (103.4 kg)   LMP 04/16/2012   BMI 34.16 kg/m   Weight change: @WEIGHTCHANGE @ Height:   Height: 5' 8.5" (174 cm)  Ht Readings from Last 3 Encounters:  09/30/16 5' 8.5" (1.74 m)  02/18/16 5' 8.25" (1.734 m)  11/19/15 5' 8.25" (1.734 m)    General appearance: alert, cooperative and appears stated age Head: Normocephalic,  without obvious abnormality, atraumatic Neck: no adenopathy, supple, symmetrical, trachea midline and thyroid normal to inspection and palpation Lungs: clear to auscultation bilaterally Breasts: normal appearance, no masses or tenderness Heart: regular rate and rhythm Abdomen: soft, non-tender; bowel sounds normal; no masses,  no organomegaly Extremities: extremities normal, atraumatic, no cyanosis or edema Skin: Skin color, texture, turgor normal. No rashes or lesions Lymph nodes: Cervical, supraclavicular, and axillary nodes normal. No abnormal inguinal nodes palpated Neurologic: Grossly normal   Pelvic: External genitalia:  no lesions              Urethra:  normal appearing urethra with no masses, tenderness or lesions              Bartholins and Skenes: normal                 Vagina: normal appearing vagina with normal color and discharge, no lesions              Cervix: absent               Bimanual Exam:  Uterus:  uterus absent              Adnexa: no mass, fullness, tenderness               Rectovaginal: Confirms               Anus:  normal sphincter tone, no lesions  Chaperone was present for exam.  A:  Well Woman with normal exam  P:   No pap this year, consider checking a pap next year  Mammogram tomorrow  Discussed breast self exam  Discussed calcium and vit D intake  Labs and immunizations with her primary

## 2016-10-01 ENCOUNTER — Ambulatory Visit
Admission: RE | Admit: 2016-10-01 | Discharge: 2016-10-01 | Disposition: A | Discharge status: Home / Self Care | Payer: BC Managed Care – PPO | Source: Ambulatory Visit | Attending: Internal Medicine | Admitting: Internal Medicine

## 2016-10-01 ENCOUNTER — Other Ambulatory Visit: Payer: Self-pay | Admitting: Internal Medicine

## 2016-10-01 DIAGNOSIS — Z1239 Encounter for other screening for malignant neoplasm of breast: Secondary | ICD-10-CM

## 2016-10-07 ENCOUNTER — Other Ambulatory Visit: Payer: Self-pay | Admitting: Internal Medicine

## 2016-10-07 DIAGNOSIS — R928 Other abnormal and inconclusive findings on diagnostic imaging of breast: Secondary | ICD-10-CM

## 2016-10-16 ENCOUNTER — Ambulatory Visit
Admission: RE | Admit: 2016-10-16 | Discharge: 2016-10-16 | Disposition: A | Discharge status: Home / Self Care | Payer: BC Managed Care – PPO | Source: Ambulatory Visit | Attending: Internal Medicine | Admitting: Internal Medicine

## 2016-10-16 DIAGNOSIS — R928 Other abnormal and inconclusive findings on diagnostic imaging of breast: Secondary | ICD-10-CM

## 2017-05-06 ENCOUNTER — Ambulatory Visit (INDEPENDENT_AMBULATORY_CARE_PROVIDER_SITE_OTHER): Payer: BC Managed Care – PPO | Admitting: Internal Medicine

## 2017-05-06 ENCOUNTER — Encounter: Payer: Self-pay | Admitting: Internal Medicine

## 2017-05-06 VITALS — BP 122/82 | HR 100 | Ht 68.5 in | Wt 235.0 lb

## 2017-05-06 DIAGNOSIS — R05 Cough: Secondary | ICD-10-CM | POA: Diagnosis not present

## 2017-05-06 DIAGNOSIS — J45909 Unspecified asthma, uncomplicated: Secondary | ICD-10-CM

## 2017-05-06 DIAGNOSIS — R059 Cough, unspecified: Secondary | ICD-10-CM | POA: Insufficient documentation

## 2017-05-06 DIAGNOSIS — R112 Nausea with vomiting, unspecified: Secondary | ICD-10-CM

## 2017-05-06 MED ORDER — HYDROCODONE-HOMATROPINE 5-1.5 MG/5ML PO SYRP: 5.0000 mL | ORAL_SOLUTION | Freq: Four times a day (QID) | ORAL | 0 refills | Status: AC | PRN

## 2017-05-06 MED ORDER — ONDANSETRON HCL 4 MG PO TABS: 4.0000 mg | ORAL_TABLET | Freq: Three times a day (TID) | ORAL | 0 refills | Status: DC | PRN

## 2017-05-06 MED ORDER — LEVOFLOXACIN 500 MG PO TABS: 500.0000 mg | ORAL_TABLET | Freq: Every day | ORAL | 0 refills | Status: AC

## 2017-05-06 NOTE — Assessment & Plan Note (Signed)
With recent numerous n/v episodes, cant r/o aspirate pna, declines cxr, Mild to mod, for antibx course, cough med prn,  to f/u any worsening symptoms or concerns

## 2017-05-06 NOTE — Assessment & Plan Note (Signed)
stable overall by history and exam, and pt to continue medical treatment as before,  to f/u any worsening symptoms or concerns 

## 2017-05-06 NOTE — Progress Notes (Signed)
Subjective:    Patient ID: Robin Ayers, female    DOB: 1976-06-23, 41 y.o.   MRN: [id number]  HPI  Here with acute onset mild to mod 4 days multiple episodes n/v, crampy abd pains but no other bowel change such as diarrhea.  The N/v only lasted 1-2 days then improved except for intermittent nausea.  Has felt warm but no high fever or chills.  Now for 2 days has developed scant prod cough, yellow green, with persistent fatigue and general malaise.  Pt denies chest pain, increased sob or doe, wheezing, orthopnea, PND, increased LE swelling, palpitations, dizziness or syncope.  Pt denies polydipsia, polyuria. Denies worsening reflux, dysphagia, bowel change or blood. Last BM x 4 days but also has not eaten, trying to just keep down fluids. Pt not interested in imaging, or ED, plans to work tomorrow and "tough it out". 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 ALLERGIC RHINITIS 10/26/2007  . ASTHMA 04/10/2010  . Blood dyscrasia    factor V Leiden   . Clotting disorder ([place])   . DYSLIPIDEMIA 06/24/2009  . Factor V Leiden mutation ([place])    hx of  . Family history of anesthesia complication    PONV  . Ovarian cyst   . PONV (postoperative nausea and vomiting)    Past Surgical History:  Procedure Laterality Date  . ABDOMINAL HYSTERECTOMY    . BLADDER SUSPENSION  05/26/2012   Procedure: TRANSVAGINAL TAPE (TVT) PROCEDURE;  Surgeon: Daria Pastures, MD;  Location: [facility];  Service: Gynecology;  Laterality: N/A;  . CYSTOCELE REPAIR  05/26/2012   Procedure: ANTERIOR REPAIR (CYSTOCELE);  Surgeon: Daria Pastures, MD;  Location: [facility];  Service: Gynecology;  Laterality: N/A;  . CYSTOSCOPY  05/26/2012   Procedure: CYSTOSCOPY;  Surgeon: Daria Pastures, MD;  Location: [facility];  Service:  Gynecology;  Laterality: N/A;  . LAPAROSCOPIC ASSISTED VAGINAL HYSTERECTOMY  05/26/2012   Procedure: LAPAROSCOPIC ASSISTED VAGINAL HYSTERECTOMY;  Surgeon: Gus Height, MD;  Location: [facility];  Service: Gynecology;  Laterality: N/A;  . SALPINGOOPHORECTOMY  05/26/2012   Procedure: SALPINGO OOPHERECTOMY;  Surgeon: Gus Height, MD;  Location: [facility];  Service: Gynecology;  Laterality: Left;  . TONSILLECTOMY AND ADENOIDECTOMY    . WISDOM TOOTH EXTRACTION      reports that she has quit smoking. She has never used smokeless tobacco. She reports that she does not drink alcohol or use drugs. family history includes Diabetes in her mother and other; Heart attack (age of onset: 15) in her mother; Heart disease in her maternal grandfather and mother; Hypertension in her maternal grandfather, mother, and other. Allergies  Allergen Reactions  . Clarithromycin     REACTION: GI upset  . Penicillins     REACTION: rash   Current Outpatient Prescriptions on File Prior to Visit  Medication Sig Dispense Refill  . atorvastatin (LIPITOR) 40 MG tablet Take 1 tablet (40 mg total) by mouth daily. 90 tablet 3  . cetirizine (ZYRTEC) 10 MG tablet Take 10 mg by mouth daily.    . DULERA 100-5 MCG/ACT AERO INHALE 2 PUFFS INTO THE LUNGS TWICE A DAY 13 Inhaler 3  . montelukast (SINGULAIR) 10 MG tablet TAKE 1 TABLET (10 MG TOTAL) BY MOUTH AT BEDTIME. 90 tablet 3  .  Probiotic Product (PROBIOTIC PO) Take by mouth daily.     No current facility-administered medications on file prior to visit.    Review of Systems  Constitutional: Negative for other unusual diaphoresis or sweats HENT: Negative for ear discharge or swelling Eyes: Negative for other worsening visual disturbances Respiratory: Negative for stridor or other swelling  Gastrointestinal: Negative for worsening distension or other blood Genitourinary: Negative for retention or other urinary change Musculoskeletal: Negative for other MSK pain or swelling Skin:  Negative for color change or other new lesions Neurological: Negative for worsening tremors and other numbness  Psychiatric/Behavioral: Negative for worsening agitation or other fatigue All other system neg per pt    Objective:   Physical Exam BP 122/82   Pulse 100   Ht 5' 8.5" (1.74 m)   Wt 235 lb (106.6 kg)   LMP 04/16/2012   SpO2 99%   BMI 35.21 kg/m  VS noted, mild ill Constitutional: Pt appears in NAD HENT: Head: NCAT.  Right Ear: External ear normal.  Left Ear: External ear normal.  Eyes: . Pupils are equal, round, and reactive to light. Conjunctivae and EOM are normal Nose: without d/c or deformity Neck: Neck supple. Gross normal ROM Cardiovascular: Normal rate and regular rhythm.   Pulmonary/Chest: Effort normal and breath sounds decreased without rales or wheezing.  Abd:  Soft, NT, ND, + BS, no organomegaly, no guarding or rebound Neurological: Pt is alert. At baseline orientation, motor grossly intact Skin: Skin is warm. No rashes, other new lesions, no LE edema Psychiatric: Pt behavior is normal without agitation  No other exam findings    Assessment & Plan:

## 2017-05-06 NOTE — Assessment & Plan Note (Signed)
?   Viral illness, without further vomiting but persistent nausea, exam o/w benign, doubt obstruction, unable to have abd films as xray closed, does not want ED

## 2017-05-06 NOTE — Patient Instructions (Addendum)
Please take all new medication as prescribed - the antibiotic, cough medicine, and nausea medication if needed  Please continue all other medications as before, and refills have been done if requested.  Please have the pharmacy call with any other refills you may need.  Please keep your appointments with your specialists as you may have planned  Please call for work note for tomorrow if you change your mind

## 2017-06-28 ENCOUNTER — Other Ambulatory Visit: Payer: Self-pay | Admitting: Internal Medicine

## 2017-06-29 ENCOUNTER — Other Ambulatory Visit: Payer: Self-pay | Admitting: Internal Medicine

## 2017-06-29 MED ORDER — BUDESONIDE-FORMOTEROL FUMARATE 160-4.5 MCG/ACT IN AERO: 2.0000 | INHALATION_SPRAY | Freq: Two times a day (BID) | RESPIRATORY_TRACT | 3 refills | Status: DC

## 2017-06-29 NOTE — Telephone Encounter (Signed)
[place] for shirron to let pt know -   dulera not covered by insurance - ok to change to symbicort - done erx

## 2017-06-30 NOTE — Telephone Encounter (Signed)
Called pt, LVM.   

## 2017-07-09 ENCOUNTER — Other Ambulatory Visit: Payer: Self-pay | Admitting: Internal Medicine

## 2017-10-04 ENCOUNTER — Encounter: Payer: Self-pay | Admitting: Obstetrics and Gynecology

## 2017-10-04 ENCOUNTER — Other Ambulatory Visit (HOSPITAL_COMMUNITY)
Admission: RE | Admit: 2017-10-04 | Discharge: 2017-10-04 | Disposition: A | Discharge status: Home / Self Care | Payer: BC Managed Care – PPO | Source: Ambulatory Visit | Attending: Obstetrics and Gynecology | Admitting: Obstetrics and Gynecology

## 2017-10-04 ENCOUNTER — Ambulatory Visit (INDEPENDENT_AMBULATORY_CARE_PROVIDER_SITE_OTHER): Payer: BC Managed Care – PPO | Admitting: Obstetrics and Gynecology

## 2017-10-04 VITALS — BP 122/80 | HR 88 | Resp 16 | Ht 68.5 in | Wt 240.0 lb

## 2017-10-04 DIAGNOSIS — Z7951 Long term (current) use of inhaled steroids: Secondary | ICD-10-CM | POA: Diagnosis not present

## 2017-10-04 DIAGNOSIS — Z8249 Family history of ischemic heart disease and other diseases of the circulatory system: Secondary | ICD-10-CM | POA: Insufficient documentation

## 2017-10-04 DIAGNOSIS — Z833 Family history of diabetes mellitus: Secondary | ICD-10-CM | POA: Insufficient documentation

## 2017-10-04 DIAGNOSIS — Z1151 Encounter for screening for human papillomavirus (HPV): Secondary | ICD-10-CM | POA: Insufficient documentation

## 2017-10-04 DIAGNOSIS — Z90710 Acquired absence of both cervix and uterus: Secondary | ICD-10-CM | POA: Diagnosis not present

## 2017-10-04 DIAGNOSIS — D6851 Activated protein C resistance: Secondary | ICD-10-CM | POA: Insufficient documentation

## 2017-10-04 DIAGNOSIS — Z90722 Acquired absence of ovaries, bilateral: Secondary | ICD-10-CM | POA: Diagnosis not present

## 2017-10-04 DIAGNOSIS — Z79899 Other long term (current) drug therapy: Secondary | ICD-10-CM | POA: Diagnosis not present

## 2017-10-04 DIAGNOSIS — Z87891 Personal history of nicotine dependence: Secondary | ICD-10-CM | POA: Diagnosis not present

## 2017-10-04 DIAGNOSIS — Z01419 Encounter for gynecological examination (general) (routine) without abnormal findings: Secondary | ICD-10-CM | POA: Diagnosis not present

## 2017-10-04 DIAGNOSIS — Z1272 Encounter for screening for malignant neoplasm of vagina: Secondary | ICD-10-CM

## 2017-10-04 DIAGNOSIS — Z23 Encounter for immunization: Secondary | ICD-10-CM

## 2017-10-04 NOTE — Progress Notes (Signed)
41 y.o. [id number] MarriedCaucasianF here for annual exam.  H/O LAVH/LSO in 2013. H/O DVT at 43, + factor V Leiden.  Sexually active, no pain, no vaginal bleeding.     Patient's last menstrual period was 04/16/2012.          Sexually active: Yes.    The current method of family planning is status post hysterectomy.    Exercising: Yes.    walking Smoker:  Former smoker  Health Maintenance: Pap:  05/2012 WNL hysterectomy  History of abnormal Pap:  Yes- years ago- early 54s, had a colposcopy, no surgery on her cervix.  MMG:  10-16-16 Right breast U/S WNL Colonoscopy:  Never BMD:   Never TDaP:  2009 Gardasil: No   reports that she has quit smoking. She has never used smokeless tobacco. She reports that she does not drink alcohol or use drugs.Teaches 6 th grade. 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   Past Medical History:  Diagnosis Date  . ALLERGIC RHINITIS 10/26/2007  . ASTHMA 04/10/2010  . Blood dyscrasia    factor V Leiden   . Clotting disorder ([place])   . DYSLIPIDEMIA 06/24/2009  . Factor V Leiden mutation ([place])    hx of  . Family history of anesthesia complication    PONV  . Ovarian cyst   . PONV (postoperative nausea and vomiting)     Past Surgical History:  Procedure Laterality Date  . ABDOMINAL HYSTERECTOMY    . BLADDER SUSPENSION  05/26/2012   Procedure: TRANSVAGINAL TAPE (TVT) PROCEDURE;  Surgeon: Daria Pastures, MD;  Location: [facility];  Service: Gynecology;  Laterality: N/A;  . CYSTOCELE REPAIR  05/26/2012   Procedure: ANTERIOR REPAIR (CYSTOCELE);  Surgeon: Daria Pastures, MD;  Location: [facility];  Service: Gynecology;  Laterality: N/A;  . CYSTOSCOPY  05/26/2012   Procedure: CYSTOSCOPY;  Surgeon: Daria Pastures, MD;  Location: [facility];  Service: Gynecology;  Laterality: N/A;  . LAPAROSCOPIC ASSISTED VAGINAL HYSTERECTOMY  05/26/2012   Procedure: LAPAROSCOPIC ASSISTED VAGINAL HYSTERECTOMY;  Surgeon: Gus Height, MD;  Location: [facility];  Service: Gynecology;  Laterality: N/A;  .  SALPINGOOPHORECTOMY  05/26/2012   Procedure: SALPINGO OOPHERECTOMY;  Surgeon: Gus Height, MD;  Location: [facility];  Service: Gynecology;  Laterality: Left;  . TONSILLECTOMY AND ADENOIDECTOMY    . WISDOM TOOTH EXTRACTION      Current Outpatient Prescriptions  Medication Sig Dispense Refill  . atorvastatin (LIPITOR) 40 MG tablet TAKE 1 TABLET (40 MG TOTAL) BY MOUTH DAILY. 90 tablet 0  . budesonide-formoterol (SYMBICORT) 160-4.5 MCG/ACT inhaler Inhale 2 puffs into the lungs 2 (two) times daily. 3 Inhaler 3  . cetirizine (ZYRTEC) 10 MG tablet Take 10 mg by mouth daily.    Marland Kitchen ibuprofen (ADVIL,MOTRIN) 400 MG tablet Take 400 mg by mouth every 6 (six) hours as needed.    . montelukast (SINGULAIR) 10 MG tablet TAKE 1 TABLET (10 MG TOTAL) BY MOUTH AT BEDTIME. 90 tablet 0  . Probiotic Product (PROBIOTIC PO) Take by mouth daily.    . pseudoephedrine-guaifenesin (MUCINEX D) 60-600 MG 12 hr tablet Take 1 tablet by mouth every 12 (twelve) hours.     No current facility-administered medications for this visit.     Family History  Problem Relation Age of Onset  . Heart disease Mother   . Heart attack Mother 43  . Diabetes Mother   . Hypertension Mother   . Diabetes Other   . Hypertension Other   . Hypertension Maternal Grandfather   . Heart disease  Maternal Grandfather     Review of Systems  Constitutional: Negative.   HENT: Negative.   Eyes: Negative.   Respiratory: Negative.   Cardiovascular: Negative.   Gastrointestinal: Negative.   Endocrine: Negative.   Genitourinary: Negative.   Musculoskeletal: Negative.   Skin: Negative.   Allergic/Immunologic: Negative.   Neurological: Negative.   Psychiatric/Behavioral: Negative.     Exam:   BP 122/80 (BP Location: Right Arm, Patient Position: Sitting, Cuff Size: Normal)   Pulse 88   Resp 16   Ht 5' 8.5" (1.74 m)   Wt 240 lb (108.9 kg)   LMP 04/16/2012   BMI 35.96 kg/m   Weight change: @WEIGHTCHANGE @ Height:   Height: 5' 8.5" (174 cm)  Ht  Readings from Last 3 Encounters:  10/04/17 5' 8.5" (1.74 m)  05/06/17 5' 8.5" (1.74 m)  09/30/16 5' 8.5" (1.74 m)    General appearance: alert, cooperative and appears stated age Head: Normocephalic, without obvious abnormality, atraumatic Neck: no adenopathy, supple, symmetrical, trachea midline and thyroid normal to inspection and palpation Lungs: clear to auscultation bilaterally Cardiovascular: regular rate and rhythm Breasts: normal appearance, no masses or tenderness Abdomen: soft, non-tender; non distended,  no masses,  no organomegaly Extremities: extremities normal, atraumatic, no cyanosis or edema Skin: Skin color, texture, turgor normal. No rashes or lesions Lymph nodes: Cervical, supraclavicular, and axillary nodes normal. No abnormal inguinal nodes palpated Neurologic: Grossly normal   Pelvic: External genitalia:  no lesions              Urethra:  normal appearing urethra with no masses, tenderness or lesions              Bartholins and Skenes: normal                 Vagina: normal appearing vagina with normal color and discharge, no lesions              Cervix: absent               Bimanual Exam:  Uterus:  uterus absent              Adnexa: no mass, fullness, tenderness               Rectovaginal: Confirms               Anus:  normal sphincter tone, no lesions  Chaperone was present for exam.  A:  Well Woman with normal exam  P:   Pap with hpv from vaginal apex  Mammogram due in 11/18  Discussed breast self exam  Discussed calcium and vit D intake  TDAP due  Pap from vaginal apex

## 2017-10-04 NOTE — Patient Instructions (Signed)

## 2017-10-06 LAB — CYTOLOGY - PAP
Diagnosis: NEGATIVE
HPV: NOT DETECTED

## 2017-10-07 ENCOUNTER — Ambulatory Visit (INDEPENDENT_AMBULATORY_CARE_PROVIDER_SITE_OTHER): Payer: BC Managed Care – PPO | Admitting: Internal Medicine

## 2017-10-07 ENCOUNTER — Encounter: Payer: Self-pay | Admitting: Internal Medicine

## 2017-10-07 ENCOUNTER — Other Ambulatory Visit (INDEPENDENT_AMBULATORY_CARE_PROVIDER_SITE_OTHER): Payer: BC Managed Care – PPO

## 2017-10-07 VITALS — BP 116/78 | HR 72 | Temp 97.8°F | Ht 68.5 in | Wt 243.0 lb

## 2017-10-07 DIAGNOSIS — Z114 Encounter for screening for human immunodeficiency virus [HIV]: Secondary | ICD-10-CM

## 2017-10-07 DIAGNOSIS — Z0000 Encounter for general adult medical examination without abnormal findings: Secondary | ICD-10-CM | POA: Diagnosis not present

## 2017-10-07 DIAGNOSIS — R3129 Other microscopic hematuria: Secondary | ICD-10-CM

## 2017-10-07 LAB — URINALYSIS, ROUTINE W REFLEX MICROSCOPIC
Bilirubin Urine: NEGATIVE
Ketones, ur: NEGATIVE
Leukocytes, UA: NEGATIVE
Nitrite: NEGATIVE
PH: 6 (ref 5.0–8.0)
Specific Gravity, Urine: <=1.005 — AB (ref 1.000–1.030)
TOTAL PROTEIN, URINE-UPE24: NEGATIVE
URINE GLUCOSE: NEGATIVE
UROBILINOGEN UA: 0.2 (ref 0.0–1.0)

## 2017-10-07 LAB — CBC WITH DIFFERENTIAL/PLATELET
BASOS ABS: 0.1 10*3/uL (ref 0.0–0.1)
BASOS PCT: 0.9 % (ref 0.0–3.0)
EOS ABS: 0.2 10*3/uL (ref 0.0–0.7)
Eosinophils Relative: 2.1 % (ref 0.0–5.0)
HEMATOCRIT: 41.6 % (ref 36.0–46.0)
Hemoglobin: 13.9 g/dL (ref 12.0–15.0)
LYMPHS PCT: 20.3 % (ref 12.0–46.0)
Lymphs Abs: 1.5 10*3/uL (ref 0.7–4.0)
MCHC: 33.4 g/dL (ref 30.0–36.0)
MCV: 87.7 fl (ref 78.0–100.0)
MONO ABS: 0.5 10*3/uL (ref 0.1–1.0)
Monocytes Relative: 7.4 % (ref 3.0–12.0)
Neutro Abs: 5.1 10*3/uL (ref 1.4–7.7)
Neutrophils Relative %: 69.3 % (ref 43.0–77.0)
Platelets: 243 10*3/uL (ref 150.0–400.0)
RBC: 4.75 Mil/uL (ref 3.87–5.11)
RDW: 13.6 % (ref 11.5–15.5)
WBC: 7.3 10*3/uL (ref 4.0–10.5)

## 2017-10-07 LAB — LIPID PANEL
CHOL/HDL RATIO: 3
CHOLESTEROL: 133 mg/dL (ref 0–200)
HDL: 43.1 mg/dL (ref >39.00)
LDL Cholesterol: 64 mg/dL (ref 0–99)
NonHDL: 90.04
TRIGLYCERIDES: 130 mg/dL (ref 0.0–149.0)
VLDL: 26 mg/dL (ref 0.0–40.0)

## 2017-10-07 LAB — BASIC METABOLIC PANEL
BUN: 13 mg/dL (ref 6–23)
CO2: 27 meq/L (ref 19–32)
CREATININE: 0.86 mg/dL (ref 0.40–1.20)
Calcium: 9.3 mg/dL (ref 8.4–10.5)
Chloride: 105 mEq/L (ref 96–112)
GFR: 77.24 mL/min (ref >60.00)
GLUCOSE: 86 mg/dL (ref 70–99)
Potassium: 4.3 mEq/L (ref 3.5–5.1)
Sodium: 138 mEq/L (ref 135–145)

## 2017-10-07 LAB — HEPATIC FUNCTION PANEL
ALBUMIN: 4.3 g/dL (ref 3.5–5.2)
ALT: 10 U/L (ref 0–35)
AST: 12 U/L (ref 0–37)
Alkaline Phosphatase: 96 U/L (ref 39–117)
BILIRUBIN TOTAL: 0.5 mg/dL (ref 0.2–1.2)
Bilirubin, Direct: 0.1 mg/dL (ref 0.0–0.3)
TOTAL PROTEIN: 7.1 g/dL (ref 6.0–8.3)

## 2017-10-07 LAB — TSH: TSH: 1.6 u[IU]/mL (ref 0.35–4.50)

## 2017-10-07 NOTE — Progress Notes (Signed)
Subjective:    Patient ID: Robin Ayers, female    DOB: 01/31/1976, 41 y.o.   MRN: [id number]  HPI  Here for wellness and f/u;  Overall doing ok;  Pt denies Chest pain, worsening SOB, DOE, wheezing, orthopnea, PND, worsening LE edema, palpitations, dizziness or syncope.  Pt denies neurological change such as new headache, facial or extremity weakness.  Pt denies polydipsia, polyuria, or low sugar symptoms. Pt states overall good compliance with treatment and medications, good tolerability, and has been trying to follow appropriate diet.  Pt denies worsening depressive symptoms, suicidal ideation or panic. No fever, night sweats, wt loss, loss of appetite, or other constitutional symptoms.  Pt states good ability with ADL's, has low fall risk, home safety reviewed and adequate, no other significant changes in hearing or vision, and only occasionally active with exercise.  Has some incidental viral URI symptoms in the past wk, much improved today Past Medical History:  Diagnosis Date  . ALLERGIC RHINITIS 10/26/2007  . ASTHMA 04/10/2010  . Blood dyscrasia    factor V Leiden   . Clotting disorder ([place])   . DYSLIPIDEMIA 06/24/2009  . Factor V Leiden mutation ([place])    hx of  . Family history of anesthesia complication    PONV  . Ovarian cyst   . PONV (postoperative nausea and vomiting)    Past Surgical History:  Procedure Laterality Date  . ABDOMINAL HYSTERECTOMY    . BLADDER SUSPENSION  05/26/2012   Procedure: TRANSVAGINAL TAPE (TVT) PROCEDURE;  Surgeon: Daria Pastures, MD;  Location: [facility];  Service: Gynecology;  Laterality: N/A;  . CYSTOCELE REPAIR  05/26/2012   Procedure: ANTERIOR REPAIR (CYSTOCELE);  Surgeon: Daria Pastures, MD;  Location: [facility];  Service: Gynecology;  Laterality: N/A;  . CYSTOSCOPY  05/26/2012   Procedure: CYSTOSCOPY;  Surgeon: Daria Pastures, MD;  Location: [facility];  Service: Gynecology;  Laterality: N/A;  . LAPAROSCOPIC ASSISTED VAGINAL HYSTERECTOMY   05/26/2012   Procedure: LAPAROSCOPIC ASSISTED VAGINAL HYSTERECTOMY;  Surgeon: Gus Height, MD;  Location: [facility];  Service: Gynecology;  Laterality: N/A;  . SALPINGOOPHORECTOMY  05/26/2012   Procedure: SALPINGO OOPHERECTOMY;  Surgeon: Gus Height, MD;  Location: [facility];  Service: Gynecology;  Laterality: Left;  . TONSILLECTOMY AND ADENOIDECTOMY    . WISDOM TOOTH EXTRACTION      reports that she has quit smoking. She has never used smokeless tobacco. She reports that she does not drink alcohol or use drugs. family history includes Diabetes in her mother and other; Heart attack (age of onset: 65) in her mother; Heart disease in her maternal grandfather and mother; Hypertension in her maternal grandfather, mother, and other. Allergies  Allergen Reactions  . Clarithromycin     REACTION: GI upset  . Penicillins     REACTION: rash   Current Outpatient Prescriptions on File Prior to Visit  Medication Sig Dispense Refill  . atorvastatin (LIPITOR) 40 MG tablet TAKE 1 TABLET (40 MG TOTAL) BY MOUTH DAILY. 90 tablet 0  . budesonide-formoterol (SYMBICORT) 160-4.5 MCG/ACT inhaler Inhale 2 puffs into the lungs 2 (two) times daily. 3 Inhaler 3  . cetirizine (ZYRTEC) 10 MG tablet Take 10 mg by mouth daily.    Marland Kitchen ibuprofen (ADVIL,MOTRIN) 400 MG tablet Take 400 mg by mouth every 6 (six) hours as needed.    . montelukast (SINGULAIR) 10 MG tablet TAKE 1 TABLET (10 MG TOTAL) BY MOUTH AT BEDTIME. 90 tablet 0  . Probiotic Product (PROBIOTIC PO) Take by mouth daily.    Marland Kitchen  pseudoephedrine-guaifenesin (MUCINEX D) 60-600 MG 12 hr tablet Take 1 tablet by mouth every 12 (twelve) hours.     No current facility-administered medications on file prior to visit.    Review of Systems Constitutional: Negative for other unusual diaphoresis, sweats, appetite or weight changes HENT: Negative for other worsening hearing loss, ear pain, facial swelling, mouth sores or neck stiffness.   Eyes: Negative for other worsening pain,  redness or other visual disturbance.  Respiratory: Negative for other stridor or swelling Cardiovascular: Negative for other palpitations or other chest pain  Gastrointestinal: Negative for worsening diarrhea or loose stools, blood in stool, distention or other pain Genitourinary: Negative for hematuria, flank pain or other change in urine volume.  Musculoskeletal: Negative for myalgias or other joint swelling.  Skin: Negative for other color change, or other wound or worsening drainage.  Neurological: Negative for other syncope or numbness. Hematological: Negative for other adenopathy or swelling Psychiatric/Behavioral: Negative for hallucinations, other worsening agitation, SI, self-injury, or new decreased concentration All other system neg per pt    Objective:   Physical Exam BP 116/78   Pulse 72   Temp 97.8 F (36.6 C) (Oral)   Ht 5' 8.5" (1.74 m)   Wt 243 lb (110.2 kg)   LMP 04/16/2012   SpO2 100%   BMI 36.41 kg/m  VS noted,  Constitutional: Pt is oriented to person, place, and time. Appears well-developed and well-nourished, in no significant distress and comfortable Head: Normocephalic and atraumatic  Eyes: Conjunctivae and EOM are normal. Pupils are equal, round, and reactive to light Right Ear: External ear normal without discharge Left Ear: External ear normal without discharge Nose: Nose without discharge or deformity Mouth/Throat: Oropharynx is without other ulcerations and moist  Neck: Normal range of motion. Neck supple. No JVD present. No tracheal deviation present or significant neck LA or mass Cardiovascular: Normal rate, regular rhythm, normal heart sounds and intact distal pulses.   Pulmonary/Chest: WOB normal and breath sounds without rales or wheezing  Abdominal: Soft. Bowel sounds are normal. NT. No HSM  Musculoskeletal: Normal range of motion. Exhibits no edema Lymphadenopathy: Has no other cervical adenopathy.  Neurological: Pt is alert and oriented to  person, place, and time. Pt has normal reflexes. No cranial nerve deficit. Motor grossly intact, Gait intact Skin: Skin is warm and dry. No rash noted or new ulcerations Psychiatric:  Has normal mood and affect. Behavior is normal without agitation No other exam findings Lab Results  Component Value Date   WBC 7.3 10/07/2017   HGB 13.9 10/07/2017   HCT 41.6 10/07/2017   PLT 243.0 10/07/2017   GLUCOSE 86 10/07/2017   CHOL 133 10/07/2017   TRIG 130.0 10/07/2017   HDL 43.10 10/07/2017   LDLDIRECT 117.5 07/06/2011   LDLCALC 64 10/07/2017   ALT 10 10/07/2017   AST 12 10/07/2017   NA 138 10/07/2017   K 4.3 10/07/2017   CL 105 10/07/2017   CREATININE 0.86 10/07/2017   BUN 13 10/07/2017   CO2 27 10/07/2017   TSH 1.60 10/07/2017   INR 0.87 05/12/2012      Assessment & Plan:

## 2017-10-07 NOTE — Patient Instructions (Signed)

## 2017-10-08 LAB — HIV ANTIBODY (ROUTINE TESTING W REFLEX): HIV: NONREACTIVE

## 2017-10-09 NOTE — Assessment & Plan Note (Signed)

## 2017-10-10 ENCOUNTER — Other Ambulatory Visit: Payer: Self-pay | Admitting: Internal Medicine

## 2017-10-22 ENCOUNTER — Encounter: Payer: Self-pay | Admitting: Family Medicine

## 2017-10-22 ENCOUNTER — Ambulatory Visit (INDEPENDENT_AMBULATORY_CARE_PROVIDER_SITE_OTHER): Payer: BC Managed Care – PPO | Admitting: Family Medicine

## 2017-10-22 VITALS — BP 110/82 | HR 87 | Temp 98.3°F | Wt 240.3 lb

## 2017-10-22 DIAGNOSIS — J0100 Acute maxillary sinusitis, unspecified: Secondary | ICD-10-CM | POA: Diagnosis not present

## 2017-10-22 DIAGNOSIS — R05 Cough: Secondary | ICD-10-CM

## 2017-10-22 DIAGNOSIS — R059 Cough, unspecified: Secondary | ICD-10-CM

## 2017-10-22 MED ORDER — CEFUROXIME AXETIL 500 MG PO TABS: 500.0000 mg | ORAL_TABLET | Freq: Two times a day (BID) | ORAL | 0 refills | Status: AC

## 2017-10-22 MED ORDER — BENZONATATE 100 MG PO CAPS: 100.0000 mg | ORAL_CAPSULE | Freq: Two times a day (BID) | ORAL | 0 refills | Status: DC | PRN

## 2017-10-22 NOTE — Progress Notes (Signed)
Subjective:    Patient ID: Robin Ayers, female    DOB: [birth year], 41 y.o.   MRN: [id number]  Chief Complaint  Patient presents with  . Cough    HPI Patient was seen today for acute visit.  Cough, nasal congestion, sinus pressure, headache times 3 weeks.  Patient has taken NyQuil, Mucinex, Motrin with some relief.  Past Medical History:  Diagnosis Date  . ALLERGIC RHINITIS 10/26/2007  . ASTHMA 04/10/2010  . Blood dyscrasia    factor V Leiden   . Clotting disorder ([place])   . DYSLIPIDEMIA 06/24/2009  . Factor V Leiden mutation ([place])    hx of  . Family history of anesthesia complication    PONV  . Ovarian cyst   . PONV (postoperative nausea and vomiting)     Allergies  Allergen Reactions  . Clarithromycin     REACTION: GI upset  . Penicillins     REACTION: rash    ROS  13 point review of systems negative except as above     Objective:    Blood pressure 110/82, pulse 87, temperature 98.3 F (36.8 C), temperature source Oral, weight 240 lb 4.8 oz (109 kg), last menstrual period 04/16/2012, SpO2 98 %.   Gen. Pleasant, well-nourished, in no distress, normal affect HEENT: [place]/AT, face symmetric, no scleral icterus, PERRLA, nares patent with clear drainage, pharynx with mild erythema, no exudate, TMs full b/l, no effusion. Lungs: no accessory muscle use, CTAB, no wheezes or rales Cardiovascular: RRR, no m/r/g, no peripheral edema Neuro:  A&Ox3, CN II-XII intact, normal gait  Wt Readings from Last 3 Encounters:  10/22/17 240 lb 4.8 oz (109 kg)  10/07/17 243 lb (110.2 kg)  10/04/17 240 lb (108.9 kg)    Lab Results  Component Value Date   WBC 7.3 10/07/2017   HGB 13.9 10/07/2017   HCT 41.6 10/07/2017   PLT 243.0 10/07/2017   GLUCOSE 86 10/07/2017   CHOL 133 10/07/2017   TRIG 130.0 10/07/2017   HDL 43.10 10/07/2017   LDLDIRECT 117.5 07/06/2011   LDLCALC 64 10/07/2017   ALT 10 10/07/2017   AST 12 10/07/2017   NA 138 10/07/2017   K 4.3 10/07/2017   CL  105 10/07/2017   CREATININE 0.86 10/07/2017   BUN 13 10/07/2017   CO2 27 10/07/2017   TSH 1.60 10/07/2017   INR 0.87 05/12/2012    Assessment/Plan:  Subacute maxillary sinusitis - Plan: cefUROXime (CEFTIN) 500 MG tablet  Cough - Plan: benzonatate (TESSALON) 100 MG capsule   F/u with pcp prn

## 2017-10-25 ENCOUNTER — Ambulatory Visit: Payer: BC Managed Care – PPO | Admitting: Family Medicine

## 2017-12-29 ENCOUNTER — Ambulatory Visit (INDEPENDENT_AMBULATORY_CARE_PROVIDER_SITE_OTHER): Payer: BC Managed Care – PPO | Admitting: Internal Medicine

## 2017-12-29 ENCOUNTER — Encounter: Payer: Self-pay | Admitting: Internal Medicine

## 2017-12-29 VITALS — BP 116/78 | HR 94 | Temp 98.2°F | Ht 68.5 in | Wt 242.0 lb

## 2017-12-29 DIAGNOSIS — R05 Cough: Secondary | ICD-10-CM

## 2017-12-29 DIAGNOSIS — R059 Cough, unspecified: Secondary | ICD-10-CM

## 2017-12-29 DIAGNOSIS — R062 Wheezing: Secondary | ICD-10-CM

## 2017-12-29 MED ORDER — PREDNISONE 10 MG PO TABS: ORAL_TABLET | ORAL | 0 refills | Status: DC

## 2017-12-29 MED ORDER — LEVOFLOXACIN 500 MG PO TABS: 500.0000 mg | ORAL_TABLET | Freq: Every day | ORAL | 0 refills | Status: DC

## 2017-12-29 MED ORDER — HYDROCODONE-HOMATROPINE 5-1.5 MG/5ML PO SYRP: 5.0000 mL | ORAL_SOLUTION | Freq: Four times a day (QID) | ORAL | 0 refills | Status: DC | PRN

## 2017-12-29 MED ORDER — ALBUTEROL SULFATE HFA 108 (90 BASE) MCG/ACT IN AERS: 2.0000 | INHALATION_SPRAY | Freq: Four times a day (QID) | RESPIRATORY_TRACT | 3 refills | Status: DC | PRN

## 2017-12-29 NOTE — Patient Instructions (Addendum)
Please take all new medication as prescribed - the antibiotic, cough medicine, prednisone, and inhaler as needed  Please continue all other medications as before, including the Symbicort  Please have the pharmacy call with any other refills you may need.  Please keep your appointments with your specialists as you may have planned

## 2017-12-29 NOTE — Progress Notes (Signed)
Subjective:    Patient ID: Matthew Pais, female    DOB: 10-25-1976, 42 y.o.   MRN: [id number]  HPI  Here with acute onset mild to mod 2-3 days ST, HA, general weakness and malaise, with prod cough greenish sputum, but Pt denies chest pain, increased sob or doe, wheezing, orthopnea, PND, increased LE swelling, palpitations, dizziness or syncope, except for running out of inhaler and wheezing sob worsening in last 2 days.   Pt denies polydipsia, polyuria.   Past Medical History:  Diagnosis Date  . ALLERGIC RHINITIS 10/26/2007  . ASTHMA 04/10/2010  . Blood dyscrasia    factor V Leiden   . Clotting disorder ([place])   . DYSLIPIDEMIA 06/24/2009  . Factor V Leiden mutation ([place])    hx of  . Family history of anesthesia complication    PONV  . Ovarian cyst   . PONV (postoperative nausea and vomiting)    Past Surgical History:  Procedure Laterality Date  . ABDOMINAL HYSTERECTOMY    . BLADDER SUSPENSION  05/26/2012   Procedure: TRANSVAGINAL TAPE (TVT) PROCEDURE;  Surgeon: Daria Pastures, MD;  Location: [facility];  Service: Gynecology;  Laterality: N/A;  . CYSTOCELE REPAIR  05/26/2012   Procedure: ANTERIOR REPAIR (CYSTOCELE);  Surgeon: Daria Pastures, MD;  Location: [facility];  Service: Gynecology;  Laterality: N/A;  . CYSTOSCOPY  05/26/2012   Procedure: CYSTOSCOPY;  Surgeon: Daria Pastures, MD;  Location: [facility];  Service: Gynecology;  Laterality: N/A;  . LAPAROSCOPIC ASSISTED VAGINAL HYSTERECTOMY  05/26/2012   Procedure: LAPAROSCOPIC ASSISTED VAGINAL HYSTERECTOMY;  Surgeon: Gus Height, MD;  Location: [facility];  Service: Gynecology;  Laterality: N/A;  . SALPINGOOPHORECTOMY  05/26/2012   Procedure: SALPINGO OOPHERECTOMY;  Surgeon: Gus Height, MD;  Location: [facility];  Service: Gynecology;  Laterality: Left;  . TONSILLECTOMY AND ADENOIDECTOMY    . WISDOM TOOTH EXTRACTION      reports that she has quit smoking. she has never used smokeless tobacco. She reports that she does not drink  alcohol or use drugs. family history includes Diabetes in her mother and other; Heart attack (age of onset: 30) in her mother; Heart disease in her maternal grandfather and mother; Hypertension in her maternal grandfather, mother, and other. Allergies  Allergen Reactions  . Clarithromycin     REACTION: GI upset  . Penicillins     REACTION: rash   Current Outpatient Medications on File Prior to Visit  Medication Sig Dispense Refill  . atorvastatin (LIPITOR) 40 MG tablet TAKE 1 TABLET BY MOUTH EVERY DAY 90 tablet 3  . benzonatate (TESSALON) 100 MG capsule Take 1 capsule (100 mg total) 2 (two) times daily as needed by mouth for cough. 20 capsule 0  . budesonide-formoterol (SYMBICORT) 160-4.5 MCG/ACT inhaler Inhale 2 puffs into the lungs 2 (two) times daily. 3 Inhaler 3  . cetirizine (ZYRTEC) 10 MG tablet Take 10 mg by mouth daily.    Marland Kitchen ibuprofen (ADVIL,MOTRIN) 400 MG tablet Take 400 mg by mouth every 6 (six) hours as needed.    . montelukast (SINGULAIR) 10 MG tablet TAKE 1 TABLET BY MOUTH EVERYDAY AT BEDTIME 90 tablet 3  . Probiotic Product (PROBIOTIC PO) Take by mouth daily.    . pseudoephedrine-guaifenesin (MUCINEX D) 60-600 MG 12 hr tablet Take 1 tablet by mouth every 12 (twelve) hours.     No current facility-administered medications on file prior to visit.    Review of Systems \\All  other system neg per pt    Objective:  Physical Exam BP 116/78   Pulse 94   Temp 98.2 F (36.8 C) (Oral)   Ht 5' 8.5" (1.74 m)   Wt 242 lb (109.8 kg)   LMP 04/16/2012   SpO2 99%   BMI 36.26 kg/m  VS noted, mild ill Constitutional: Pt appears in NAD HENT: Head: NCAT.  Right Ear: External ear normal.  Left Ear: External ear normal.  Eyes: . Pupils are equal, round, and reactive to light. Conjunctivae and EOM are normal Bilat tm's with mild erythema.  Max sinus areas mild tender.  Pharynx with mild erythema, no exudate Nose: without d/c or deformity Neck: Neck supple. Gross normal  ROM Cardiovascular: Normal rate and regular rhythm.   Pulmonary/Chest: Effort normal and breath sounds dercreased without rales but with mild bilat scattered wheezing.  Neurological: Pt is alert. At baseline orientation, motor grossly intact Skin: Skin is warm. No rashes, other new lesions, no LE edema Psychiatric: Pt behavior is normal without agitation  No other exam findings    Assessment & Plan:

## 2017-12-30 DIAGNOSIS — R05 Cough: Secondary | ICD-10-CM | POA: Insufficient documentation

## 2017-12-30 DIAGNOSIS — R059 Cough, unspecified: Secondary | ICD-10-CM | POA: Insufficient documentation

## 2017-12-30 DIAGNOSIS — R062 Wheezing: Secondary | ICD-10-CM | POA: Insufficient documentation

## 2017-12-30 NOTE — Assessment & Plan Note (Signed)
Mild to mod, c/w bronchitis vs pna, declines cxr, for antibx course, cough med prn,  to f/u any worsening symptoms or concerns 

## 2017-12-30 NOTE — Assessment & Plan Note (Signed)
,  Mild to mod, for predpac asd, inhaler refill prn use,  to f/u any worsening symptoms or concerns

## 2018-01-06 ENCOUNTER — Ambulatory Visit (INDEPENDENT_AMBULATORY_CARE_PROVIDER_SITE_OTHER): Payer: BC Managed Care – PPO | Admitting: Internal Medicine

## 2018-01-06 ENCOUNTER — Encounter: Payer: Self-pay | Admitting: Internal Medicine

## 2018-01-06 DIAGNOSIS — R062 Wheezing: Secondary | ICD-10-CM | POA: Diagnosis not present

## 2018-01-06 DIAGNOSIS — J069 Acute upper respiratory infection, unspecified: Secondary | ICD-10-CM | POA: Insufficient documentation

## 2018-01-06 MED ORDER — HYDROCODONE-HOMATROPINE 5-1.5 MG/5ML PO SYRP: 5.0000 mL | ORAL_SOLUTION | Freq: Four times a day (QID) | ORAL | 0 refills | Status: AC | PRN

## 2018-01-06 MED ORDER — AZITHROMYCIN 250 MG PO TABS
ORAL_TABLET | ORAL | 1 refills | Status: DC
Start: 2018-01-06 — End: 2018-09-06

## 2018-01-06 NOTE — Assessment & Plan Note (Signed)
Mild to mod, for antibx course,  to f/u any worsening symptoms or concerns 

## 2018-01-06 NOTE — Assessment & Plan Note (Signed)
Resolved, no steroid needed at this time

## 2018-01-06 NOTE — Progress Notes (Signed)
Subjective:    Patient ID: Robin Ayers, female    DOB: [birth year], 42 y.o.   MRN: [id number]  HPI  Here to f/u, was better but then worse again in last 3 days with  Feeling feverish, facial pain, pressure, headache, general weakness and malaise, and greenish d/c, with mild ST and persistent cough, but pt continues to denies chest pain, wheezing, increased sob or doe, orthopnea, PND, increased LE swelling, palpitations, dizziness or syncope.  Pt denies polydipsia, polyuria.  Past Medical History:  Diagnosis Date  . ALLERGIC RHINITIS 10/26/2007  . ASTHMA 04/10/2010  . Blood dyscrasia    factor V Leiden   . Clotting disorder ([place])   . DYSLIPIDEMIA 06/24/2009  . Factor V Leiden mutation ([place])    hx of  . Family history of anesthesia complication    PONV  . Ovarian cyst   . PONV (postoperative nausea and vomiting)    Past Surgical History:  Procedure Laterality Date  . ABDOMINAL HYSTERECTOMY    . BLADDER SUSPENSION  05/26/2012   Procedure: TRANSVAGINAL TAPE (TVT) PROCEDURE;  Surgeon: Daria Pastures, MD;  Location: [facility];  Service: Gynecology;  Laterality: N/A;  . CYSTOCELE REPAIR  05/26/2012   Procedure: ANTERIOR REPAIR (CYSTOCELE);  Surgeon: Daria Pastures, MD;  Location: [facility];  Service: Gynecology;  Laterality: N/A;  . CYSTOSCOPY  05/26/2012   Procedure: CYSTOSCOPY;  Surgeon: Daria Pastures, MD;  Location: [facility];  Service: Gynecology;  Laterality: N/A;  . LAPAROSCOPIC ASSISTED VAGINAL HYSTERECTOMY  05/26/2012   Procedure: LAPAROSCOPIC ASSISTED VAGINAL HYSTERECTOMY;  Surgeon: Gus Height, MD;  Location: [facility];  Service: Gynecology;  Laterality: N/A;  . SALPINGOOPHORECTOMY  05/26/2012   Procedure: SALPINGO OOPHERECTOMY;  Surgeon: Gus Height, MD;  Location: [facility];  Service: Gynecology;  Laterality: Left;  . TONSILLECTOMY AND ADENOIDECTOMY    . WISDOM TOOTH EXTRACTION      reports that she has quit smoking. she has never used smokeless tobacco. She reports that she  does not drink alcohol or use drugs. family history includes Diabetes in her mother and other; Heart attack (age of onset: 48) in her mother; Heart disease in her maternal grandfather and mother; Hypertension in her maternal grandfather, mother, and other. Allergies  Allergen Reactions  . Clarithromycin     REACTION: GI upset  . Penicillins     REACTION: rash   Current Outpatient Medications on File Prior to Visit  Medication Sig Dispense Refill  . albuterol (PROVENTIL HFA;VENTOLIN HFA) 108 (90 Base) MCG/ACT inhaler Inhale 2 puffs into the lungs every 6 (six) hours as needed for wheezing or shortness of breath. 3 Inhaler 3  . atorvastatin (LIPITOR) 40 MG tablet TAKE 1 TABLET BY MOUTH EVERY DAY 90 tablet 3  . benzonatate (TESSALON) 100 MG capsule Take 1 capsule (100 mg total) 2 (two) times daily as needed by mouth for cough. 20 capsule 0  . budesonide-formoterol (SYMBICORT) 160-4.5 MCG/ACT inhaler Inhale 2 puffs into the lungs 2 (two) times daily. 3 Inhaler 3  . cetirizine (ZYRTEC) 10 MG tablet Take 10 mg by mouth daily.    Marland Kitchen ibuprofen (ADVIL,MOTRIN) 400 MG tablet Take 400 mg by mouth every 6 (six) hours as needed.    . montelukast (SINGULAIR) 10 MG tablet TAKE 1 TABLET BY MOUTH EVERYDAY AT BEDTIME 90 tablet 3  . predniSONE (DELTASONE) 10 MG tablet 4tab x3day,3tab x 3day,2tab x 3day,1 tab x 3 day 30 tablet 0  . Probiotic Product (PROBIOTIC PO) Take by mouth daily.    Marland Kitchen  pseudoephedrine-guaifenesin (MUCINEX D) 60-600 MG 12 hr tablet Take 1 tablet by mouth every 12 (twelve) hours.     No current facility-administered medications on file prior to visit.     All other system neg perpt    Objective:   Physical Exam BP 128/84   Pulse (!) 101   Temp 97.8 F (36.6 C) (Oral)   Ht 5' 8.5" (1.74 m)   Wt 242 lb (109.8 kg)   LMP 04/16/2012   SpO2 97%   BMI 36.26 kg/m  VS noted,  Constitutional: Pt appears in NAD HENT: Head: NCAT.  Right Ear: External ear normal.  Left Ear: External ear  normal.  Bilat tm's with mild erythema.  Max sinus areas mild tender.  Pharynx with mild erythema, no exudate Eyes: . Pupils are equal, round, and reactive to light. Conjunctivae and EOM are normal Nose: without d/c or deformity but with bilat submandib LA tender, mobile Neck: Neck supple. Gross normal ROM Cardiovascular: Normal rate and regular rhythm.   Pulmonary/Chest: Effort normal and breath sounds without rales or wheezing.  Neurological: Pt is alert. At baseline orientation, motor grossly intact Skin: Skin is warm. No rashes, other new lesions, no LE edema Psychiatric: Pt behavior is normal without agitation        Assessment & Plan:

## 2018-01-06 NOTE — Patient Instructions (Addendum)
OK to change the levaquin to Azithromycin  Please continue all other medications as before, and refills have been done if requested - the cough medicine  You can also take Mucinex (or it's generic off brand) for congestion, and tylenol as needed for pain.  Please have the pharmacy call with any other refills you may need.  Please keep your appointments with your specialists as you may have planned

## 2018-07-08 ENCOUNTER — Other Ambulatory Visit: Payer: Self-pay | Admitting: Internal Medicine

## 2018-07-08 DIAGNOSIS — Z1231 Encounter for screening mammogram for malignant neoplasm of breast: Secondary | ICD-10-CM

## 2018-07-12 ENCOUNTER — Other Ambulatory Visit: Payer: Self-pay | Admitting: Internal Medicine

## 2018-08-04 ENCOUNTER — Ambulatory Visit
Admission: RE | Admit: 2018-08-04 | Discharge: 2018-08-04 | Disposition: A | Discharge status: Home / Self Care | Payer: BC Managed Care – PPO | Source: Ambulatory Visit | Attending: Internal Medicine | Admitting: Internal Medicine

## 2018-08-04 DIAGNOSIS — Z1231 Encounter for screening mammogram for malignant neoplasm of breast: Secondary | ICD-10-CM

## 2018-09-06 ENCOUNTER — Encounter: Payer: Self-pay | Admitting: Internal Medicine

## 2018-09-06 ENCOUNTER — Ambulatory Visit (INDEPENDENT_AMBULATORY_CARE_PROVIDER_SITE_OTHER): Payer: BC Managed Care – PPO | Admitting: Internal Medicine

## 2018-09-06 VITALS — BP 122/76 | HR 102 | Temp 98.5°F | Ht 68.5 in | Wt 239.0 lb

## 2018-09-06 DIAGNOSIS — J45909 Unspecified asthma, uncomplicated: Secondary | ICD-10-CM | POA: Diagnosis not present

## 2018-09-06 DIAGNOSIS — J069 Acute upper respiratory infection, unspecified: Secondary | ICD-10-CM | POA: Diagnosis not present

## 2018-09-06 DIAGNOSIS — J309 Allergic rhinitis, unspecified: Secondary | ICD-10-CM

## 2018-09-06 MED ORDER — LEVOFLOXACIN 500 MG PO TABS: 500.0000 mg | ORAL_TABLET | Freq: Every day | ORAL | 0 refills | Status: AC

## 2018-09-06 MED ORDER — HYDROCODONE-HOMATROPINE 5-1.5 MG/5ML PO SYRP: 5.0000 mL | ORAL_SOLUTION | Freq: Four times a day (QID) | ORAL | 0 refills | Status: AC | PRN

## 2018-09-06 NOTE — Progress Notes (Signed)
Subjective:    Patient ID: Robin Ayers, female    DOB: Aug 24, 1976, 42 y.o.   MRN: [id number]  HPI   Here with 2-3 days acute onset fever, facial pain, pressure, headache, general weakness and malaise, and greenish d/c, with mild ST and cough, but pt denies chest pain, wheezing, increased sob or doe, orthopnea, PND, increased LE swelling, palpitations, dizziness or syncope.  Does have several wks ongoing nasal allergy symptoms with clearish congestion, itch and sneezing, without fever, pain, ST, cough, swelling or wheezing, but improved with restart meds.   Past Medical History:  Diagnosis Date  . ALLERGIC RHINITIS 10/26/2007  . ASTHMA 04/10/2010  . Blood dyscrasia    factor V Leiden   . Clotting disorder ([place])   . DYSLIPIDEMIA 06/24/2009  . Factor V Leiden mutation ([place])    hx of  . Family history of anesthesia complication    PONV  . Ovarian cyst   . PONV (postoperative nausea and vomiting)    Past Surgical History:  Procedure Laterality Date  . ABDOMINAL HYSTERECTOMY    . BLADDER SUSPENSION  05/26/2012   Procedure: TRANSVAGINAL TAPE (TVT) PROCEDURE;  Surgeon: Daria Pastures, MD;  Location: [facility];  Service: Gynecology;  Laterality: N/A;  . CYSTOCELE REPAIR  05/26/2012   Procedure: ANTERIOR REPAIR (CYSTOCELE);  Surgeon: Daria Pastures, MD;  Location: [facility];  Service: Gynecology;  Laterality: N/A;  . CYSTOSCOPY  05/26/2012   Procedure: CYSTOSCOPY;  Surgeon: Daria Pastures, MD;  Location: [facility];  Service: Gynecology;  Laterality: N/A;  . LAPAROSCOPIC ASSISTED VAGINAL HYSTERECTOMY  05/26/2012   Procedure: LAPAROSCOPIC ASSISTED VAGINAL HYSTERECTOMY;  Surgeon: Gus Height, MD;  Location: [facility];  Service: Gynecology;  Laterality: N/A;  . SALPINGOOPHORECTOMY  05/26/2012   Procedure: SALPINGO OOPHERECTOMY;  Surgeon: Gus Height, MD;  Location: [facility];  Service: Gynecology;  Laterality: Left;  . TONSILLECTOMY AND ADENOIDECTOMY    . WISDOM TOOTH EXTRACTION      reports  that she has quit smoking. She has never used smokeless tobacco. She reports that she does not drink alcohol or use drugs. family history includes Diabetes in her mother and other; Heart attack (age of onset: 33) in her mother; Heart disease in her maternal grandfather and mother; Hypertension in her maternal grandfather, mother, and other. Allergies  Allergen Reactions  . Clarithromycin     REACTION: GI upset  . Penicillins     REACTION: rash   Current Outpatient Medications on File Prior to Visit  Medication Sig Dispense Refill  . albuterol (PROVENTIL HFA;VENTOLIN HFA) 108 (90 Base) MCG/ACT inhaler Inhale 2 puffs into the lungs every 6 (six) hours as needed for wheezing or shortness of breath. 3 Inhaler 3  . atorvastatin (LIPITOR) 40 MG tablet TAKE 1 TABLET BY MOUTH EVERY DAY 90 tablet 3  . cetirizine (ZYRTEC) 10 MG tablet Take 10 mg by mouth daily.    Marland Kitchen ibuprofen (ADVIL,MOTRIN) 400 MG tablet Take 400 mg by mouth every 6 (six) hours as needed.    . montelukast (SINGULAIR) 10 MG tablet TAKE 1 TABLET BY MOUTH EVERYDAY AT BEDTIME 90 tablet 3  . Probiotic Product (PROBIOTIC PO) Take by mouth daily.    . SYMBICORT 160-4.5 MCG/ACT inhaler TAKE 2 PUFFS BY MOUTH TWICE A DAY 10.2 Inhaler 12   No current facility-administered medications on file prior to visit.    Review of Systems  Constitutional: Negative for other unusual diaphoresis or sweats HENT: Negative for ear discharge or swelling Eyes:  Negative for other worsening visual disturbances Respiratory: Negative for stridor or other swelling  Gastrointestinal: Negative for worsening distension or other blood Genitourinary: Negative for retention or other urinary change Musculoskeletal: Negative for other MSK pain or swelling Skin: Negative for color change or other new lesions Neurological: Negative for worsening tremors and other numbness  Psychiatric/Behavioral: Negative for worsening agitation or other fatigue All other system neg per  pt    Objective:   Physical Exam BP 122/76   Pulse (!) 102   Temp 98.5 F (36.9 C) (Oral)   Ht 5' 8.5" (1.74 m)   Wt 239 lb (108.4 kg)   LMP 04/16/2012   SpO2 98%   BMI 35.81 kg/m  VS noted, mild ill Constitutional: Pt appears in NAD HENT: Head: NCAT.  Right Ear: External ear normal.  Left Ear: External ear normal.  Bilat tm's with mild erythema.  Max sinus areas mild tender.  Pharynx with mild erythema, no exudate Eyes: . Pupils are equal, round, and reactive to light. Conjunctivae and EOM are normal Nose: without d/c or deformity Neck: Neck supple. Gross normal ROM Cardiovascular: Normal rate and regular rhythm.   Pulmonary/Chest: Effort normal and breath sounds without rales or wheezing.  Neurological: Pt is alert. At baseline orientation, motor grossly intact Skin: Skin is warm. No rashes, other new lesions, no LE edema Psychiatric: Pt behavior is normal without agitation  No other exam findings Lab Results  Component Value Date   WBC 7.3 10/07/2017   HGB 13.9 10/07/2017   HCT 41.6 10/07/2017   PLT 243.0 10/07/2017   GLUCOSE 86 10/07/2017   CHOL 133 10/07/2017   TRIG 130.0 10/07/2017   HDL 43.10 10/07/2017   LDLDIRECT 117.5 07/06/2011   LDLCALC 64 10/07/2017   ALT 10 10/07/2017   AST 12 10/07/2017   NA 138 10/07/2017   K 4.3 10/07/2017   CL 105 10/07/2017   CREATININE 0.86 10/07/2017   BUN 13 10/07/2017   CO2 27 10/07/2017   TSH 1.60 10/07/2017   INR 0.87 05/12/2012   \    Assessment & Plan:

## 2018-09-06 NOTE — Assessment & Plan Note (Signed)
Mild to mod, for antibx course,  to f/u any worsening symptoms or concerns 

## 2018-09-06 NOTE — Patient Instructions (Signed)
Please take all new medication as prescribed - the antibiotic, and cough medicine as needed  Please continue all other medications as before, and refills have been done if requested.  Please have the pharmacy call with any other refills you may need.  Please keep your appointments with your specialists as you may have planned   

## 2018-09-06 NOTE — Assessment & Plan Note (Signed)
stable overall by history and exam, recent data reviewed with pt, and pt to continue medical treatment as before,  to f/u any worsening symptoms or concerns  

## 2018-09-06 NOTE — Assessment & Plan Note (Signed)
stable overall by history and exam, and pt to continue medical treatment as before,  to f/u any worsening symptoms or concerns 

## 2018-10-04 ENCOUNTER — Other Ambulatory Visit: Payer: Self-pay | Admitting: Internal Medicine

## 2018-10-12 ENCOUNTER — Ambulatory Visit (INDEPENDENT_AMBULATORY_CARE_PROVIDER_SITE_OTHER): Payer: BC Managed Care – PPO | Admitting: Internal Medicine

## 2018-10-12 ENCOUNTER — Other Ambulatory Visit (INDEPENDENT_AMBULATORY_CARE_PROVIDER_SITE_OTHER): Payer: BC Managed Care – PPO

## 2018-10-12 ENCOUNTER — Ambulatory Visit: Payer: BC Managed Care – PPO | Admitting: Obstetrics and Gynecology

## 2018-10-12 ENCOUNTER — Encounter: Payer: Self-pay | Admitting: Internal Medicine

## 2018-10-12 VITALS — BP 122/84 | HR 91 | Temp 98.4°F | Resp 16 | Ht 68.5 in | Wt 243.0 lb

## 2018-10-12 DIAGNOSIS — Z23 Encounter for immunization: Secondary | ICD-10-CM | POA: Diagnosis not present

## 2018-10-12 DIAGNOSIS — D6851 Activated protein C resistance: Secondary | ICD-10-CM

## 2018-10-12 DIAGNOSIS — Z0000 Encounter for general adult medical examination without abnormal findings: Secondary | ICD-10-CM

## 2018-10-12 LAB — CBC WITH DIFFERENTIAL/PLATELET
BASOS ABS: 0.1 10*3/uL (ref 0.0–0.1)
Basophils Relative: 0.9 % (ref 0.0–3.0)
Eosinophils Absolute: 0.2 10*3/uL (ref 0.0–0.7)
Eosinophils Relative: 2.4 % (ref 0.0–5.0)
HEMATOCRIT: 42.7 % (ref 36.0–46.0)
Hemoglobin: 14.6 g/dL (ref 12.0–15.0)
LYMPHS ABS: 1.3 10*3/uL (ref 0.7–4.0)
LYMPHS PCT: 19.3 % (ref 12.0–46.0)
MCHC: 34.1 g/dL (ref 30.0–36.0)
MCV: 86.7 fl (ref 78.0–100.0)
MONOS PCT: 8.6 % (ref 3.0–12.0)
Monocytes Absolute: 0.6 10*3/uL (ref 0.1–1.0)
NEUTROS ABS: 4.5 10*3/uL (ref 1.4–7.7)
NEUTROS PCT: 68.8 % (ref 43.0–77.0)
PLATELETS: 250 10*3/uL (ref 150.0–400.0)
RBC: 4.92 Mil/uL (ref 3.87–5.11)
RDW: 13.4 % (ref 11.5–15.5)
WBC: 6.5 10*3/uL (ref 4.0–10.5)

## 2018-10-12 LAB — BASIC METABOLIC PANEL
BUN: 11 mg/dL (ref 6–23)
CALCIUM: 9.6 mg/dL (ref 8.4–10.5)
CHLORIDE: 106 meq/L (ref 96–112)
CO2: 26 meq/L (ref 19–32)
Creatinine, Ser: 0.87 mg/dL (ref 0.40–1.20)
GFR: 75.85 mL/min (ref >60.00)
Glucose, Bld: 101 mg/dL — ABNORMAL HIGH (ref 70–99)
Potassium: 4.7 mEq/L (ref 3.5–5.1)
SODIUM: 139 meq/L (ref 135–145)

## 2018-10-12 LAB — URINALYSIS, ROUTINE W REFLEX MICROSCOPIC
Bilirubin Urine: NEGATIVE
Ketones, ur: NEGATIVE
Leukocytes, UA: NEGATIVE
NITRITE: NEGATIVE
PH: 7 (ref 5.0–8.0)
Specific Gravity, Urine: 1.01 (ref 1.000–1.030)
TOTAL PROTEIN, URINE-UPE24: NEGATIVE
URINE GLUCOSE: NEGATIVE
Urobilinogen, UA: 0.2 (ref 0.0–1.0)

## 2018-10-12 LAB — HEPATIC FUNCTION PANEL
ALBUMIN: 4.4 g/dL (ref 3.5–5.2)
ALK PHOS: 100 U/L (ref 39–117)
ALT: 13 U/L (ref 0–35)
AST: 14 U/L (ref 0–37)
Bilirubin, Direct: 0.1 mg/dL (ref 0.0–0.3)
TOTAL PROTEIN: 7.1 g/dL (ref 6.0–8.3)
Total Bilirubin: 0.6 mg/dL (ref 0.2–1.2)

## 2018-10-12 LAB — LIPID PANEL
CHOL/HDL RATIO: 3
Cholesterol: 154 mg/dL (ref 0–200)
HDL: 47.3 mg/dL (ref >39.00)
LDL Cholesterol: 83 mg/dL (ref 0–99)
NONHDL: 106.76
Triglycerides: 119 mg/dL (ref 0.0–149.0)
VLDL: 23.8 mg/dL (ref 0.0–40.0)

## 2018-10-12 LAB — TSH: TSH: 1.52 u[IU]/mL (ref 0.35–4.50)

## 2018-10-12 MED ORDER — ASPIRIN 81 MG PO TBEC: 81.0000 mg | DELAYED_RELEASE_TABLET | Freq: Every day | ORAL | 12 refills | Status: DC

## 2018-10-12 NOTE — Patient Instructions (Addendum)

## 2018-10-12 NOTE — Assessment & Plan Note (Signed)
To cont asa 81 qd

## 2018-10-12 NOTE — Progress Notes (Signed)
Subjective:    Patient ID: Robin Ayers, female    DOB: 01/12/1976, 42 y.o.   MRN: [id number]  HPI  Here to f/u; overall doing ok,  Pt denies chest pain, increasing sob or doe, wheezing, orthopnea, PND, increased LE swelling, palpitations, dizziness or syncope.  Pt denies new neurological symptoms such as new headache, or facial or extremity weakness or numbness.  Pt denies polydipsia, polyuria, or low sugar episode.  Pt states overall good compliance with meds, mostly trying to follow appropriate diet, with wt overall stable,  but little exercise however.  Due for appt with GYN in December, then yearly renal u/s per urology in jan 2020.  Recent hematuria eval neg. Denies urinary symptoms such as dysuria, frequency, urgency, flank pain, hematuria or n/v, fever, chills. Wt Readings from Last 3 Encounters:  10/12/18 243 lb (110.2 kg)  09/06/18 239 lb (108.4 kg)  01/06/18 242 lb (109.8 kg)   Past Medical History:  Diagnosis Date  . ALLERGIC RHINITIS 10/26/2007  . ASTHMA 04/10/2010  . Blood dyscrasia    factor V Leiden   . Clotting disorder ([place])   . DYSLIPIDEMIA 06/24/2009  . Factor V Leiden mutation ([place])    hx of  . Family history of anesthesia complication    PONV  . Ovarian cyst   . PONV (postoperative nausea and vomiting)    Past Surgical History:  Procedure Laterality Date  . ABDOMINAL HYSTERECTOMY    . BLADDER SUSPENSION  05/26/2012   Procedure: TRANSVAGINAL TAPE (TVT) PROCEDURE;  Surgeon: Daria Pastures, MD;  Location: [facility];  Service: Gynecology;  Laterality: N/A;  . CYSTOCELE REPAIR  05/26/2012   Procedure: ANTERIOR REPAIR (CYSTOCELE);  Surgeon: Daria Pastures, MD;  Location: [facility];  Service: Gynecology;  Laterality: N/A;  . CYSTOSCOPY  05/26/2012   Procedure: CYSTOSCOPY;  Surgeon: Daria Pastures, MD;  Location: [facility];  Service: Gynecology;  Laterality: N/A;  . LAPAROSCOPIC ASSISTED VAGINAL HYSTERECTOMY  05/26/2012   Procedure: LAPAROSCOPIC ASSISTED  VAGINAL HYSTERECTOMY;  Surgeon: Gus Height, MD;  Location: [facility];  Service: Gynecology;  Laterality: N/A;  . SALPINGOOPHORECTOMY  05/26/2012   Procedure: SALPINGO OOPHERECTOMY;  Surgeon: Gus Height, MD;  Location: [facility];  Service: Gynecology;  Laterality: Left;  . TONSILLECTOMY AND ADENOIDECTOMY    . WISDOM TOOTH EXTRACTION      reports that she has quit smoking. She has never used smokeless tobacco. She reports that she does not drink alcohol or use drugs. family history includes Diabetes in her mother and other; Heart attack (age of onset: 32) in her mother; Heart disease in her maternal grandfather and mother; Hypertension in her maternal grandfather, mother, and other. Allergies  Allergen Reactions  . Clarithromycin     REACTION: GI upset  . Penicillins     REACTION: rash   Current Outpatient Medications on File Prior to Visit  Medication Sig Dispense Refill  . albuterol (PROVENTIL HFA;VENTOLIN HFA) 108 (90 Base) MCG/ACT inhaler Inhale 2 puffs into the lungs every 6 (six) hours as needed for wheezing or shortness of breath. 3 Inhaler 3  . atorvastatin (LIPITOR) 40 MG tablet TAKE 1 TABLET BY MOUTH EVERY DAY 90 tablet 3  . cetirizine (ZYRTEC) 10 MG tablet Take 10 mg by mouth daily.    . montelukast (SINGULAIR) 10 MG tablet TAKE 1 TABLET BY MOUTH EVERYDAY AT BEDTIME 90 tablet 3  . Probiotic Product (PROBIOTIC PO) Take by mouth daily.    . SYMBICORT 160-4.5 MCG/ACT inhaler TAKE  2 PUFFS BY MOUTH TWICE A DAY 10.2 Inhaler 12   No current facility-administered medications on file prior to visit.    Review of Systems  Constitutional: Negative for other unusual diaphoresis or sweats HENT: Negative for ear discharge or swelling Eyes: Negative for other worsening visual disturbances Respiratory: Negative for stridor or other swelling  Gastrointestinal: Negative for worsening distension or other blood Genitourinary: Negative for retention or other urinary change Musculoskeletal: Negative for  other MSK pain or swelling Skin: Negative for color change or other new lesions Neurological: Negative for worsening tremors and other numbness  Psychiatric/Behavioral: Negative for worsening agitation or other fatigue All other system neg per pt    Objective:   Physical Exam BP 122/84   Pulse 91   Temp 98.4 F (36.9 C) (Oral)   Resp 16   Ht 5' 8.5" (1.74 m)   Wt 243 lb (110.2 kg)   LMP 04/16/2012   SpO2 98%   BMI 36.41 kg/m  VS noted,  Constitutional: Pt appears in NAD HENT: Head: NCAT.  Right Ear: External ear normal.  Left Ear: External ear normal.  Eyes: . Pupils are equal, round, and reactive to light. Conjunctivae and EOM are normal Nose: without d/c or deformity Neck: Neck supple. Gross normal ROM Cardiovascular: Normal rate and regular rhythm.   Pulmonary/Chest: Effort normal and breath sounds without rales or wheezing.  Abd:  Soft, NT, ND, + BS, no organomegaly Neurological: Pt is alert. At baseline orientation, motor grossly intact Skin: Skin is warm. No rashes, other new lesions, no LE edema Psychiatric: Pt behavior is normal without agitation  No other exam findings Lab Results  Component Value Date   WBC 6.5 10/12/2018   HGB 14.6 10/12/2018   HCT 42.7 10/12/2018   PLT 250.0 10/12/2018   GLUCOSE 101 (H) 10/12/2018   CHOL 154 10/12/2018   TRIG 119.0 10/12/2018   HDL 47.30 10/12/2018   LDLDIRECT 117.5 07/06/2011   LDLCALC 83 10/12/2018   ALT 13 10/12/2018   AST 14 10/12/2018   NA 139 10/12/2018   K 4.7 10/12/2018   CL 106 10/12/2018   CREATININE 0.87 10/12/2018   BUN 11 10/12/2018   CO2 26 10/12/2018   TSH 1.52 10/12/2018   INR 0.87 05/12/2012       Assessment & Plan:

## 2018-12-01 ENCOUNTER — Ambulatory Visit (INDEPENDENT_AMBULATORY_CARE_PROVIDER_SITE_OTHER): Payer: BC Managed Care – PPO | Admitting: Obstetrics and Gynecology

## 2018-12-01 ENCOUNTER — Other Ambulatory Visit: Payer: Self-pay

## 2018-12-01 ENCOUNTER — Encounter: Payer: Self-pay | Admitting: Obstetrics and Gynecology

## 2018-12-01 VITALS — BP 114/82 | HR 80 | Ht 68.5 in | Wt 244.4 lb

## 2018-12-01 DIAGNOSIS — Z01419 Encounter for gynecological examination (general) (routine) without abnormal findings: Secondary | ICD-10-CM

## 2018-12-01 NOTE — Progress Notes (Signed)
42 y.o. [id number] Married White or Caucasian Not Hispanic or Latino female here for annual exam.  H/O LAVH/LSO in 2013. H/O DVT at 81, +factor V Leinden.  No dyspareunia. No bowel issues. She sees a Dealer for small blood in her urine, is due for f/u in 1/20.    Patient's last menstrual period was 04/16/2012.          Sexually active: Yes.    The current method of family planning is status post hysterectomy.    Exercising: Yes.    walking, light weights Smoker:  no  Health Maintenance: Pap:  05/2012 WNL hysterectomy  History of abnormal Pap:  Yes- years ago- early 25s, had a colposcopy, no surgery on her cervix.  MMG:  08/04/2018 Birads 1 negative Colonoscopy:  Never BMD:   Never TDaP:  10/04/2017 Gardasil: No   reports that she has quit smoking. She has never used smokeless tobacco. She reports that she does not drink alcohol or use drugs. 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   Past Medical History:  Diagnosis Date  . ALLERGIC RHINITIS 10/26/2007  . ASTHMA 04/10/2010  . Blood dyscrasia    factor V Leiden   . Clotting disorder ([place])   . DYSLIPIDEMIA 06/24/2009  . Factor V Leiden mutation ([place])    hx of  . Family history of anesthesia complication    PONV  . Ovarian cyst   . PONV (postoperative nausea and vomiting)     Past Surgical History:  Procedure Laterality Date  . ABDOMINAL HYSTERECTOMY    . BLADDER SUSPENSION  05/26/2012   Procedure: TRANSVAGINAL TAPE (TVT) PROCEDURE;  Surgeon: Daria Pastures, MD;  Location: [facility];  Service: Gynecology;  Laterality: N/A;  . CYSTOCELE REPAIR  05/26/2012   Procedure: ANTERIOR REPAIR (CYSTOCELE);  Surgeon: Daria Pastures, MD;  Location: [facility];  Service: Gynecology;  Laterality: N/A;  . CYSTOSCOPY  05/26/2012   Procedure: CYSTOSCOPY;  Surgeon: Daria Pastures, MD;  Location: [facility];  Service: Gynecology;  Laterality: N/A;  . LAPAROSCOPIC ASSISTED VAGINAL HYSTERECTOMY  05/26/2012   Procedure: LAPAROSCOPIC ASSISTED VAGINAL  HYSTERECTOMY;  Surgeon: Gus Height, MD;  Location: [facility];  Service: Gynecology;  Laterality: N/A;  . SALPINGOOPHORECTOMY  05/26/2012   Procedure: SALPINGO OOPHERECTOMY;  Surgeon: Gus Height, MD;  Location: [facility];  Service: Gynecology;  Laterality: Left;  . TONSILLECTOMY AND ADENOIDECTOMY    . WISDOM TOOTH EXTRACTION      Current Outpatient Medications  Medication Sig Dispense Refill  . albuterol (PROVENTIL HFA;VENTOLIN HFA) 108 (90 Base) MCG/ACT inhaler Inhale 2 puffs into the lungs every 6 (six) hours as needed for wheezing or shortness of breath. 3 Inhaler 3  . aspirin 81 MG EC tablet Take 1 tablet (81 mg total) by mouth daily. Swallow whole. 30 tablet 12  . atorvastatin (LIPITOR) 40 MG tablet TAKE 1 TABLET BY MOUTH EVERY DAY 90 tablet 3  . cetirizine (ZYRTEC) 10 MG tablet Take 10 mg by mouth daily.    . montelukast (SINGULAIR) 10 MG tablet TAKE 1 TABLET BY MOUTH EVERYDAY AT BEDTIME 90 tablet 3  . Multiple Vitamin (MULTIVITAMIN) tablet Take 1 tablet by mouth daily.    . Probiotic Product (PROBIOTIC PO) Take by mouth daily.    . SYMBICORT 160-4.5 MCG/ACT inhaler TAKE 2 PUFFS BY MOUTH TWICE A DAY 10.2 Inhaler 12   No current facility-administered medications for this visit.     Family History  Problem Relation Age of Onset  .  Heart disease Mother   . Heart attack Mother 10  . Diabetes Mother   . Hypertension Mother   . Diabetes Other   . Hypertension Other   . Hypertension Maternal Grandfather   . Heart disease Maternal Grandfather   . Breast cancer Neg Hx     Review of Systems  Constitutional: Negative.   HENT: Negative.   Eyes: Negative.   Respiratory: Negative.   Cardiovascular: Negative.   Gastrointestinal: Negative.   Endocrine: Negative.   Genitourinary: Negative.   Musculoskeletal: Negative.   Skin: Negative.   Allergic/Immunologic: Negative.   Neurological: Negative.   Hematological: Negative.   Psychiatric/Behavioral: Negative.     Exam:   BP 114/82 (BP  Location: Right Arm, Patient Position: Sitting, Cuff Size: Large)   Pulse 80   Ht 5' 8.5" (1.74 m)   Wt 244 lb 6.4 oz (110.9 kg)   LMP 04/16/2012   BMI 36.62 kg/m   Weight change: @WEIGHTCHANGE @ Height:   Height: 5' 8.5" (174 cm)  Ht Readings from Last 3 Encounters:  12/01/18 5' 8.5" (1.74 m)  10/12/18 5' 8.5" (1.74 m)  09/06/18 5' 8.5" (1.74 m)    General appearance: alert, cooperative and appears stated age Head: Normocephalic, without obvious abnormality, atraumatic Neck: no adenopathy, supple, symmetrical, trachea midline and thyroid normal to inspection and palpation Lungs: clear to auscultation bilaterally Cardiovascular: regular rate and rhythm Breasts: normal appearance, no masses or tenderness Abdomen: soft, non-tender; non distended,  no masses,  no organomegaly Extremities: extremities normal, atraumatic, no cyanosis or edema Skin: Skin color, texture, turgor normal. No rashes or lesions Lymph nodes: Cervical, supraclavicular, and axillary nodes normal. No abnormal inguinal nodes palpated Neurologic: Grossly normal   Pelvic: External genitalia:  no lesions              Urethra:  normal appearing urethra with no masses, tenderness or lesions              Bartholins and Skenes: normal                 Vagina: normal appearing vagina with normal color and discharge, no lesions              Cervix: absent               Bimanual Exam:  Uterus:  uterus absent              Adnexa: no mass, fullness, tenderness               Rectovaginal: Confirms               Anus:  normal sphincter tone, no lesions  Chaperone was present for exam.  A:  Well Woman with normal exam  H/O hysterectomy  P:   No pap's  Discussed breast self exam  Discussed calcium and vit D intake  Mammogram UTD  Labs with primary

## 2018-12-01 NOTE — Patient Instructions (Signed)

## 2019-02-01 ENCOUNTER — Other Ambulatory Visit: Payer: Self-pay | Admitting: Internal Medicine

## 2019-05-03 ENCOUNTER — Other Ambulatory Visit: Payer: Self-pay

## 2019-05-03 ENCOUNTER — Encounter: Payer: Self-pay | Admitting: Family

## 2019-05-03 ENCOUNTER — Ambulatory Visit (INDEPENDENT_AMBULATORY_CARE_PROVIDER_SITE_OTHER): Payer: BC Managed Care – PPO | Admitting: Family

## 2019-05-03 DIAGNOSIS — J019 Acute sinusitis, unspecified: Secondary | ICD-10-CM

## 2019-05-03 MED ORDER — PREDNISONE 20 MG PO TABS: 20.0000 mg | ORAL_TABLET | Freq: Every day | ORAL | 0 refills | Status: DC

## 2019-05-03 MED ORDER — DOXYCYCLINE HYCLATE 100 MG PO TABS: 100.0000 mg | ORAL_TABLET | Freq: Two times a day (BID) | ORAL | 0 refills | Status: DC

## 2019-05-03 NOTE — Progress Notes (Signed)
Robin Ayers is a 43 y.o. female with the following history as recorded in EpicCare:  Patient Active Problem List   Diagnosis Date Noted  . Upper respiratory infection 01/06/2018  . Cough 12/30/2017  . Wheezing 12/30/2017  . Rash and nonspecific skin eruption 05/27/2016  . Factor V Leiden mutation ([place])   . Preventative health care 07/06/2011  . Asthma 04/10/2010  . Tenosynovitis of foot and ankle 10/11/2009  . Hyperlipidemia 06/24/2009  . HAND PAIN, BILATERAL 06/24/2009  . FATIGUE 06/24/2009  . URINARY INCONTINENCE 06/24/2009  . Allergic rhinitis 10/26/2007    Current Outpatient Medications  Medication Sig Dispense Refill  . aspirin 81 MG EC tablet Take 1 tablet (81 mg total) by mouth daily. Swallow whole. 30 tablet 12  . atorvastatin (LIPITOR) 40 MG tablet TAKE 1 TABLET BY MOUTH EVERY DAY 90 tablet 3  . cetirizine (ZYRTEC) 10 MG tablet Take 10 mg by mouth daily.    Marland Kitchen doxycycline (VIBRA-TABS) 100 MG tablet Take 1 tablet (100 mg total) by mouth 2 (two) times daily. 20 tablet 0  . montelukast (SINGULAIR) 10 MG tablet TAKE 1 TABLET BY MOUTH EVERYDAY AT BEDTIME 90 tablet 3  . Multiple Vitamin (MULTIVITAMIN) tablet Take 1 tablet by mouth daily.    . predniSONE (DELTASONE) 20 MG tablet Take 1 tablet (20 mg total) by mouth daily with breakfast. 5 tablet 0  . PROAIR HFA 108 (90 Base) MCG/ACT inhaler TAKE 2 PUFFS BY MOUTH EVERY 6 HOURS AS NEEDED FOR WHEEZE OR SHORTNESS OF BREATH 25.5 Inhaler 3  . Probiotic Product (PROBIOTIC PO) Take by mouth daily.    . SYMBICORT 160-4.5 MCG/ACT inhaler TAKE 2 PUFFS BY MOUTH TWICE A DAY 10.2 Inhaler 12   No current facility-administered medications for this visit.     Allergies: Clarithromycin and Penicillins  Past Medical History:  Diagnosis Date  . ALLERGIC RHINITIS 10/26/2007  . ASTHMA 04/10/2010  . Blood dyscrasia    factor V Leiden   . Clotting disorder ([place])   . DYSLIPIDEMIA 06/24/2009  . Factor V Leiden mutation ([place])    hx of  .  Family history of anesthesia complication    PONV  . Ovarian cyst   . PONV (postoperative nausea and vomiting)     Past Surgical History:  Procedure Laterality Date  . ABDOMINAL HYSTERECTOMY    . BLADDER SUSPENSION  05/26/2012   Procedure: TRANSVAGINAL TAPE (TVT) PROCEDURE;  Surgeon: Daria Pastures, MD;  Location: [facility];  Service: Gynecology;  Laterality: N/A;  . CYSTOCELE REPAIR  05/26/2012   Procedure: ANTERIOR REPAIR (CYSTOCELE);  Surgeon: Daria Pastures, MD;  Location: [facility];  Service: Gynecology;  Laterality: N/A;  . CYSTOSCOPY  05/26/2012   Procedure: CYSTOSCOPY;  Surgeon: Daria Pastures, MD;  Location: [facility];  Service: Gynecology;  Laterality: N/A;  . LAPAROSCOPIC ASSISTED VAGINAL HYSTERECTOMY  05/26/2012   Procedure: LAPAROSCOPIC ASSISTED VAGINAL HYSTERECTOMY;  Surgeon: Gus Height, MD;  Location: [facility];  Service: Gynecology;  Laterality: N/A;  . SALPINGOOPHORECTOMY  05/26/2012   Procedure: SALPINGO OOPHERECTOMY;  Surgeon: Gus Height, MD;  Location: [facility];  Service: Gynecology;  Laterality: Left;  . TONSILLECTOMY AND ADENOIDECTOMY    . WISDOM TOOTH EXTRACTION      Family History  Problem Relation Age of Onset  . Heart disease Mother   . Heart attack Mother 77  . Diabetes Mother   . Hypertension Mother   . Diabetes Other   . Hypertension Other   . Hypertension Maternal Grandfather   .  Heart disease Maternal Grandfather   . Breast cancer Neg Hx     Social History   Tobacco Use  . Smoking status: Former Research scientist (life sciences)  . Smokeless tobacco: Never Used  Substance Use Topics  . Alcohol use: No    Subjective:    I connected with Robin Ayers on 05/03/19 at 10:40 AM EDT by a video enabled telemedicine application and verified that I am speaking with the correct person using two identifiers. Patient and I are the only 2 people on the video call.    I discussed the limitations of evaluation and management by telemedicine and the availability of in person  appointments. The patient expressed understanding and agreed to proceed.   2 week history of sinus pain/ pressure; + frontal headache; has started to run a fever in the past 3 days- temperature has been as high as 102/ at time of OV, temperature is at 97; denies any cough, chest pain or shortness of breath; does have underlying asthma- has not had to use rescue inhaler; limited possible exposure to COVID- has only been to the grocery store; no body aches; is prone to recurrent sinus infections;     Objective:  There were no vitals filed for this visit.  General: Well developed, well nourished, in no acute distress   Head: Normocephalic and atraumatic  Eyes: Sclera and conjunctiva clear; pupils round and reactive to light; extraocular movements intact  Ears: External normal; canals clear; tympanic membranes normal  Oropharynx: Pink, supple. No suspicious lesions  Neck: Supple without thyromegaly, adenopathy  Lungs: Respirations unlabored;  Neurologic: Alert and oriented; speech intact; face symmetrical;    Assessment:  1. Acute sinusitis, recurrence not specified, unspecified location     Plan:  Will go ahead and treat for suspect sinus infection as patient is prone to sinus infection and symptoms have been present x 2 weeks; will use combination of Doxycycline and Prednisone which she has done well on in the past; if no improvement by Friday of this week, patient to call back and will need to send her for COVID testing; asked patient to try and continue to stay at home as much as possible; increase fluids, rest and follow-up worse, no better.   No follow-ups on file.  No orders of the defined types were placed in this encounter.   Requested Prescriptions   Signed Prescriptions Disp Refills  . doxycycline (VIBRA-TABS) 100 MG tablet 20 tablet 0    Sig: Take 1 tablet (100 mg total) by mouth 2 (two) times daily.  . predniSONE (DELTASONE) 20 MG tablet 5 tablet 0    Sig: Take 1 tablet (20  mg total) by mouth daily with breakfast.

## 2019-08-03 ENCOUNTER — Other Ambulatory Visit: Payer: Self-pay

## 2019-08-03 MED ORDER — BUDESONIDE-FORMOTEROL FUMARATE 160-4.5 MCG/ACT IN AERO: INHALATION_SPRAY | RESPIRATORY_TRACT | 12 refills | Status: DC

## 2019-10-01 ENCOUNTER — Other Ambulatory Visit: Payer: Self-pay | Admitting: Internal Medicine

## 2019-11-07 ENCOUNTER — Other Ambulatory Visit: Payer: Self-pay | Admitting: Internal Medicine

## 2019-11-07 DIAGNOSIS — Z1231 Encounter for screening mammogram for malignant neoplasm of breast: Secondary | ICD-10-CM

## 2019-11-08 ENCOUNTER — Other Ambulatory Visit: Payer: Self-pay

## 2019-11-08 ENCOUNTER — Ambulatory Visit
Admission: RE | Admit: 2019-11-08 | Discharge: 2019-11-08 | Disposition: A | Discharge status: Home / Self Care | Payer: BC Managed Care – PPO | Source: Ambulatory Visit | Attending: Internal Medicine | Admitting: Internal Medicine

## 2019-11-08 DIAGNOSIS — Z1231 Encounter for screening mammogram for malignant neoplasm of breast: Secondary | ICD-10-CM

## 2019-12-14 ENCOUNTER — Ambulatory Visit (INDEPENDENT_AMBULATORY_CARE_PROVIDER_SITE_OTHER): Payer: BC Managed Care – PPO | Admitting: Obstetrics and Gynecology

## 2019-12-14 ENCOUNTER — Encounter: Payer: Self-pay | Admitting: Obstetrics and Gynecology

## 2019-12-14 ENCOUNTER — Other Ambulatory Visit: Payer: Self-pay

## 2019-12-14 VITALS — BP 125/75 | HR 80 | Temp 97.5°F | Ht 69.0 in | Wt 246.6 lb

## 2019-12-14 DIAGNOSIS — N816 Rectocele: Secondary | ICD-10-CM

## 2019-12-14 DIAGNOSIS — Z01419 Encounter for gynecological examination (general) (routine) without abnormal findings: Secondary | ICD-10-CM | POA: Diagnosis not present

## 2019-12-14 NOTE — Progress Notes (Signed)
44 y.o. [id number] Married White or Caucasian Not Hispanic or Latino female here for annual exam.  H/o LAVH/LSO in 2013. H/O DVT at 49, +factor V Leiden  No concerns. Sexually active, no pain. .  In 1/20 she was seen by Urology for hematuria, negative evaluation. 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     Patient's last menstrual period was 04/16/2012.          Sexually active: Yes.    The current method of family planning is status post hysterectomy.    Exercising: No.  The patient does not participate in regular exercise at present. Smoker:  no  Health Maintenance: Pap:  05/2012 WNL hysterectomy, vaginal pap 2018, negative. History of abnormal Pap:  Yes- years ago- early 35s, had a colposcopy, no surgery on her cervix. MMG:  11/08/19 Density C Bi-rads 1 Neg  BMD:  none Colonoscopy: none TDaP:  10/04/17 Gardasil: NA   reports that she has quit smoking. She has never used smokeless tobacco. She reports that she does not drink alcohol or use drugs. 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 Daughters are 43 and 10.  Past Medical History:  Diagnosis Date  . ALLERGIC RHINITIS 10/26/2007  . ASTHMA 04/10/2010  . Blood dyscrasia    factor V Leiden   . Clotting disorder ([place])   . DYSLIPIDEMIA 06/24/2009  . Factor V Leiden mutation ([place])    hx of  . Family history of anesthesia complication    PONV  . Ovarian cyst   . PONV (postoperative nausea and vomiting)     Past Surgical History:  Procedure Laterality Date  . ABDOMINAL HYSTERECTOMY    . BLADDER SUSPENSION  05/26/2012   Procedure: TRANSVAGINAL TAPE (TVT) PROCEDURE;  Surgeon: Daria Pastures, MD;  Location: [facility];  Service: Gynecology;  Laterality: N/A;  . CYSTOCELE REPAIR  05/26/2012   Procedure: ANTERIOR REPAIR (CYSTOCELE);  Surgeon: Daria Pastures, MD;  Location: [facility];  Service: Gynecology;  Laterality: N/A;  . CYSTOSCOPY  05/26/2012   Procedure: CYSTOSCOPY;  Surgeon: Daria Pastures, MD;  Location:  [facility];  Service: Gynecology;  Laterality: N/A;  . LAPAROSCOPIC ASSISTED VAGINAL HYSTERECTOMY  05/26/2012   Procedure: LAPAROSCOPIC ASSISTED VAGINAL HYSTERECTOMY;  Surgeon: Gus Height, MD;  Location: [facility];  Service: Gynecology;  Laterality: N/A;  . SALPINGOOPHORECTOMY  05/26/2012   Procedure: SALPINGO OOPHERECTOMY;  Surgeon: Gus Height, MD;  Location: [facility];  Service: Gynecology;  Laterality: Left;  . TONSILLECTOMY AND ADENOIDECTOMY    . WISDOM TOOTH EXTRACTION      Current Outpatient Medications  Medication Sig Dispense Refill  . aspirin 81 MG EC tablet Take 1 tablet (81 mg total) by mouth daily. Swallow whole. 30 tablet 12  . atorvastatin (LIPITOR) 40 MG tablet TAKE 1 TABLET BY MOUTH EVERY DAY 90 tablet 0  . budesonide-formoterol (SYMBICORT) 160-4.5 MCG/ACT inhaler TAKE 2 PUFFS BY MOUTH TWICE A DAY 10.2 Inhaler 12  . cetirizine (ZYRTEC) 10 MG tablet Take 10 mg by mouth daily.    Marland Kitchen doxycycline (VIBRA-TABS) 100 MG tablet Take 1 tablet (100 mg total) by mouth 2 (two) times daily. 20 tablet 0  . montelukast (SINGULAIR) 10 MG tablet TAKE 1 TABLET BY MOUTH EVERYDAY AT BEDTIME 90 tablet 0  . Multiple Vitamin (MULTIVITAMIN) tablet Take 1 tablet by mouth daily.    . predniSONE (DELTASONE) 20 MG tablet Take 1 tablet (20 mg total) by mouth daily with breakfast. 5 tablet 0  . PROAIR HFA 108 (90 Base)  MCG/ACT inhaler TAKE 2 PUFFS BY MOUTH EVERY 6 HOURS AS NEEDED FOR WHEEZE OR SHORTNESS OF BREATH 25.5 Inhaler 3  . Probiotic Product (PROBIOTIC PO) Take by mouth daily.     No current facility-administered medications for this visit.    Family History  Problem Relation Age of Onset  . Heart disease Mother   . Heart attack Mother 47  . Diabetes Mother   . Hypertension Mother   . Diabetes Other   . Hypertension Other   . Hypertension Maternal Grandfather   . Heart disease Maternal Grandfather   . Breast cancer Neg Hx     Review of Systems  All other systems reviewed and are  negative.   Exam:   LMP 04/16/2012   Weight change: @WEIGHTCHANGE @ Height:      Ht Readings from Last 3 Encounters:  12/01/18 5' 8.5" (1.74 m)  10/12/18 5' 8.5" (1.74 m)  09/06/18 5' 8.5" (1.74 m)    General appearance: alert, cooperative and appears stated age Head: Normocephalic, without obvious abnormality, atraumatic Neck: no adenopathy, supple, symmetrical, trachea midline and thyroid normal to inspection and palpation Lungs: clear to auscultation bilaterally Cardiovascular: regular rate and rhythm Breasts: normal appearance, no masses or tenderness Abdomen: soft, non-tender; non distended,  no masses,  no organomegaly Extremities: extremities normal, atraumatic, no cyanosis or edema Skin: Skin color, texture, turgor normal. No rashes or lesions Lymph nodes: Cervical, supraclavicular, and axillary nodes normal. No abnormal inguinal nodes palpated Neurologic: Grossly normal   Pelvic: External genitalia:  no lesions              Urethra:  normal appearing urethra with no masses, tenderness or lesions              Bartholins and Skenes: normal                 Vagina: normal appearing vagina with normal color and discharge, no lesions  Grade 1-2 rectocele, no other significant prolapse              Cervix: absent               Bimanual Exam:  Uterus:  uterus absent              Adnexa: no mass, fullness, tenderness               Rectovaginal: Confirms               Anus:  normal sphincter tone, no lesions  Terence Lux chaperoned for the exam.  A:  Well Woman with normal exam  H/O hysterectomy  H/O factor V Leiden and DVT  Grade 1-2 rectocele, not symptomatic  P:   No pap needed  Mammogram UTD  Discussed breast self exam  Discussed calcium and vit D intake  Labs with primary  Discussed prolapse, information given. Avoid straining, heavy lifting if possible

## 2019-12-14 NOTE — Patient Instructions (Addendum)
EXERCISE AND DIET:  We recommended that you start or continue a regular exercise program for good health. Regular exercise means any activity that makes your heart beat faster and makes you sweat.  We recommend exercising at least 30 minutes per day at least 3 days a week, preferably 4 or 5.  We also recommend a diet low in fat and sugar.  Inactivity, poor dietary choices and obesity can cause diabetes, heart attack, stroke, and kidney damage, among others.   ° °ALCOHOL AND SMOKING:  Women should limit their alcohol intake to no more than 7 drinks/beers/glasses of wine (combined, not each!) per week. Moderation of alcohol intake to this level decreases your risk of breast cancer and liver damage. And of course, no recreational drugs are part of a healthy lifestyle.  And absolutely no smoking or even second hand smoke. Most people know smoking can cause heart and lung diseases, but did you know it also contributes to weakening of your bones? Aging of your skin?  Yellowing of your teeth and nails? ° °CALCIUM AND VITAMIN D:  Adequate intake of calcium and Vitamin D are recommended.  The recommendations for exact amounts of these supplements seem to change often, but generally speaking 1,000 mg of calcium (between diet and supplement) and 800 units of Vitamin D per day seems prudent. Certain women may benefit from higher intake of Vitamin D.  If you are among these women, your doctor will have told you during your visit.   ° °PAP SMEARS:  Pap smears, to check for cervical cancer or precancers,  have traditionally been done yearly, although recent scientific advances have shown that most women can have pap smears less often.  However, every woman still should have a physical exam from her gynecologist every year. It will include a breast check, inspection of the vulva and vagina to check for abnormal growths or skin changes, a visual exam of the cervix, and then an exam to evaluate the size and shape of the uterus and  ovaries.  And after 44 years of age, a rectal exam is indicated to check for rectal cancers. We will also provide age appropriate advice regarding health maintenance, like when you should have certain vaccines, screening for sexually transmitted diseases, bone density testing, colonoscopy, mammograms, etc.  ° °MAMMOGRAMS:  All women over 40 years old should have a yearly mammogram. Many facilities now offer a "3D" mammogram, which may cost around $50 extra out of pocket. If possible,  we recommend you accept the option to have the 3D mammogram performed.  It both reduces the number of women who will be called back for extra views which then turn out to be normal, and it is better than the routine mammogram at detecting truly abnormal areas.   ° °COLON CANCER SCREENING: Now recommend starting at age 45. At this time colonoscopy is not covered for routine screening until 50. There are take home tests that can be done between 45-49.  ° °COLONOSCOPY:  Colonoscopy to screen for colon cancer is recommended for all women at age 50.  We know, you hate the idea of the prep.  We agree, BUT, having colon cancer and not knowing it is worse!!  Colon cancer so often starts as a polyp that can be seen and removed at colonscopy, which can quite literally save your life!  And if your first colonoscopy is normal and you have no family history of colon cancer, most women don't have to have it again for   10 years.  Once every ten years, you can do something that may end up saving your life, right?  We will be happy to help you get it scheduled when you are ready.  Be sure to check your insurance coverage so you understand how much it will cost.  It may be covered as a preventative service at no cost, but you should check your particular policy.   ° ° ° °Breast Self-Awareness °Breast self-awareness means being familiar with how your breasts look and feel. It involves checking your breasts regularly and reporting any changes to your  health care provider. °Practicing breast self-awareness is important. A change in your breasts can be a sign of a serious medical problem. Being familiar with how your breasts look and feel allows you to find any problems early, when treatment is more likely to be successful. All women should practice breast self-awareness, including women who have had breast implants. °How to do a breast self-exam °One way to learn what is normal for your breasts and whether your breasts are changing is to do a breast self-exam. To do a breast self-exam: °Look for Changes ° °1. Remove all the clothing above your waist. °2. Stand in front of a mirror in a room with good lighting. °3. Put your hands on your hips. °4. Push your hands firmly downward. °5. Compare your breasts in the mirror. Look for differences between them (asymmetry), such as: °? Differences in shape. °? Differences in size. °? Puckers, dips, and bumps in one breast and not the other. °6. Look at each breast for changes in your skin, such as: °? Redness. °? Scaly areas. °7. Look for changes in your nipples, such as: °? Discharge. °? Bleeding. °? Dimpling. °? Redness. °? A change in position. °Feel for Changes °Carefully feel your breasts for lumps and changes. It is best to do this while lying on your back on the floor and again while sitting or standing in the shower or tub with soapy water on your skin. Feel each breast in the following way: °· Place the arm on the side of the breast you are examining above your head. °· Feel your breast with the other hand. °· Start in the nipple area and make ¾ inch (2 cm) overlapping circles to feel your breast. Use the pads of your three middle fingers to do this. Apply light pressure, then medium pressure, then firm pressure. The light pressure will allow you to feel the tissue closest to the skin. The medium pressure will allow you to feel the tissue that is a little deeper. The firm pressure will allow you to feel the tissue  close to the ribs. °· Continue the overlapping circles, moving downward over the breast until you feel your ribs below your breast. °· Move one finger-width toward the center of the body. Continue to use the ¾ inch (2 cm) overlapping circles to feel your breast as you move slowly up toward your collarbone. °· Continue the up and down exam using all three pressures until you reach your armpit. ° °Write Down What You Find ° °Write down what is normal for each breast and any changes that you find. Keep a written record with breast changes or normal findings for each breast. By writing this information down, you do not need to depend only on memory for size, tenderness, or location. Write down where you are in your menstrual cycle, if you are still menstruating. °If you are having trouble noticing differences   in your breasts, do not get discouraged. With time you will become more familiar with the variations in your breasts and more comfortable with the exam. °How often should I examine my breasts? °Examine your breasts every month. If you are breastfeeding, the best time to examine your breasts is after a feeding or after using a breast pump. If you menstruate, the best time to examine your breasts is 5-7 days after your period is over. During your period, your breasts are lumpier, and it may be more difficult to notice changes. °When should I see my health care provider? °See your health care provider if you notice: °· A change in shape or size of your breasts or nipples. °· A change in the skin of your breast or nipples, such as a reddened or scaly area. °· Unusual discharge from your nipples. °· A lump or thick area that was not there before. °· Pain in your breasts. °· Anything that concerns you. ° °About Rectocele ° °Overview ° °A rectocele is a type of hernia which causes different degrees of bulging of the rectal tissues into the vaginal wall.  You may even notice that it presses against the vaginal wall so much  that some vaginal tissues droop outside of the opening of your vagina. ° °Causes of Rectocele ° °The most common cause is childbirth.  The muscles and ligaments in the pelvis that hold up and support the female organs and vagina become stretched and weakened during labor and delivery.  The more babies you have, the more the support tissues are stretched and weakened.  Not everyone who has a baby will develop a rectocele.  Some women have stronger supporting tissue in the pelvis and may not have as much of a problem as others.  Women who have a Cesarean section usually do not get rectocele's unless they pushed a long time prior to the cesarean delivery. ° °Other conditions that can cause a rectocele include chronic constipation, a chronic cough, a lot of heavy lifting, and obesity.  Older women may have this problem because the loss of female hormones causes the vaginal tissue to become weaker. ° °Symptoms ° °There may not be any symptoms.  If you do have symptoms, they may include: °· Pelvic pressure in the rectal area °· Protrusion of the lower part of the vagina through the opening of the vagina °· Constipation and trapping of the stool, making it difficult to have a bowel movement.  In severe cases, you may have to press on the lower part of your vagina to help push the stool out of you rectum.  This is called splinting to empty. ° °Diagnosing Rectocele ° °Your health care provider will ask about your symptoms and perform a pelvic exam.  S/he will ask you to bear down, pushing like you are having a bowel movement so as to see how far the lower part of the vagina protrudes into the vagina and possible outside of the vagina.  Your provider will also ask you to contract the muscles of your pelvis (like you are stopping the stream in the middle of urinating) to determine the strength of your pelvic muscles.  Your provider may also do a rectal exam. ° °Treatment Options ° °If you do not have any symptoms, no treatment  may be necessary.  Other treatment options include: °· Pelvic floor exercises: Contracting the muscles in your genital area may help strengthen your muscles and support the organs.  Be sure to get   proper exercise instruction from you physical therapist. °· A pessary (removealbe pelvic support device) sometimes helps rectocele symptoms. °· Surgery: Surgical repair may be necessary. In some cases the uterus may need to be taken out ( a hysterectomy) as well.  There are many types of surgery for pelvic support problems.  Look for physicians who specialize in repair procedures. ° °You can take care of yourself by: °· Treating and preventing constipation °· Avoiding heavy lifting, and lifting correctly (with your legs, not with you waist or back) °· Treating a chronic cough or bronchitis °· Not smoking °· avoiding too much weight gain °· Doing pelvic floor exercises ° °© 2007, [employer] °

## 2019-12-28 ENCOUNTER — Other Ambulatory Visit: Payer: Self-pay | Admitting: Internal Medicine

## 2019-12-28 ENCOUNTER — Other Ambulatory Visit: Payer: Self-pay | Admitting: Urology

## 2019-12-28 DIAGNOSIS — D3000 Benign neoplasm of unspecified kidney: Secondary | ICD-10-CM

## 2019-12-28 NOTE — Telephone Encounter (Signed)
Please refill as per office routine med refill policy (all routine meds refilled for 3 mo or monthly per pt preference up to one year from last visit, then month to month grace period for 3 mo, then further med refills will have to be denied)  

## 2019-12-29 ENCOUNTER — Other Ambulatory Visit: Payer: BC Managed Care – PPO

## 2020-01-22 ENCOUNTER — Ambulatory Visit
Admission: RE | Admit: 2020-01-22 | Discharge: 2020-01-22 | Disposition: A | Discharge status: Home / Self Care | Payer: BC Managed Care – PPO | Source: Ambulatory Visit | Attending: Urology | Admitting: Urology

## 2020-01-22 DIAGNOSIS — D3000 Benign neoplasm of unspecified kidney: Secondary | ICD-10-CM

## 2020-01-30 ENCOUNTER — Other Ambulatory Visit: Payer: Self-pay | Admitting: Internal Medicine

## 2020-01-31 ENCOUNTER — Other Ambulatory Visit: Payer: Self-pay | Admitting: Internal Medicine

## 2020-01-31 NOTE — Telephone Encounter (Signed)
Please refill as per office routine med refill policy (all routine meds refilled for 3 mo or monthly per pt preference up to one year from last visit, then month to month grace period for 3 mo, then further med refills will have to be denied)  

## 2020-02-05 ENCOUNTER — Telehealth: Payer: Self-pay

## 2020-02-05 DIAGNOSIS — E559 Vitamin D deficiency, unspecified: Secondary | ICD-10-CM

## 2020-02-05 DIAGNOSIS — E538 Deficiency of other specified B group vitamins: Secondary | ICD-10-CM

## 2020-02-05 DIAGNOSIS — Z0000 Encounter for general adult medical examination without abnormal findings: Secondary | ICD-10-CM

## 2020-02-05 NOTE — Telephone Encounter (Signed)
New message    The patient needs lab added  due to upcoming physical on  03/07/20

## 2020-02-07 NOTE — Telephone Encounter (Signed)
Lab orders done. 

## 2020-02-11 ENCOUNTER — Other Ambulatory Visit: Payer: Self-pay | Admitting: Internal Medicine

## 2020-02-11 NOTE — Telephone Encounter (Signed)
Please refill as per office routine med refill policy (all routine meds refilled for 3 mo or monthly per pt preference up to one year from last visit, then month to month grace period for 3 mo, then further med refills will have to be denied)  

## 2020-02-12 ENCOUNTER — Telehealth: Payer: Self-pay | Admitting: Internal Medicine

## 2020-02-12 NOTE — Telephone Encounter (Signed)
Pt last seen in 2019 - appt required for any refills.

## 2020-02-12 NOTE — Telephone Encounter (Signed)
New message:   1.Medication Requested: montelukast (SINGULAIR) 10 MG tablet atorvastatin (LIPITOR) 40 MG tablet 2. Pharmacy (Name, Street, [place]): [id number] - [place], [address] 3. On Med List: Yes  4. Last Visit with PCP: 10/12/18  5. Next visit date with PCP:03/07/20  Pt states she will out of these medications before the 1st of April. Agent: Please be advised that RX refills may take up to 3 business days. We ask that you follow-up with your pharmacy.

## 2020-03-04 ENCOUNTER — Other Ambulatory Visit (INDEPENDENT_AMBULATORY_CARE_PROVIDER_SITE_OTHER): Payer: BC Managed Care – PPO

## 2020-03-04 DIAGNOSIS — E559 Vitamin D deficiency, unspecified: Secondary | ICD-10-CM | POA: Diagnosis not present

## 2020-03-04 DIAGNOSIS — Z0000 Encounter for general adult medical examination without abnormal findings: Secondary | ICD-10-CM | POA: Diagnosis not present

## 2020-03-04 DIAGNOSIS — E538 Deficiency of other specified B group vitamins: Secondary | ICD-10-CM | POA: Diagnosis not present

## 2020-03-04 LAB — LIPID PANEL
Cholesterol: 214 mg/dL — ABNORMAL HIGH (ref 0–200)
HDL: 40.7 mg/dL (ref >39.00)
NonHDL: 172.96
Total CHOL/HDL Ratio: 5
Triglycerides: 285 mg/dL — ABNORMAL HIGH (ref 0.0–149.0)
VLDL: 57 mg/dL — ABNORMAL HIGH (ref 0.0–40.0)

## 2020-03-04 LAB — CBC WITH DIFFERENTIAL/PLATELET
Basophils Absolute: 0 10*3/uL (ref 0.0–0.1)
Basophils Relative: 0.5 % (ref 0.0–3.0)
Eosinophils Absolute: 0.2 10*3/uL (ref 0.0–0.7)
Eosinophils Relative: 2.4 % (ref 0.0–5.0)
HCT: 43.7 % (ref 36.0–46.0)
Hemoglobin: 14.7 g/dL (ref 12.0–15.0)
Lymphocytes Relative: 15.2 % (ref 12.0–46.0)
Lymphs Abs: 1 10*3/uL (ref 0.7–4.0)
MCHC: 33.7 g/dL (ref 30.0–36.0)
MCV: 88 fl (ref 78.0–100.0)
Monocytes Absolute: 0.6 10*3/uL (ref 0.1–1.0)
Monocytes Relative: 8.3 % (ref 3.0–12.0)
Neutro Abs: 4.9 10*3/uL (ref 1.4–7.7)
Neutrophils Relative %: 73.6 % (ref 43.0–77.0)
Platelets: 227 10*3/uL (ref 150.0–400.0)
RBC: 4.97 Mil/uL (ref 3.87–5.11)
RDW: 13.5 % (ref 11.5–15.5)
WBC: 6.7 10*3/uL (ref 4.0–10.5)

## 2020-03-04 LAB — HEPATIC FUNCTION PANEL
ALT: 11 U/L (ref 0–35)
AST: 12 U/L (ref 0–37)
Albumin: 4.4 g/dL (ref 3.5–5.2)
Alkaline Phosphatase: 113 U/L (ref 39–117)
Bilirubin, Direct: 0.1 mg/dL (ref 0.0–0.3)
Total Bilirubin: 0.5 mg/dL (ref 0.2–1.2)
Total Protein: 7 g/dL (ref 6.0–8.3)

## 2020-03-04 LAB — BASIC METABOLIC PANEL
BUN: 11 mg/dL (ref 6–23)
CO2: 26 mEq/L (ref 19–32)
Calcium: 9.6 mg/dL (ref 8.4–10.5)
Chloride: 105 mEq/L (ref 96–112)
Creatinine, Ser: 0.88 mg/dL (ref 0.40–1.20)
GFR: 69.96 mL/min (ref >60.00)
Glucose, Bld: 87 mg/dL (ref 70–99)
Potassium: 4.5 mEq/L (ref 3.5–5.1)
Sodium: 137 mEq/L (ref 135–145)

## 2020-03-04 LAB — URINALYSIS, ROUTINE W REFLEX MICROSCOPIC
Bilirubin Urine: NEGATIVE
Ketones, ur: NEGATIVE
Leukocytes,Ua: NEGATIVE
Nitrite: NEGATIVE
Specific Gravity, Urine: 1.01 (ref 1.000–1.030)
Total Protein, Urine: NEGATIVE
Urine Glucose: NEGATIVE
Urobilinogen, UA: 0.2 (ref 0.0–1.0)
pH: 6.5 (ref 5.0–8.0)

## 2020-03-04 LAB — LDL CHOLESTEROL, DIRECT: Direct LDL: 130 mg/dL

## 2020-03-04 LAB — VITAMIN B12: Vitamin B-12: 479 pg/mL (ref 211–911)

## 2020-03-04 LAB — TSH: TSH: 1.33 u[IU]/mL (ref 0.35–4.50)

## 2020-03-04 LAB — VITAMIN D 25 HYDROXY (VIT D DEFICIENCY, FRACTURES): VITD: 29.4 ng/mL — ABNORMAL LOW (ref 30.00–100.00)

## 2020-03-07 ENCOUNTER — Other Ambulatory Visit: Payer: Self-pay

## 2020-03-07 ENCOUNTER — Encounter: Payer: Self-pay | Admitting: Internal Medicine

## 2020-03-07 ENCOUNTER — Ambulatory Visit (INDEPENDENT_AMBULATORY_CARE_PROVIDER_SITE_OTHER): Payer: BC Managed Care – PPO | Admitting: Internal Medicine

## 2020-03-07 ENCOUNTER — Encounter: Payer: BC Managed Care – PPO | Admitting: Internal Medicine

## 2020-03-07 VITALS — BP 138/84 | HR 88 | Temp 98.1°F | Ht 69.0 in | Wt 249.0 lb

## 2020-03-07 DIAGNOSIS — E785 Hyperlipidemia, unspecified: Secondary | ICD-10-CM | POA: Diagnosis not present

## 2020-03-07 DIAGNOSIS — E559 Vitamin D deficiency, unspecified: Secondary | ICD-10-CM

## 2020-03-07 DIAGNOSIS — Z0000 Encounter for general adult medical examination without abnormal findings: Secondary | ICD-10-CM

## 2020-03-07 MED ORDER — ATORVASTATIN CALCIUM 40 MG PO TABS: 40.0000 mg | ORAL_TABLET | Freq: Every day | ORAL | 3 refills | Status: DC

## 2020-03-07 MED ORDER — MONTELUKAST SODIUM 10 MG PO TABS: 10.0000 mg | ORAL_TABLET | Freq: Every day | ORAL | 3 refills | Status: DC

## 2020-03-07 NOTE — Patient Instructions (Addendum)
Please take OTC Vitamin D3 at 2000 units per day, indefinitely  Please continue all other medications as before, and refills have been done if requested.  Please have the pharmacy call with any other refills you may need.  Please continue your efforts at being more active, low cholesterol diet, and weight control.  You are otherwise up to date with prevention measures today.  Please keep your appointments with your specialists as you may have planned  Please make an Appointment to return for your 1 year visit, or sooner if needed, with Lab testing by Appointment as well, to be done about 3-5 days before at the FIRST FLOOR Lab (so this is for TWO appointments - please see the scheduling desk as you leave)  

## 2020-03-07 NOTE — Progress Notes (Addendum)
Subjective:    Patient ID: Robin Ayers, female    DOB: 1976-09-02, 44 y.o.   MRN: [id number]  HPI  Here for wellness and f/u;  Overall doing ok;  Pt denies Chest pain, worsening SOB, DOE, wheezing, orthopnea, PND, worsening LE edema, palpitations, dizziness or syncope.  Pt denies neurological change such as new headache, facial or extremity weakness.  Pt denies polydipsia, polyuria, or low sugar symptoms. Pt states overall good compliance with treatment and medications, good tolerability, and has been trying to follow appropriate diet.  Pt denies worsening depressive symptoms, suicidal ideation or panic. No fever, night sweats, wt loss, loss of appetite, or other constitutional symptoms.  Pt states good ability with ADL's, has low fall risk, home safety reviewed and adequate, no other significant changes in hearing or vision, and only occasionally active with exercise.   Past Medical History:  Diagnosis Date  . ALLERGIC RHINITIS 10/26/2007  . ASTHMA 04/10/2010  . Blood dyscrasia    factor V Leiden   . Clotting disorder ([place])   . DYSLIPIDEMIA 06/24/2009  . Factor V Leiden mutation ([place])    hx of  . Family history of anesthesia complication    PONV  . Ovarian cyst   . PONV (postoperative nausea and vomiting)    Past Surgical History:  Procedure Laterality Date  . ABDOMINAL HYSTERECTOMY    . BLADDER SUSPENSION  05/26/2012   Procedure: TRANSVAGINAL TAPE (TVT) PROCEDURE;  Surgeon: Daria Pastures, MD;  Location: [facility];  Service: Gynecology;  Laterality: N/A;  . CYSTOCELE REPAIR  05/26/2012   Procedure: ANTERIOR REPAIR (CYSTOCELE);  Surgeon: Daria Pastures, MD;  Location: [facility];  Service: Gynecology;  Laterality: N/A;  . CYSTOSCOPY  05/26/2012   Procedure: CYSTOSCOPY;  Surgeon: Daria Pastures, MD;  Location: [facility];  Service: Gynecology;  Laterality: N/A;  . LAPAROSCOPIC ASSISTED VAGINAL HYSTERECTOMY  05/26/2012   Procedure: LAPAROSCOPIC ASSISTED VAGINAL HYSTERECTOMY;   Surgeon: Gus Height, MD;  Location: [facility];  Service: Gynecology;  Laterality: N/A;  . SALPINGOOPHORECTOMY  05/26/2012   Procedure: SALPINGO OOPHERECTOMY;  Surgeon: Gus Height, MD;  Location: [facility];  Service: Gynecology;  Laterality: Left;  . TONSILLECTOMY AND ADENOIDECTOMY    . WISDOM TOOTH EXTRACTION      reports that she has quit smoking. She has never used smokeless tobacco. She reports that she does not drink alcohol or use drugs. family history includes Diabetes in her mother and another family member; Heart attack (age of onset: 62) in her mother; Heart disease in her maternal grandfather and mother; Hypertension in her maternal grandfather, mother, and another family member. Allergies  Allergen Reactions  . Clarithromycin     REACTION: GI upset  . Penicillins     REACTION: rash   Current Outpatient Medications on File Prior to Visit  Medication Sig Dispense Refill  . aspirin 81 MG EC tablet Take 1 tablet (81 mg total) by mouth daily. Swallow whole. 30 tablet 12  . budesonide-formoterol (SYMBICORT) 160-4.5 MCG/ACT inhaler TAKE 2 PUFFS BY MOUTH TWICE A DAY 10.2 Inhaler 12  . cetirizine (ZYRTEC) 10 MG tablet Take 10 mg by mouth daily.    . Multiple Vitamin (MULTIVITAMIN) tablet Take 1 tablet by mouth daily.    Marland Kitchen PROAIR HFA 108 (90 Base) MCG/ACT inhaler TAKE 2 PUFFS BY MOUTH EVERY 6 HOURS AS NEEDED FOR WHEEZE OR SHORTNESS OF BREATH 25.5 Inhaler 3  . Probiotic Product (PROBIOTIC PO) Take by mouth daily.    Marland Kitchen spironolactone (  ALDACTONE) 50 MG tablet Take 50 mg by mouth daily.     No current facility-administered medications on file prior to visit.   Review of Systems All otherwise neg per pt     Objective:   Physical Exam BP 138/84   Pulse 88   Temp 98.1 F (36.7 C)   Ht 5\' 9"  (1.753 m)   Wt 249 lb (112.9 kg)   LMP 04/16/2012   SpO2 99%   BMI 36.77 kg/m  VS noted,  Constitutional: Pt appears in NAD HENT: Head: NCAT.  Right Ear: External ear normal.  Left Ear: External  ear normal.  Eyes: . Pupils are equal, round, and reactive to light. Conjunctivae and EOM are normal Nose: without d/c or deformity Neck: Neck supple. Gross normal ROM Cardiovascular: Normal rate and regular rhythm.   Pulmonary/Chest: Effort normal and breath sounds without rales or wheezing.  Abd:  Soft, NT, ND, + BS, no organomegaly Neurological: Pt is alert. At baseline orientation, motor grossly intact Skin: Skin is warm. No rashes, other new lesions, no LE edema Psychiatric: Pt behavior is normal without agitation  All otherwise neg per pt  Lab Results  Component Value Date   WBC 6.7 03/04/2020   HGB 14.7 03/04/2020   HCT 43.7 03/04/2020   PLT 227.0 03/04/2020   GLUCOSE 87 03/04/2020   CHOL 214 (H) 03/04/2020   TRIG 285.0 (H) 03/04/2020   HDL 40.70 03/04/2020   LDLDIRECT 130.0 03/04/2020   LDLCALC 83 10/12/2018   ALT 11 03/04/2020   AST 12 03/04/2020   NA 137 03/04/2020   K 4.5 03/04/2020   CL 105 03/04/2020   CREATININE 0.88 03/04/2020   BUN 11 03/04/2020   CO2 26 03/04/2020   TSH 1.33 03/04/2020   INR 0.87 05/12/2012      Assessment & Plan:

## 2020-03-08 ENCOUNTER — Encounter: Payer: Self-pay | Admitting: Internal Medicine

## 2020-03-08 DIAGNOSIS — E559 Vitamin D deficiency, unspecified: Secondary | ICD-10-CM | POA: Insufficient documentation

## 2020-03-08 NOTE — Assessment & Plan Note (Signed)
To restart statin.

## 2020-03-08 NOTE — Assessment & Plan Note (Signed)
For oral replacement 

## 2020-03-08 NOTE — Assessment & Plan Note (Signed)

## 2020-07-19 ENCOUNTER — Ambulatory Visit (INDEPENDENT_AMBULATORY_CARE_PROVIDER_SITE_OTHER): Payer: BC Managed Care – PPO | Admitting: Family Medicine

## 2020-07-19 ENCOUNTER — Other Ambulatory Visit: Payer: Self-pay

## 2020-07-19 ENCOUNTER — Ambulatory Visit: Payer: BC Managed Care – PPO | Admitting: Family

## 2020-07-19 ENCOUNTER — Encounter: Payer: Self-pay | Admitting: Family Medicine

## 2020-07-19 VITALS — BP 108/80 | HR 94 | Temp 98.2°F | Ht 69.0 in | Wt 251.0 lb

## 2020-07-19 DIAGNOSIS — R300 Dysuria: Secondary | ICD-10-CM | POA: Diagnosis not present

## 2020-07-19 LAB — POCT URINALYSIS DIPSTICK
Bilirubin, UA: NEGATIVE
Glucose, UA: NEGATIVE
Ketones, UA: NEGATIVE
Nitrite, UA: NEGATIVE
Protein, UA: NEGATIVE
Spec Grav, UA: 1.015 (ref 1.010–1.025)
Urobilinogen, UA: 0.2 E.U./dL
pH, UA: 7 (ref 5.0–8.0)

## 2020-07-19 MED ORDER — NITROFURANTOIN MONOHYD MACRO 100 MG PO CAPS: 100.0000 mg | ORAL_CAPSULE | Freq: Two times a day (BID) | ORAL | 0 refills | Status: DC

## 2020-07-19 NOTE — Progress Notes (Signed)
Established Patient Office Visit  Subjective:  Patient ID: Robin Ayers, female    DOB: May 27, 1976  Age: 44 y.o. MRN: [id number]  CC:  Chief Complaint  Patient presents with  . Dysuria    sx 1 day  . Nocturia    HPI Robin Ayers presents for 1 day history of dysuria.  She has had some burning with urination and some increased frequency.  No flank pain.  No nausea or vomiting.  No fever.  No history of recurrent UTI.  Allergy to penicillin.  Keeping down fluids well.  Past Medical History:  Diagnosis Date  . ALLERGIC RHINITIS 10/26/2007  . ASTHMA 04/10/2010  . Blood dyscrasia    factor V Leiden   . Clotting disorder ([place])   . DYSLIPIDEMIA 06/24/2009  . Factor V Leiden mutation ([place])    hx of  . Family history of anesthesia complication    PONV  . Ovarian cyst   . PONV (postoperative nausea and vomiting)     Past Surgical History:  Procedure Laterality Date  . ABDOMINAL HYSTERECTOMY    . BLADDER SUSPENSION  05/26/2012   Procedure: TRANSVAGINAL TAPE (TVT) PROCEDURE;  Surgeon: Daria Pastures, MD;  Location: [facility];  Service: Gynecology;  Laterality: N/A;  . CYSTOCELE REPAIR  05/26/2012   Procedure: ANTERIOR REPAIR (CYSTOCELE);  Surgeon: Daria Pastures, MD;  Location: [facility];  Service: Gynecology;  Laterality: N/A;  . CYSTOSCOPY  05/26/2012   Procedure: CYSTOSCOPY;  Surgeon: Daria Pastures, MD;  Location: [facility];  Service: Gynecology;  Laterality: N/A;  . LAPAROSCOPIC ASSISTED VAGINAL HYSTERECTOMY  05/26/2012   Procedure: LAPAROSCOPIC ASSISTED VAGINAL HYSTERECTOMY;  Surgeon: Gus Height, MD;  Location: [facility];  Service: Gynecology;  Laterality: N/A;  . SALPINGOOPHORECTOMY  05/26/2012   Procedure: SALPINGO OOPHERECTOMY;  Surgeon: Gus Height, MD;  Location: [facility];  Service: Gynecology;  Laterality: Left;  . TONSILLECTOMY AND ADENOIDECTOMY    . WISDOM TOOTH EXTRACTION      Family History  Problem Relation Age of Onset  . Heart disease  Mother   . Heart attack Mother 76  . Diabetes Mother   . Hypertension Mother   . Diabetes Other   . Hypertension Other   . Hypertension Maternal Grandfather   . Heart disease Maternal Grandfather   . Breast cancer Neg Hx     Social History   Socioeconomic History  . Marital status: Married    Spouse name: Not on file  . Number of children: Not on file  . Years of education: Not on file  . Highest education level: Not on file  Occupational History  . Occupation: Product manager: [employer]  Tobacco Use  . Smoking status: Former Research scientist (life sciences)  . Smokeless tobacco: Never Used  Vaping Use  . Vaping Use: Never used  Substance and Sexual Activity  . Alcohol use: No  . Drug use: No  . Sexual activity: Yes    Partners: Male    Birth control/protection: Surgical    Comment: LAVH 05/2012  Other Topics Concern  . Not on file  Social History Narrative   Regular exercise-yes   Husband has had a vasectomy   Social Determinants of Health   Financial Resource Strain:   . Difficulty of Paying Living Expenses:   Food Insecurity:   . Worried About Charity fundraiser in the Last Year:   . Arboriculturist in the Last Year:   Transportation Needs:   .  Lack of Transportation (Medical):   Marland Kitchen Lack of Transportation (Non-Medical):   Physical Activity:   . Days of Exercise per Week:   . Minutes of Exercise per Session:   Stress:   . Feeling of Stress :   Social Connections:   . Frequency of Communication with Friends and Family:   . Frequency of Social Gatherings with Friends and Family:   . Attends Religious Services:   . Active Member of Clubs or Organizations:   . Attends Archivist Meetings:   Marland Kitchen Marital Status:   Intimate Partner Violence:   . Fear of Current or Ex-Partner:   . Emotionally Abused:   Marland Kitchen Physically Abused:   . Sexually Abused:     Outpatient Medications Prior to Visit  Medication Sig Dispense Refill  . aspirin 81 MG EC tablet Take 1  tablet (81 mg total) by mouth daily. Swallow whole. 30 tablet 12  . atorvastatin (LIPITOR) 40 MG tablet Take 1 tablet (40 mg total) by mouth daily. 90 tablet 3  . budesonide-formoterol (SYMBICORT) 160-4.5 MCG/ACT inhaler TAKE 2 PUFFS BY MOUTH TWICE A DAY 10.2 Inhaler 12  . cetirizine (ZYRTEC) 10 MG tablet Take 10 mg by mouth daily.    . montelukast (SINGULAIR) 10 MG tablet Take 1 tablet (10 mg total) by mouth at bedtime. 90 tablet 3  . Multiple Vitamin (MULTIVITAMIN) tablet Take 1 tablet by mouth daily.    Marland Kitchen PROAIR HFA 108 (90 Base) MCG/ACT inhaler TAKE 2 PUFFS BY MOUTH EVERY 6 HOURS AS NEEDED FOR WHEEZE OR SHORTNESS OF BREATH 25.5 Inhaler 3  . Probiotic Product (PROBIOTIC PO) Take by mouth daily.    Marland Kitchen spironolactone (ALDACTONE) 50 MG tablet Take 50 mg by mouth daily.     No facility-administered medications prior to visit.    Allergies  Allergen Reactions  . Clarithromycin     REACTION: GI upset  . Penicillins     REACTION: rash    ROS Review of Systems  Constitutional: Negative for appetite change, chills and fever.  Gastrointestinal: Negative for abdominal pain, constipation, diarrhea, nausea and vomiting.  Genitourinary: Positive for dysuria and frequency.  Musculoskeletal: Negative for back pain.  Neurological: Negative for dizziness.      Objective:    Physical Exam Constitutional:      Appearance: She is well-developed.  HENT:     Head: Normocephalic and atraumatic.  Neck:     Thyroid: No thyromegaly.  Cardiovascular:     Rate and Rhythm: Normal rate and regular rhythm.     Heart sounds: Normal heart sounds.  Pulmonary:     Breath sounds: Normal breath sounds.  Musculoskeletal:     Cervical back: Neck supple.     BP 108/80   Pulse 94   Temp 98.2 F (36.8 C) (Oral)   Ht 5\' 9"  (1.753 m)   Wt 251 lb (113.9 kg)   LMP 04/16/2012   SpO2 97%   BMI 37.07 kg/m  Wt Readings from Last 3 Encounters:  07/19/20 251 lb (113.9 kg)  03/07/20 249 lb (112.9 kg)    12/14/19 246 lb 9.6 oz (111.9 kg)     Health Maintenance Due  Topic Date Due  . Hepatitis C Screening  Never done  . COVID-19 Vaccine (2 - Pfizer 2-dose series) 06/14/2020  . INFLUENZA VACCINE  07/07/2020    There are no preventive care reminders to display for this patient.  Lab Results  Component Value Date   TSH 1.33 03/04/2020   Lab  Results  Component Value Date   WBC 6.7 03/04/2020   HGB 14.7 03/04/2020   HCT 43.7 03/04/2020   MCV 88.0 03/04/2020   PLT 227.0 03/04/2020   Lab Results  Component Value Date   NA 137 03/04/2020   K 4.5 03/04/2020   CO2 26 03/04/2020   GLUCOSE 87 03/04/2020   BUN 11 03/04/2020   CREATININE 0.88 03/04/2020   BILITOT 0.5 03/04/2020   ALKPHOS 113 03/04/2020   AST 12 03/04/2020   ALT 11 03/04/2020   PROT 7.0 03/04/2020   ALBUMIN 4.4 03/04/2020   CALCIUM 9.6 03/04/2020   GFR 69.96 03/04/2020   Lab Results  Component Value Date   CHOL 214 (H) 03/04/2020   Lab Results  Component Value Date   HDL 40.70 03/04/2020   Lab Results  Component Value Date   LDLCALC 83 10/12/2018   Lab Results  Component Value Date   TRIG 285.0 (H) 03/04/2020   Lab Results  Component Value Date   CHOLHDL 5 03/04/2020   No results found for: HGBA1C    Assessment & Plan:   Problem List Items Addressed This Visit    None    Visit Diagnoses    Dysuria    -  Primary   Relevant Orders   POCT urinalysis dipstick (Completed)   Culture, Urine    Suspect uncomplicated cystitis.  Patient nontoxic in appearance.  -Urine culture obtained -Start Macrobid 1 p.o. twice daily for 5 days -Follow-up promptly for any increased fever or any persistent or worsening symptoms  Meds ordered this encounter  Medications  . nitrofurantoin, macrocrystal-monohydrate, (MACROBID) 100 MG capsule    Sig: Take 1 capsule (100 mg total) by mouth 2 (two) times daily.    Dispense:  10 capsule    Refill:  0    Follow-up: No follow-ups on file.    Carolann Littler, MD

## 2020-07-19 NOTE — Patient Instructions (Signed)

## 2020-07-21 LAB — URINE CULTURE
MICRO NUMBER:: 10824571
SPECIMEN QUALITY:: ADEQUATE

## 2020-10-07 ENCOUNTER — Other Ambulatory Visit: Payer: Self-pay | Admitting: Internal Medicine

## 2020-10-07 DIAGNOSIS — Z1231 Encounter for screening mammogram for malignant neoplasm of breast: Secondary | ICD-10-CM

## 2020-10-14 ENCOUNTER — Other Ambulatory Visit: Payer: Self-pay | Admitting: Internal Medicine

## 2020-11-12 ENCOUNTER — Other Ambulatory Visit: Payer: Self-pay

## 2020-11-12 ENCOUNTER — Ambulatory Visit
Admission: RE | Admit: 2020-11-12 | Discharge: 2020-11-12 | Disposition: A | Discharge status: Home / Self Care | Payer: BC Managed Care – PPO | Source: Ambulatory Visit | Attending: Internal Medicine | Admitting: Internal Medicine

## 2020-11-12 DIAGNOSIS — Z1231 Encounter for screening mammogram for malignant neoplasm of breast: Secondary | ICD-10-CM

## 2020-11-18 ENCOUNTER — Other Ambulatory Visit: Payer: Self-pay | Admitting: Internal Medicine

## 2020-11-18 DIAGNOSIS — R928 Other abnormal and inconclusive findings on diagnostic imaging of breast: Secondary | ICD-10-CM

## 2020-11-27 ENCOUNTER — Ambulatory Visit: Payer: BC Managed Care – PPO

## 2020-11-27 ENCOUNTER — Other Ambulatory Visit: Payer: Self-pay

## 2020-11-27 ENCOUNTER — Ambulatory Visit
Admission: RE | Admit: 2020-11-27 | Discharge: 2020-11-27 | Disposition: A | Discharge status: Home / Self Care | Payer: BC Managed Care – PPO | Source: Ambulatory Visit | Attending: Internal Medicine | Admitting: Internal Medicine

## 2020-11-27 DIAGNOSIS — R928 Other abnormal and inconclusive findings on diagnostic imaging of breast: Secondary | ICD-10-CM

## 2020-12-18 ENCOUNTER — Ambulatory Visit: Payer: BC Managed Care – PPO | Admitting: Obstetrics and Gynecology

## 2021-01-16 ENCOUNTER — Ambulatory Visit (INDEPENDENT_AMBULATORY_CARE_PROVIDER_SITE_OTHER): Payer: BC Managed Care – PPO | Admitting: Obstetrics and Gynecology

## 2021-01-16 ENCOUNTER — Encounter: Payer: Self-pay | Admitting: Obstetrics and Gynecology

## 2021-01-16 ENCOUNTER — Other Ambulatory Visit: Payer: Self-pay

## 2021-01-16 VITALS — BP 126/64 | HR 121 | Ht 68.75 in | Wt 254.3 lb

## 2021-01-16 DIAGNOSIS — N816 Rectocele: Secondary | ICD-10-CM

## 2021-01-16 DIAGNOSIS — Z01419 Encounter for gynecological examination (general) (routine) without abnormal findings: Secondary | ICD-10-CM

## 2021-01-16 NOTE — Patient Instructions (Signed)

## 2021-01-16 NOTE — Progress Notes (Signed)
45 y.o. [id number] Married White or Caucasian Not Hispanic or Latino female here for annual exam.  H/O hysterectomy. Sexual active, no pain.   No bowel or bladder c/o.     H/O factor V Leiden and DVT  H/O asymptomatic rectocele.   Patient's last menstrual period was 04/16/2012.          Sexually active: Yes.    The current method of family planning is status post hysterectomy.    Exercising: No.  The patient does not participate in regular exercise at present. Smoker:  no  Health Maintenance: Pap:  05/2012 WNL hysterectomy, vaginal pap 2018, negative. History of abnormal Pap:  Yes- years ago- early 14s, had a colposcopy, no surgery on her cervix. MMG:  11/27/20 Right Tomo Bi-rads 1 neg  BMD:   None  Colonoscopy: none  TDaP:  10/04/17  Gardasil: NA    reports that she has quit smoking. She has never used smokeless tobacco. She reports that she does not drink alcohol and does not use drugs. 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 She is getting her masters, finishes in 8/22. Daughters are 25and 23.  Past Medical History:  Diagnosis Date  . ALLERGIC RHINITIS 10/26/2007  . ASTHMA 04/10/2010  . Blood dyscrasia    factor V Leiden   . Clotting disorder ([place])   . DYSLIPIDEMIA 06/24/2009  . Factor V Leiden mutation ([place])    hx of  . Family history of anesthesia complication    PONV  . Ovarian cyst   . PONV (postoperative nausea and vomiting)     Past Surgical History:  Procedure Laterality Date  . ABDOMINAL HYSTERECTOMY    . BLADDER SUSPENSION  05/26/2012   Procedure: TRANSVAGINAL TAPE (TVT) PROCEDURE;  Surgeon: Daria Pastures, MD;  Location: [facility];  Service: Gynecology;  Laterality: N/A;  . CYSTOCELE REPAIR  05/26/2012   Procedure: ANTERIOR REPAIR (CYSTOCELE);  Surgeon: Daria Pastures, MD;  Location: [facility];  Service: Gynecology;  Laterality: N/A;  . CYSTOSCOPY  05/26/2012   Procedure: CYSTOSCOPY;  Surgeon: Daria Pastures, MD;  Location: [facility];  Service: Gynecology;   Laterality: N/A;  . LAPAROSCOPIC ASSISTED VAGINAL HYSTERECTOMY  05/26/2012   Procedure: LAPAROSCOPIC ASSISTED VAGINAL HYSTERECTOMY;  Surgeon: Gus Height, MD;  Location: [facility];  Service: Gynecology;  Laterality: N/A;  . SALPINGOOPHORECTOMY  05/26/2012   Procedure: SALPINGO OOPHERECTOMY;  Surgeon: Gus Height, MD;  Location: [facility];  Service: Gynecology;  Laterality: Left;  . TONSILLECTOMY AND ADENOIDECTOMY    . WISDOM TOOTH EXTRACTION      Current Outpatient Medications  Medication Sig Dispense Refill  . aspirin 81 MG EC tablet Take 1 tablet (81 mg total) by mouth daily. Swallow whole. 30 tablet 12  . atorvastatin (LIPITOR) 40 MG tablet Take 1 tablet (40 mg total) by mouth daily. 90 tablet 3  . budesonide-formoterol (SYMBICORT) 160-4.5 MCG/ACT inhaler TAKE 2 PUFFS BY MOUTH TWICE A DAY 30.6 each 4  . cetirizine (ZYRTEC) 10 MG tablet Take 10 mg by mouth daily.    . montelukast (SINGULAIR) 10 MG tablet Take 1 tablet (10 mg total) by mouth at bedtime. 90 tablet 3  . Multiple Vitamin (MULTIVITAMIN) tablet Take 1 tablet by mouth daily.    Marland Kitchen PROAIR HFA 108 (90 Base) MCG/ACT inhaler TAKE 2 PUFFS BY MOUTH EVERY 6 HOURS AS NEEDED FOR WHEEZE OR SHORTNESS OF BREATH 25.5 Inhaler 3  . Probiotic Product (PROBIOTIC PO) Take by mouth daily.    Marland Kitchen spironolactone (ALDACTONE) 50 MG tablet  Take 50 mg by mouth daily.     No current facility-administered medications for this visit.    Family History  Problem Relation Age of Onset  . Heart disease Mother   . Heart attack Mother 8  . Diabetes Mother   . Hypertension Mother   . Diabetes Other   . Hypertension Other   . Hypertension Maternal Grandfather   . Heart disease Maternal Grandfather   . Breast cancer Neg Hx     Review of Systems  All other systems reviewed and are negative.   Exam:   BP 126/64   Pulse (!) 121   Ht 5' 8.75" (1.746 m)   Wt 254 lb 4.8 oz (115.3 kg)   LMP 04/16/2012   SpO2 99%   BMI 37.83 kg/m   Weight change: @WEIGHTCHANGE @  Height:   Height: 5' 8.75" (174.6 cm)  Ht Readings from Last 3 Encounters:  01/16/21 5' 8.75" (1.746 m)  07/19/20 5\' 9"  (1.753 m)  03/07/20 5\' 9"  (1.753 m)    General appearance: alert, cooperative and appears stated age Head: Normocephalic, without obvious abnormality, atraumatic Neck: no adenopathy, supple, symmetrical, trachea midline and thyroid normal to inspection and palpation Lungs: clear to auscultation bilaterally Cardiovascular: regular rate and rhythm Breasts: normal appearance, no masses or tenderness Abdomen: soft, non-tender; non distended,  no masses,  no organomegaly Extremities: extremities normal, atraumatic, no cyanosis or edema Skin: Skin color, texture, turgor normal. No rashes or lesions Lymph nodes: Cervical, supraclavicular, and axillary nodes normal. No abnormal inguinal nodes palpated Neurologic: Grossly normal   Pelvic: External genitalia:  no lesions              Urethra:  normal appearing urethra with no masses, tenderness or lesions              Bartholins and Skenes: normal                 Vagina: normal appearing vagina with normal color and discharge, no lesions. Small grade 1-2 rectocele.               Cervix: absent               Bimanual Exam:  Uterus:  uterus absent              Adnexa: no mass, fullness, tenderness               Rectovaginal: Confirms               Anus:  normal sphincter tone, no lesions     1. Well woman exam Discussed breast self exam Discussed calcium and vit D intake No pap needed Mammogram is UTD  2. Rectocele Not symptomatic, stable Avoid heavy lifting and straining

## 2021-02-14 ENCOUNTER — Other Ambulatory Visit: Payer: Self-pay | Admitting: Internal Medicine

## 2021-02-14 NOTE — Telephone Encounter (Signed)
Please refill as per office routine med refill policy (all routine meds refilled for 3 mo or monthly per pt preference up to one year from last visit, then month to month grace period for 3 mo, then further med refills will have to be denied)  

## 2021-03-06 ENCOUNTER — Other Ambulatory Visit (INDEPENDENT_AMBULATORY_CARE_PROVIDER_SITE_OTHER): Payer: BC Managed Care – PPO

## 2021-03-06 DIAGNOSIS — E559 Vitamin D deficiency, unspecified: Secondary | ICD-10-CM | POA: Diagnosis not present

## 2021-03-06 DIAGNOSIS — Z0000 Encounter for general adult medical examination without abnormal findings: Secondary | ICD-10-CM | POA: Diagnosis not present

## 2021-03-06 LAB — LIPID PANEL
Cholesterol: 145 mg/dL (ref 0–200)
HDL: 40.4 mg/dL (ref >39.00)
LDL Cholesterol: 77 mg/dL (ref 0–99)
NonHDL: 104.45
Total CHOL/HDL Ratio: 4
Triglycerides: 139 mg/dL (ref 0.0–149.0)
VLDL: 27.8 mg/dL (ref 0.0–40.0)

## 2021-03-06 LAB — BASIC METABOLIC PANEL
BUN: 18 mg/dL (ref 6–23)
CO2: 28 mEq/L (ref 19–32)
Calcium: 9.3 mg/dL (ref 8.4–10.5)
Chloride: 104 mEq/L (ref 96–112)
Creatinine, Ser: 1 mg/dL (ref 0.40–1.20)
GFR: 68.51 mL/min (ref >60.00)
Glucose, Bld: 89 mg/dL (ref 70–99)
Potassium: 4.5 mEq/L (ref 3.5–5.1)
Sodium: 138 mEq/L (ref 135–145)

## 2021-03-06 LAB — CBC WITH DIFFERENTIAL/PLATELET
Basophils Absolute: 0 10*3/uL (ref 0.0–0.1)
Basophils Relative: 0.5 % (ref 0.0–3.0)
Eosinophils Absolute: 0.1 10*3/uL (ref 0.0–0.7)
Eosinophils Relative: 1 % (ref 0.0–5.0)
HCT: 41.8 % (ref 36.0–46.0)
Hemoglobin: 14.1 g/dL (ref 12.0–15.0)
Lymphocytes Relative: 11.3 % — ABNORMAL LOW (ref 12.0–46.0)
Lymphs Abs: 0.9 10*3/uL (ref 0.7–4.0)
MCHC: 33.7 g/dL (ref 30.0–36.0)
MCV: 87.7 fl (ref 78.0–100.0)
Monocytes Absolute: 0.5 10*3/uL (ref 0.1–1.0)
Monocytes Relative: 7.2 % (ref 3.0–12.0)
Neutro Abs: 6 10*3/uL (ref 1.4–7.7)
Neutrophils Relative %: 80 % — ABNORMAL HIGH (ref 43.0–77.0)
Platelets: 236 10*3/uL (ref 150.0–400.0)
RBC: 4.76 Mil/uL (ref 3.87–5.11)
RDW: 13.2 % (ref 11.5–15.5)
WBC: 7.5 10*3/uL (ref 4.0–10.5)

## 2021-03-06 LAB — URINALYSIS, ROUTINE W REFLEX MICROSCOPIC
Bilirubin Urine: NEGATIVE
Ketones, ur: NEGATIVE
Leukocytes,Ua: NEGATIVE
Nitrite: NEGATIVE
Specific Gravity, Urine: 1.015 (ref 1.000–1.030)
Total Protein, Urine: NEGATIVE
Urine Glucose: NEGATIVE
Urobilinogen, UA: 0.2 (ref 0.0–1.0)
pH: 5.5 (ref 5.0–8.0)

## 2021-03-06 LAB — HEPATIC FUNCTION PANEL
ALT: 13 U/L (ref 0–35)
AST: 13 U/L (ref 0–37)
Albumin: 4.4 g/dL (ref 3.5–5.2)
Alkaline Phosphatase: 112 U/L (ref 39–117)
Bilirubin, Direct: 0.1 mg/dL (ref 0.0–0.3)
Total Bilirubin: 0.6 mg/dL (ref 0.2–1.2)
Total Protein: 7.2 g/dL (ref 6.0–8.3)

## 2021-03-06 LAB — TSH: TSH: 1.24 u[IU]/mL (ref 0.35–4.50)

## 2021-03-06 LAB — VITAMIN D 25 HYDROXY (VIT D DEFICIENCY, FRACTURES): VITD: 36.93 ng/mL (ref 30.00–100.00)

## 2021-03-07 ENCOUNTER — Other Ambulatory Visit: Payer: Self-pay

## 2021-03-10 ENCOUNTER — Ambulatory Visit (INDEPENDENT_AMBULATORY_CARE_PROVIDER_SITE_OTHER): Payer: BC Managed Care – PPO | Admitting: Internal Medicine

## 2021-03-10 ENCOUNTER — Encounter: Payer: Self-pay | Admitting: Internal Medicine

## 2021-03-10 ENCOUNTER — Other Ambulatory Visit: Payer: Self-pay

## 2021-03-10 VITALS — BP 122/74 | HR 81 | Temp 98.6°F | Ht 68.75 in | Wt 242.0 lb

## 2021-03-10 DIAGNOSIS — Z0001 Encounter for general adult medical examination with abnormal findings: Secondary | ICD-10-CM | POA: Diagnosis not present

## 2021-03-10 DIAGNOSIS — E782 Mixed hyperlipidemia: Secondary | ICD-10-CM | POA: Diagnosis not present

## 2021-03-10 DIAGNOSIS — J4520 Mild intermittent asthma, uncomplicated: Secondary | ICD-10-CM | POA: Diagnosis not present

## 2021-03-10 DIAGNOSIS — Z0000 Encounter for general adult medical examination without abnormal findings: Secondary | ICD-10-CM

## 2021-03-10 DIAGNOSIS — E559 Vitamin D deficiency, unspecified: Secondary | ICD-10-CM | POA: Diagnosis not present

## 2021-03-10 DIAGNOSIS — G43809 Other migraine, not intractable, without status migrainosus: Secondary | ICD-10-CM | POA: Diagnosis not present

## 2021-03-10 MED ORDER — RIZATRIPTAN BENZOATE 10 MG PO TBDP: 10.0000 mg | ORAL_TABLET | ORAL | 11 refills | Status: DC | PRN

## 2021-03-10 NOTE — Progress Notes (Signed)
Patient ID: Robin Ayers, female   DOB: 02-01-1976, 45 y.o.   MRN: [id number]         Chief Complaint:: wellness exam and low vit d, hld, migraine       HPI:  Robin Ayers is a 45 y.o. female here for wellness exam, due for hep c screening, o/w up to date with preventive referrals and immunizations                        Also taking vit d 2000 units for > 6 months.  Trying to work on wt control and lower cholesterol diet.  Pt denies chest pain, increased sob or doe, wheezing, orthopnea, PND, increased LE swelling, palpitations, dizziness or syncope.  Denies new worsening neuro s/s, but unfortunately with 2 mo increased typical migraines frequency and severity, no longer amenable to otc meds.  Has ongoing stressors, otherwise nothing else seems to make better or worse.  Has not tried triptans in the past.   Pt denies polydipsia, polyuria,   Pt denies fever, wt loss, night sweats, loss of appetite, or other constitutional symptoms  Denies worsening reflux, abd pain, dysphagia, n/v, bowel change or blood.  Denies worsening depressive symptoms, suicidal ideation, or panic  Wt Readings from Last 3 Encounters:  03/10/21 242 lb (109.8 kg)  01/16/21 254 lb 4.8 oz (115.3 kg)  07/19/20 251 lb (113.9 kg)   BP Readings from Last 3 Encounters:  03/10/21 122/74  01/16/21 126/64  07/19/20 108/80   Immunization History  Administered Date(s) Administered  . Influenza Split 09/09/2012  . Influenza Whole 08/21/2010, 09/07/2019  . Influenza,inj,Quad PF,6+ Mos 09/02/2016, 10/12/2018  . Influenza-Unspecified 09/07/2015  . PFIZER(Purple Top)SARS-COV-2 Vaccination 05/24/2020, 06/20/2020  . Tdap 10/04/2017   There are no preventive care reminders to display for this patient.    Past Medical History:  Diagnosis Date  . ALLERGIC RHINITIS 10/26/2007  . ASTHMA 04/10/2010  . Blood dyscrasia    factor V Leiden   . Clotting disorder ([place])   . DYSLIPIDEMIA 06/24/2009  . Factor V Leiden  mutation ([place])    hx of  . Family history of anesthesia complication    PONV  . Ovarian cyst   . PONV (postoperative nausea and vomiting)    Past Surgical History:  Procedure Laterality Date  . ABDOMINAL HYSTERECTOMY    . BLADDER SUSPENSION  05/26/2012   Procedure: TRANSVAGINAL TAPE (TVT) PROCEDURE;  Surgeon: Daria Pastures, MD;  Location: [facility];  Service: Gynecology;  Laterality: N/A;  . CYSTOCELE REPAIR  05/26/2012   Procedure: ANTERIOR REPAIR (CYSTOCELE);  Surgeon: Daria Pastures, MD;  Location: [facility];  Service: Gynecology;  Laterality: N/A;  . CYSTOSCOPY  05/26/2012   Procedure: CYSTOSCOPY;  Surgeon: Daria Pastures, MD;  Location: [facility];  Service: Gynecology;  Laterality: N/A;  . LAPAROSCOPIC ASSISTED VAGINAL HYSTERECTOMY  05/26/2012   Procedure: LAPAROSCOPIC ASSISTED VAGINAL HYSTERECTOMY;  Surgeon: Gus Height, MD;  Location: [facility];  Service: Gynecology;  Laterality: N/A;  . SALPINGOOPHORECTOMY  05/26/2012   Procedure: SALPINGO OOPHERECTOMY;  Surgeon: Gus Height, MD;  Location: [facility];  Service: Gynecology;  Laterality: Left;  . TONSILLECTOMY AND ADENOIDECTOMY    . WISDOM TOOTH EXTRACTION      reports that she has quit smoking. She has never used smokeless tobacco. She reports that she does not drink alcohol and does not use drugs. family history includes Diabetes in her mother and another family member; Heart attack (age  of onset: 69) in her mother; Heart disease in her maternal grandfather and mother; Hypertension in her maternal grandfather, mother, and another family member. Allergies  Allergen Reactions  . Clarithromycin     REACTION: GI upset  . Penicillins     REACTION: rash   Current Outpatient Medications on File Prior to Visit  Medication Sig Dispense Refill  . aspirin 81 MG EC tablet Take 1 tablet (81 mg total) by mouth daily. Swallow whole. 30 tablet 12  . atorvastatin (LIPITOR) 40 MG tablet TAKE 1 TABLET BY MOUTH EVERY DAY 90 tablet 3  .  budesonide-formoterol (SYMBICORT) 160-4.5 MCG/ACT inhaler TAKE 2 PUFFS BY MOUTH TWICE A DAY 30.6 each 4  . cetirizine (ZYRTEC) 10 MG tablet Take 10 mg by mouth daily.    . Diethylpropion HCl CR 75 MG TB24 Take by mouth.    . montelukast (SINGULAIR) 10 MG tablet TAKE 1 TABLET BY MOUTH EVERYDAY AT BEDTIME 90 tablet 3  . Multiple Vitamin (MULTIVITAMIN) tablet Take 1 tablet by mouth daily.    Marland Kitchen PROAIR HFA 108 (90 Base) MCG/ACT inhaler TAKE 2 PUFFS BY MOUTH EVERY 6 HOURS AS NEEDED FOR WHEEZE OR SHORTNESS OF BREATH 25.5 Inhaler 3  . Probiotic Product (PROBIOTIC PO) Take by mouth daily.    Marland Kitchen spironolactone (ALDACTONE) 50 MG tablet Take 50 mg by mouth daily.     No current facility-administered medications on file prior to visit.        ROS:  All others reviewed and negative.  Objective        PE:  BP 122/74 (BP Location: Left Arm, Patient Position: Sitting, Cuff Size: Large)   Pulse 81   Temp 98.6 F (37 C) (Oral)   Ht 5' 8.75" (1.746 m)   Wt 242 lb (109.8 kg)   LMP 04/16/2012   SpO2 98%   BMI 36.00 kg/m                 Constitutional: Pt appears in NAD               HENT: Head: NCAT.                Right Ear: External ear normal.                 Left Ear: External ear normal.                Eyes: . Pupils are equal, round, and reactive to light. Conjunctivae and EOM are normal               Nose: without d/c or deformity               Neck: Neck supple. Gross normal ROM               Cardiovascular: Normal rate and regular rhythm.                 Pulmonary/Chest: Effort normal and breath sounds without rales or wheezing.                Abd:  Soft, NT, ND, + BS, no organomegaly               Neurological: Pt is alert. At baseline orientation, motor grossly intact               Skin: Skin is warm. No rashes, no other new lesions, LE edema - none  Psychiatric: Pt behavior is normal without agitation   Micro: none  Cardiac tracings I have personally interpreted today:   none  Pertinent Radiological findings (summarize): none   Lab Results  Component Value Date   WBC 7.5 03/06/2021   HGB 14.1 03/06/2021   HCT 41.8 03/06/2021   PLT 236.0 03/06/2021   GLUCOSE 89 03/06/2021   CHOL 145 03/06/2021   TRIG 139.0 03/06/2021   HDL 40.40 03/06/2021   LDLDIRECT 130.0 03/04/2020   LDLCALC 77 03/06/2021   ALT 13 03/06/2021   AST 13 03/06/2021   NA 138 03/06/2021   K 4.5 03/06/2021   CL 104 03/06/2021   CREATININE 1.00 03/06/2021   BUN 18 03/06/2021   CO2 28 03/06/2021   TSH 1.24 03/06/2021   INR 0.87 05/12/2012   Assessment/Plan:  Michaiah Maiden Murphy-Justice is a 45 y.o. White or Caucasian [1] female with  has a past medical history of ALLERGIC RHINITIS (10/26/2007), ASTHMA (04/10/2010), Blood dyscrasia, Clotting disorder ([place]), DYSLIPIDEMIA (06/24/2009), Factor V Leiden mutation ([place]), Family history of anesthesia complication, Ovarian cyst, and PONV (postoperative nausea and vomiting).  Encounter for well adult exam with abnormal findings Age and sex appropriate education and counseling updated with regular exercise and diet Referrals for preventative services - decliens hep c Immunizations addressed - none needed Smoking counseling  - none needed Evidence for depression or other mood disorder - none significant Most recent labs reviewed. I have personally reviewed and have noted: 1) the patient's medical and social history 2) The patient's current medications and supplements 3) The patient's height, weight, and BMI have been recorded in the chart   Vitamin D deficiency Last vitamin D Lab Results  Component Value Date   VD25OH 36.93 03/06/2021   Low normal, to increase vit d3 to 4000 u qd oral replacement   Hyperlipidemia / Lab Results  Component Value Date   LDLCALC 77 03/06/2021   Stable, pt to continue current statin liptior 40   Current Outpatient Medications (Cardiovascular):  .  atorvastatin (LIPITOR) 40 MG tablet, TAKE 1 TABLET  BY MOUTH EVERY DAY .  spironolactone (ALDACTONE) 50 MG tablet, Take 50 mg by mouth daily.  Current Outpatient Medications (Respiratory):  .  budesonide-formoterol (SYMBICORT) 160-4.5 MCG/ACT inhaler, TAKE 2 PUFFS BY MOUTH TWICE A DAY .  cetirizine (ZYRTEC) 10 MG tablet, Take 10 mg by mouth daily. .  montelukast (SINGULAIR) 10 MG tablet, TAKE 1 TABLET BY MOUTH EVERYDAY AT BEDTIME .  PROAIR HFA 108 (90 Base) MCG/ACT inhaler, TAKE 2 PUFFS BY MOUTH EVERY 6 HOURS AS NEEDED FOR WHEEZE OR SHORTNESS OF BREATH  Current Outpatient Medications (Analgesics):  .  aspirin 81 MG EC tablet, Take 1 tablet (81 mg total) by mouth daily. Swallow whole. .  rizatriptan (MAXALT-MLT) 10 MG disintegrating tablet, Take 1 tablet (10 mg total) by mouth as needed for migraine. May repeat in 2 hours if needed   Current Outpatient Medications (Other):  Marland Kitchen  Diethylpropion HCl CR 75 MG TB24, Take by mouth. .  Multiple Vitamin (MULTIVITAMIN) tablet, Take 1 tablet by mouth daily. .  Probiotic Product (PROBIOTIC PO), Take by mouth daily.   Asthma Stable, cont current med tx - singular, proair, symbicort   Migraine With recent worsening freq and severity - for maxalt 10 mlt prn  Followup: Return in about 1 year (around 03/10/2022).  Cathlean Cower, MD 03/15/2021 7:33 PM [facility]

## 2021-03-10 NOTE — Patient Instructions (Signed)
Please take all new medication as prescribed - the maxalt 10 MLT  Ok to increase the Vitamin D to 4000 units per day  Please continue all other medications as before, and refills have been done if requested.  Please have the pharmacy call with any other refills you may need.  Please continue your efforts at being more active, low cholesterol diet, and weight control.  You are otherwise up to date with prevention measures today.  Please keep your appointments with your specialists as you may have planned  Please make an Appointment to return for your 1 year visit, or sooner if needed, with Lab testing by Appointment as well, to be done about 3-5 days before at the [facility] (so this is for TWO appointments - please see the scheduling desk as you leave)  Due to the ongoing Covid 19 pandemic, our lab now requires an appointment for any labs done at our office.  If you need labs done and do not have an appointment, please call our office ahead of time to schedule before presenting to the lab for your testing.

## 2021-03-15 ENCOUNTER — Encounter: Payer: Self-pay | Admitting: Internal Medicine

## 2021-03-15 DIAGNOSIS — G43909 Migraine, unspecified, not intractable, without status migrainosus: Secondary | ICD-10-CM | POA: Insufficient documentation

## 2021-03-15 NOTE — Assessment & Plan Note (Signed)
Last vitamin D Lab Results  Component Value Date   VD25OH 36.93 03/06/2021   Low normal, to increase vit d3 to 4000 u qd oral replacement

## 2021-03-15 NOTE — Assessment & Plan Note (Signed)
Stable, cont current med tx - singular, proair, symbicort

## 2021-03-15 NOTE — Assessment & Plan Note (Signed)
With recent worsening freq and severity - for maxalt 10 mlt prn

## 2021-03-15 NOTE — Assessment & Plan Note (Signed)
/   Lab Results  Component Value Date   LDLCALC 77 03/06/2021   Stable, pt to continue current statin liptior 40   Current Outpatient Medications (Cardiovascular):  .  atorvastatin (LIPITOR) 40 MG tablet, TAKE 1 TABLET BY MOUTH EVERY DAY .  spironolactone (ALDACTONE) 50 MG tablet, Take 50 mg by mouth daily.  Current Outpatient Medications (Respiratory):  .  budesonide-formoterol (SYMBICORT) 160-4.5 MCG/ACT inhaler, TAKE 2 PUFFS BY MOUTH TWICE A DAY .  cetirizine (ZYRTEC) 10 MG tablet, Take 10 mg by mouth daily. .  montelukast (SINGULAIR) 10 MG tablet, TAKE 1 TABLET BY MOUTH EVERYDAY AT BEDTIME .  PROAIR HFA 108 (90 Base) MCG/ACT inhaler, TAKE 2 PUFFS BY MOUTH EVERY 6 HOURS AS NEEDED FOR WHEEZE OR SHORTNESS OF BREATH  Current Outpatient Medications (Analgesics):  .  aspirin 81 MG EC tablet, Take 1 tablet (81 mg total) by mouth daily. Swallow whole. .  rizatriptan (MAXALT-MLT) 10 MG disintegrating tablet, Take 1 tablet (10 mg total) by mouth as needed for migraine. May repeat in 2 hours if needed   Current Outpatient Medications (Other):  Marland Kitchen  Diethylpropion HCl CR 75 MG TB24, Take by mouth. .  Multiple Vitamin (MULTIVITAMIN) tablet, Take 1 tablet by mouth daily. .  Probiotic Product (PROBIOTIC PO), Take by mouth daily.

## 2021-03-15 NOTE — Assessment & Plan Note (Signed)
Age and sex appropriate education and counseling updated with regular exercise and diet Referrals for preventative services - decliens hep c Immunizations addressed - none needed Smoking counseling  - none needed Evidence for depression or other mood disorder - none significant Most recent labs reviewed. I have personally reviewed and have noted: 1) the patient's medical and social history 2) The patient's current medications and supplements 3) The patient's height, weight, and BMI have been recorded in the chart

## 2021-08-04 ENCOUNTER — Other Ambulatory Visit: Payer: Self-pay | Admitting: Urology

## 2021-08-04 DIAGNOSIS — D3000 Benign neoplasm of unspecified kidney: Secondary | ICD-10-CM

## 2021-08-15 ENCOUNTER — Ambulatory Visit
Admission: RE | Admit: 2021-08-15 | Discharge: 2021-08-15 | Disposition: A | Discharge status: Home / Self Care | Payer: BC Managed Care – PPO | Source: Ambulatory Visit | Attending: Urology | Admitting: Urology

## 2021-08-15 ENCOUNTER — Other Ambulatory Visit: Payer: Self-pay

## 2021-08-15 DIAGNOSIS — D3000 Benign neoplasm of unspecified kidney: Secondary | ICD-10-CM

## 2021-10-14 ENCOUNTER — Other Ambulatory Visit: Payer: Self-pay | Admitting: Internal Medicine

## 2021-10-14 DIAGNOSIS — Z1231 Encounter for screening mammogram for malignant neoplasm of breast: Secondary | ICD-10-CM

## 2021-11-13 ENCOUNTER — Other Ambulatory Visit: Payer: Self-pay

## 2021-11-13 ENCOUNTER — Ambulatory Visit
Admission: RE | Admit: 2021-11-13 | Discharge: 2021-11-13 | Disposition: A | Discharge status: Home / Self Care | Payer: BC Managed Care – PPO | Source: Ambulatory Visit | Attending: Internal Medicine | Admitting: Internal Medicine

## 2021-11-13 DIAGNOSIS — Z1231 Encounter for screening mammogram for malignant neoplasm of breast: Secondary | ICD-10-CM

## 2022-01-10 ENCOUNTER — Other Ambulatory Visit: Payer: Self-pay | Admitting: Internal Medicine

## 2022-01-10 NOTE — Telephone Encounter (Signed)
Please refill as per office routine med refill policy (all routine meds to be refilled for 3 mo or monthly (per pt preference) up to one year from last visit, then month to month grace period for 3 mo, then further med refills will have to be denied) ? ?

## 2022-03-07 ENCOUNTER — Other Ambulatory Visit: Payer: Self-pay | Admitting: Internal Medicine

## 2022-03-07 NOTE — Telephone Encounter (Signed)
Please refill as per office routine med refill policy (all routine meds to be refilled for 3 mo or monthly (per pt preference) up to one year from last visit, then month to month grace period for 3 mo, then further med refills will have to be denied) ? ?

## 2022-03-09 ENCOUNTER — Other Ambulatory Visit (INDEPENDENT_AMBULATORY_CARE_PROVIDER_SITE_OTHER): Payer: BC Managed Care – PPO

## 2022-03-09 DIAGNOSIS — Z0000 Encounter for general adult medical examination without abnormal findings: Secondary | ICD-10-CM | POA: Diagnosis not present

## 2022-03-09 DIAGNOSIS — E559 Vitamin D deficiency, unspecified: Secondary | ICD-10-CM | POA: Diagnosis not present

## 2022-03-09 LAB — BASIC METABOLIC PANEL
BUN: 17 mg/dL (ref 6–23)
CO2: 26 mEq/L (ref 19–32)
Calcium: 9.4 mg/dL (ref 8.4–10.5)
Chloride: 103 mEq/L (ref 96–112)
Creatinine, Ser: 0.88 mg/dL (ref 0.40–1.20)
GFR: 79.3 mL/min (ref >60.00)
Glucose, Bld: 88 mg/dL (ref 70–99)
Potassium: 4.1 mEq/L (ref 3.5–5.1)
Sodium: 137 mEq/L (ref 135–145)

## 2022-03-09 LAB — URINALYSIS, ROUTINE W REFLEX MICROSCOPIC
Bilirubin Urine: NEGATIVE
Ketones, ur: NEGATIVE
Leukocytes,Ua: NEGATIVE
Nitrite: NEGATIVE
Specific Gravity, Urine: 1.01 (ref 1.000–1.030)
Total Protein, Urine: NEGATIVE
Urine Glucose: NEGATIVE
Urobilinogen, UA: 0.2 (ref 0.0–1.0)
WBC, UA: NONE SEEN (ref 0–?)
pH: 5.5 (ref 5.0–8.0)

## 2022-03-09 LAB — LIPID PANEL
Cholesterol: 155 mg/dL (ref 0–200)
HDL: 52.8 mg/dL (ref >39.00)
LDL Cholesterol: 75 mg/dL (ref 0–99)
NonHDL: 102.43
Total CHOL/HDL Ratio: 3
Triglycerides: 136 mg/dL (ref 0.0–149.0)
VLDL: 27.2 mg/dL (ref 0.0–40.0)

## 2022-03-09 LAB — HEPATIC FUNCTION PANEL
ALT: 14 U/L (ref 0–35)
AST: 16 U/L (ref 0–37)
Albumin: 4.5 g/dL (ref 3.5–5.2)
Alkaline Phosphatase: 95 U/L (ref 39–117)
Bilirubin, Direct: 0.1 mg/dL (ref 0.0–0.3)
Total Bilirubin: 0.5 mg/dL (ref 0.2–1.2)
Total Protein: 6.9 g/dL (ref 6.0–8.3)

## 2022-03-09 LAB — VITAMIN D 25 HYDROXY (VIT D DEFICIENCY, FRACTURES): VITD: 53.63 ng/mL (ref 30.00–100.00)

## 2022-03-09 LAB — CBC WITH DIFFERENTIAL/PLATELET
Basophils Absolute: 0.1 10*3/uL (ref 0.0–0.1)
Basophils Relative: 1 % (ref 0.0–3.0)
Eosinophils Absolute: 0.2 10*3/uL (ref 0.0–0.7)
Eosinophils Relative: 2.4 % (ref 0.0–5.0)
HCT: 41 % (ref 36.0–46.0)
Hemoglobin: 14 g/dL (ref 12.0–15.0)
Lymphocytes Relative: 14.1 % (ref 12.0–46.0)
Lymphs Abs: 1.2 10*3/uL (ref 0.7–4.0)
MCHC: 34.2 g/dL (ref 30.0–36.0)
MCV: 88 fl (ref 78.0–100.0)
Monocytes Absolute: 0.7 10*3/uL (ref 0.1–1.0)
Monocytes Relative: 8.1 % (ref 3.0–12.0)
Neutro Abs: 6.5 10*3/uL (ref 1.4–7.7)
Neutrophils Relative %: 74.4 % (ref 43.0–77.0)
Platelets: 230 10*3/uL (ref 150.0–400.0)
RBC: 4.66 Mil/uL (ref 3.87–5.11)
RDW: 12.9 % (ref 11.5–15.5)
WBC: 8.7 10*3/uL (ref 4.0–10.5)

## 2022-03-09 LAB — TSH: TSH: 2.38 u[IU]/mL (ref 0.35–5.50)

## 2022-03-12 ENCOUNTER — Ambulatory Visit (INDEPENDENT_AMBULATORY_CARE_PROVIDER_SITE_OTHER): Payer: BC Managed Care – PPO | Admitting: Internal Medicine

## 2022-03-12 VITALS — BP 128/76 | HR 84 | Temp 98.0°F | Ht 68.75 in | Wt 243.0 lb

## 2022-03-12 DIAGNOSIS — E559 Vitamin D deficiency, unspecified: Secondary | ICD-10-CM | POA: Diagnosis not present

## 2022-03-12 DIAGNOSIS — Z0001 Encounter for general adult medical examination with abnormal findings: Secondary | ICD-10-CM | POA: Diagnosis not present

## 2022-03-12 DIAGNOSIS — E782 Mixed hyperlipidemia: Secondary | ICD-10-CM

## 2022-03-12 DIAGNOSIS — J4520 Mild intermittent asthma, uncomplicated: Secondary | ICD-10-CM | POA: Diagnosis not present

## 2022-03-12 DIAGNOSIS — I10 Essential (primary) hypertension: Secondary | ICD-10-CM | POA: Diagnosis not present

## 2022-03-12 MED ORDER — LOSARTAN POTASSIUM 50 MG PO TABS: 50.0000 mg | ORAL_TABLET | Freq: Every day | ORAL | 3 refills | Status: DC

## 2022-03-12 NOTE — Patient Instructions (Signed)
Please take all new medication as prescribed - the losartan 50 mg per day ? ?Please continue all other medications as before, and refills have been done if requested. ? ?Please have the pharmacy call with any other refills you may need. ? ?Please continue your efforts at being more active, low cholesterol diet, and weight control. ? ?You are otherwise up to date with prevention measures today. ? ?Please keep your appointments with your specialists as you may have planned ? ?Please make an Appointment to return in 6 months, or sooner if needed ?

## 2022-03-12 NOTE — Progress Notes (Signed)
Patient ID: Robin Ayers, female   DOB: [birth year], 46 y.o.   MRN: [id number] ? ? ? ?     Chief Complaint:: wellness exam and htn, hld, low vit d ? ?     HPI:  Robin Ayers is a 46 y.o. female here for wellness exam; declines hep c screen, colonoscopy, o/w up to date ?         ?              Also Pt denies chest pain, increased sob or doe, wheezing, orthopnea, PND, increased LE swelling, palpitations, dizziness or syncope.   Pt denies polydipsia, polyuria, or new focal neuro s/s.    Pt denies fever, wt loss, night sweats, loss of appetite, or other constitutional symptoms  BP at home has been 140-150 at home. Taking Vit d.  Trying to follow lower chol diet ?  ?Wt Readings from Last 3 Encounters:  ?03/12/22 243 lb (110.2 kg)  ?03/10/21 242 lb (109.8 kg)  ?01/16/21 254 lb 4.8 oz (115.3 kg)  ? ?BP Readings from Last 3 Encounters:  ?03/12/22 128/76  ?03/10/21 122/74  ?01/16/21 126/64  ? ?Immunization History  ?Administered Date(s) Administered  ? Influenza Split 09/09/2012  ? Influenza Whole 08/21/2010, 09/07/2019  ? Influenza,inj,Quad PF,6+ Mos 09/02/2016, 10/12/2018  ? Influenza-Unspecified 09/07/2015  ? PFIZER(Purple Top)SARS-COV-2 Vaccination 05/24/2020, 06/20/2020  ? Tdap 10/04/2017  ? ?There are no preventive care reminders to display for this patient. ? ?  ? ?Past Medical History:  ?Diagnosis Date  ? ALLERGIC RHINITIS 10/26/2007  ? ASTHMA 04/10/2010  ? Blood dyscrasia   ? factor V Leiden   ? Clotting disorder ([place])   ? DYSLIPIDEMIA 06/24/2009  ? Factor V Leiden mutation ([place])   ? hx of  ? Family history of anesthesia complication   ? PONV  ? Ovarian cyst   ? PONV (postoperative nausea and vomiting)   ? ?Past Surgical History:  ?Procedure Laterality Date  ? ABDOMINAL HYSTERECTOMY    ? BLADDER SUSPENSION  05/26/2012  ? Procedure: TRANSVAGINAL TAPE (TVT) PROCEDURE;  Surgeon: Daria Pastures, MD;  Location: [facility];  Service: Gynecology;  Laterality: N/A;  ? CYSTOCELE REPAIR  05/26/2012  ?  Procedure: ANTERIOR REPAIR (CYSTOCELE);  Surgeon: Daria Pastures, MD;  Location: [facility];  Service: Gynecology;  Laterality: N/A;  ? CYSTOSCOPY  05/26/2012  ? Procedure: CYSTOSCOPY;  Surgeon: Daria Pastures, MD;  Location: [facility];  Service: Gynecology;  Laterality: N/A;  ? LAPAROSCOPIC ASSISTED VAGINAL HYSTERECTOMY  05/26/2012  ? Procedure: LAPAROSCOPIC ASSISTED VAGINAL HYSTERECTOMY;  Surgeon: Gus Height, MD;  Location: [facility];  Service: Gynecology;  Laterality: N/A;  ? SALPINGOOPHORECTOMY  05/26/2012  ? Procedure: SALPINGO OOPHERECTOMY;  Surgeon: Gus Height, MD;  Location: [facility];  Service: Gynecology;  Laterality: Left;  ? TONSILLECTOMY AND ADENOIDECTOMY    ? WISDOM TOOTH EXTRACTION    ? ? reports that she has quit smoking. She has never used smokeless tobacco. She reports that she does not drink alcohol and does not use drugs. ?family history includes Diabetes in her mother and another family member; Heart attack (age of onset: 59) in her mother; Heart disease in her maternal grandfather and mother; Hypertension in her maternal grandfather, mother, and another family member. ?Allergies  ?Allergen Reactions  ? Clarithromycin   ?  REACTION: GI upset  ? Penicillins   ?  REACTION: rash  ? ?Current Outpatient Medications on File Prior to Visit  ?Medication Sig Dispense Refill  ?  aspirin 81 MG EC tablet Take 1 tablet (81 mg total) by mouth daily. Swallow whole. 30 tablet 12  ? cetirizine (ZYRTEC) 10 MG tablet Take 10 mg by mouth daily.    ? Diethylpropion HCl CR 75 MG TB24 Take by mouth.    ? Multiple Vitamin (MULTIVITAMIN) tablet Take 1 tablet by mouth daily.    ? Probiotic Product (PROBIOTIC PO) Take by mouth daily.    ? spironolactone (ALDACTONE) 50 MG tablet Take 50 mg by mouth daily.    ? ?No current facility-administered medications on file prior to visit.  ? ?     ROS:  All others reviewed and negative. ? ?Objective  ? ?     PE:  BP 128/76 (BP Location: Right Arm, Patient Position: Sitting, Cuff Size:  Normal)   Pulse 84   Temp 98 ?F (36.7 ?C) (Oral)   Ht 5' 8.75" (1.746 m)   Wt 243 lb (110.2 kg)   LMP 04/16/2012   SpO2 97%   BMI 36.15 kg/m?  ? ?              Constitutional: Pt appears in NAD ?              HENT: Head: NCAT.  ?              Right Ear: External ear normal.   ?              Left Ear: External ear normal.  ?              Eyes: . Pupils are equal, round, and reactive to light. Conjunctivae and EOM are normal ?              Nose: without d/c or deformity ?              Neck: Neck supple. Gross normal ROM ?              Cardiovascular: Normal rate and regular rhythm.   ?              Pulmonary/Chest: Effort normal and breath sounds without rales or wheezing.  ?              Abd:  Soft, NT, ND, + BS, no organomegaly ?              Neurological: Pt is alert. At baseline orientation, motor grossly intact ?              Skin: Skin is warm. No rashes, no other new lesions, LE edema - none ?              Psychiatric: Pt behavior is normal without agitation  ? ?Micro: none ? ?Cardiac tracings I have personally interpreted today:  none ? ?Pertinent Radiological findings (summarize): none  ? ?Lab Results  ?Component Value Date  ? WBC 8.7 03/09/2022  ? HGB 14.0 03/09/2022  ? HCT 41.0 03/09/2022  ? PLT 230.0 03/09/2022  ? GLUCOSE 88 03/09/2022  ? CHOL 155 03/09/2022  ? TRIG 136.0 03/09/2022  ? HDL 52.80 03/09/2022  ? LDLDIRECT 130.0 03/04/2020  ? [place] 75 03/09/2022  ? ALT 14 03/09/2022  ? AST 16 03/09/2022  ? NA 137 03/09/2022  ? K 4.1 03/09/2022  ? CL 103 03/09/2022  ? CREATININE 0.88 03/09/2022  ? BUN 17 03/09/2022  ? CO2 26 03/09/2022  ? TSH 2.38 03/09/2022  ? INR 0.87 05/12/2012  ? ?Assessment/Plan:  ?Robin  Davielle Ayers is a 46 y.o. White or Caucasian [1] female with  has a past medical history of ALLERGIC RHINITIS (10/26/2007), ASTHMA (04/10/2010), Blood dyscrasia, Clotting disorder ([place]), DYSLIPIDEMIA (06/24/2009), Factor V Leiden mutation ([place]), Family history of anesthesia complication,  Ovarian cyst, and PONV (postoperative nausea and vomiting). ? ?Encounter for well adult exam with abnormal findings ?Age and sex appropriate education and counseling updated with regular exercise and diet ?Referrals for preventative services - declines hep c screen, colonoscopy ?Immunizations addressed - none needed ?Smoking counseling  - none needed ?Evidence for depression or other mood disorder - none significant ?Most recent labs reviewed. ?I have personally reviewed and have noted: ?1) the patient's medical and social history ?2) The patient's current medications and supplements ?3) The patient's height, weight, and BMI have been recorded in the chart ? ? ?HTN (hypertension) ?BP Readings from Last 3 Encounters:  ?03/12/22 128/76  ?03/10/21 122/74  ?01/16/21 126/64  ? ?Stable here but uncontrolled at home, ok for losartan 50 qd new start,  to f/u any worsening symptoms or concerns ? ?Hyperlipidemia ?Lab Results  ?Component Value Date  ? [place] 75 03/09/2022  ? ?Stable, pt to continue current statin lipitor 40 ? ? ?Vitamin D deficiency ?Last vitamin D ?Lab Results  ?Component Value Date  ? VD25OH 53.63 03/09/2022  ? ?Stable, cont oral replacement ? ?Asthma ?Stable overall, continue inhalers ? ?Followup: Return in about 6 months (around 09/11/2022). ? ?Cathlean Cower, MD 03/13/2022 5:28 PM ?[place] ?[place] ?Internal Medicine ?

## 2022-03-13 ENCOUNTER — Encounter: Payer: Self-pay | Admitting: Internal Medicine

## 2022-03-13 DIAGNOSIS — I10 Essential (primary) hypertension: Secondary | ICD-10-CM | POA: Insufficient documentation

## 2022-03-13 MED ORDER — BUDESONIDE-FORMOTEROL FUMARATE 160-4.5 MCG/ACT IN AERO: INHALATION_SPRAY | RESPIRATORY_TRACT | 11 refills | Status: DC

## 2022-03-13 MED ORDER — ATORVASTATIN CALCIUM 40 MG PO TABS: 40.0000 mg | ORAL_TABLET | Freq: Every day | ORAL | 3 refills | Status: DC

## 2022-03-13 MED ORDER — MONTELUKAST SODIUM 10 MG PO TABS: ORAL_TABLET | ORAL | 3 refills | Status: DC

## 2022-03-13 MED ORDER — ALBUTEROL SULFATE HFA 108 (90 BASE) MCG/ACT IN AERS: INHALATION_SPRAY | RESPIRATORY_TRACT | 3 refills | Status: DC

## 2022-03-13 MED ORDER — RIZATRIPTAN BENZOATE 10 MG PO TBDP: 10.0000 mg | ORAL_TABLET | ORAL | 11 refills | Status: AC | PRN

## 2022-03-13 NOTE — Assessment & Plan Note (Signed)
Lab Results  ?Component Value Date  ? [place] 75 03/09/2022  ? ?Stable, pt to continue current statin lipitor 40 ? ?

## 2022-03-13 NOTE — Assessment & Plan Note (Signed)
BP Readings from Last 3 Encounters:  ?03/12/22 128/76  ?03/10/21 122/74  ?01/16/21 126/64  ? ?Stable here but uncontrolled at home, ok for losartan 50 qd new start,  to f/u any worsening symptoms or concerns ?

## 2022-03-13 NOTE — Assessment & Plan Note (Signed)
Last vitamin D Lab Results  Component Value Date   VD25OH 53.63 03/09/2022   Stable, cont oral replacement  

## 2022-03-13 NOTE — Assessment & Plan Note (Signed)
Age and sex appropriate education and counseling updated with regular exercise and diet ?Referrals for preventative services - declines hep c screen, colonoscopy ?Immunizations addressed - none needed ?Smoking counseling  - none needed ?Evidence for depression or other mood disorder - none significant ?Most recent labs reviewed. ?I have personally reviewed and have noted: ?1) the patient's medical and social history ?2) The patient's current medications and supplements ?3) The patient's height, weight, and BMI have been recorded in the chart ? ?

## 2022-03-13 NOTE — Assessment & Plan Note (Signed)
Stable overall, continue inhalers ?

## 2022-04-13 ENCOUNTER — Ambulatory Visit (INDEPENDENT_AMBULATORY_CARE_PROVIDER_SITE_OTHER): Payer: BC Managed Care – PPO | Admitting: Internal Medicine

## 2022-04-13 ENCOUNTER — Encounter: Payer: Self-pay | Admitting: Internal Medicine

## 2022-04-13 VITALS — BP 128/72 | HR 90 | Temp 97.8°F | Ht 68.75 in | Wt 242.0 lb

## 2022-04-13 DIAGNOSIS — E559 Vitamin D deficiency, unspecified: Secondary | ICD-10-CM

## 2022-04-13 DIAGNOSIS — Z6836 Body mass index (BMI) 36.0-36.9, adult: Secondary | ICD-10-CM

## 2022-04-13 DIAGNOSIS — J309 Allergic rhinitis, unspecified: Secondary | ICD-10-CM

## 2022-04-13 DIAGNOSIS — E6609 Other obesity due to excess calories: Secondary | ICD-10-CM

## 2022-04-13 DIAGNOSIS — J0101 Acute recurrent maxillary sinusitis: Secondary | ICD-10-CM | POA: Diagnosis not present

## 2022-04-13 DIAGNOSIS — I10 Essential (primary) hypertension: Secondary | ICD-10-CM

## 2022-04-13 MED ORDER — LEVOFLOXACIN 500 MG PO TABS: 500.0000 mg | ORAL_TABLET | Freq: Every day | ORAL | 0 refills | Status: AC

## 2022-04-13 MED ORDER — PREDNISONE 10 MG PO TABS: ORAL_TABLET | ORAL | 0 refills | Status: DC

## 2022-04-13 MED ORDER — WEGOVY 0.5 MG/0.5ML ~~LOC~~ SOAJ: 0.5000 mg | SUBCUTANEOUS | 3 refills | Status: DC

## 2022-04-13 NOTE — Progress Notes (Signed)
Patient ID: Robin Ayers, female   DOB: 05-23-1976, 46 y.o.   MRN: [id number] ? ? ? ?    Chief Complaint: follow up sinus pain, allergies, htn, low vit d ? ?     HPI:  Robin Ayers is a 46 y.o. female here with c/o  Here with 2-3 days acute onset fever, facial pain, pressure, headache, general weakness and malaise, and greenish d/c, with mild ST and cough, but pt denies chest pain, wheezing, increased sob or doe, orthopnea, PND, increased LE swelling, palpitations, dizziness or syncope.  Does have several wks ongoing nasal allergy symptoms with clearish congestion, itch and sneezing, without fever, pain, ST, cough, swelling or wheezing.  Taking Vit D.  Pt is interested in wegovy for wt loss. ?      ?Wt Readings from Last 3 Encounters:  ?04/13/22 242 lb (109.8 kg)  ?03/12/22 243 lb (110.2 kg)  ?03/10/21 242 lb (109.8 kg)  ? ?BP Readings from Last 3 Encounters:  ?04/13/22 128/72  ?03/12/22 128/76  ?03/10/21 122/74  ? ?      ?Past Medical History:  ?Diagnosis Date  ? ALLERGIC RHINITIS 10/26/2007  ? ASTHMA 04/10/2010  ? Blood dyscrasia   ? factor V Leiden   ? Clotting disorder ([place])   ? DYSLIPIDEMIA 06/24/2009  ? Factor V Leiden mutation ([place])   ? hx of  ? Family history of anesthesia complication   ? PONV  ? Ovarian cyst   ? PONV (postoperative nausea and vomiting)   ? ?Past Surgical History:  ?Procedure Laterality Date  ? ABDOMINAL HYSTERECTOMY    ? BLADDER SUSPENSION  05/26/2012  ? Procedure: TRANSVAGINAL TAPE (TVT) PROCEDURE;  Surgeon: Daria Pastures, MD;  Location: [facility];  Service: Gynecology;  Laterality: N/A;  ? CYSTOCELE REPAIR  05/26/2012  ? Procedure: ANTERIOR REPAIR (CYSTOCELE);  Surgeon: Daria Pastures, MD;  Location: [facility];  Service: Gynecology;  Laterality: N/A;  ? CYSTOSCOPY  05/26/2012  ? Procedure: CYSTOSCOPY;  Surgeon: Daria Pastures, MD;  Location: [facility];  Service: Gynecology;  Laterality: N/A;  ? LAPAROSCOPIC ASSISTED VAGINAL HYSTERECTOMY  05/26/2012  ? Procedure:  LAPAROSCOPIC ASSISTED VAGINAL HYSTERECTOMY;  Surgeon: Gus Height, MD;  Location: [facility];  Service: Gynecology;  Laterality: N/A;  ? SALPINGOOPHORECTOMY  05/26/2012  ? Procedure: SALPINGO OOPHERECTOMY;  Surgeon: Gus Height, MD;  Location: [facility];  Service: Gynecology;  Laterality: Left;  ? TONSILLECTOMY AND ADENOIDECTOMY    ? WISDOM TOOTH EXTRACTION    ? ? reports that she has quit smoking. She has never used smokeless tobacco. She reports that she does not drink alcohol and does not use drugs. ?family history includes Diabetes in her mother and another family member; Heart attack (age of onset: 34) in her mother; Heart disease in her maternal grandfather and mother; Hypertension in her maternal grandfather, mother, and another family member. ?Allergies  ?Allergen Reactions  ? Clarithromycin   ?  REACTION: GI upset  ? Penicillins   ?  REACTION: rash  ? ?Current Outpatient Medications on File Prior to Visit  ?Medication Sig Dispense Refill  ? albuterol (PROAIR HFA) 108 (90 Base) MCG/ACT inhaler TAKE 2 PUFFS BY MOUTH EVERY 6 HOURS AS NEEDED FOR WHEEZE OR SHORTNESS OF BREATH 25.5 each 3  ? aspirin 81 MG EC tablet Take 1 tablet (81 mg total) by mouth daily. Swallow whole. 30 tablet 12  ? atorvastatin (LIPITOR) 40 MG tablet Take 1 tablet (40 mg total) by mouth daily. 90 tablet 3  ?  budesonide-formoterol (SYMBICORT) 160-4.5 MCG/ACT inhaler TAKE 2 PUFFS BY MOUTH TWICE A DAY 30.6 each 11  ? cetirizine (ZYRTEC) 10 MG tablet Take 10 mg by mouth daily.    ? Diethylpropion HCl CR 75 MG TB24 Take by mouth.    ? losartan (COZAAR) 50 MG tablet Take 1 tablet (50 mg total) by mouth daily. 90 tablet 3  ? montelukast (SINGULAIR) 10 MG tablet TAKE 1 TABLET BY MOUTH EVERYDAY AT BEDTIME 90 tablet 3  ? Multiple Vitamin (MULTIVITAMIN) tablet Take 1 tablet by mouth daily.    ? Probiotic Product (PROBIOTIC PO) Take by mouth daily.    ? rizatriptan (MAXALT-MLT) 10 MG disintegrating tablet Take 1 tablet (10 mg total) by mouth as needed for  migraine. May repeat in 2 hours if needed 10 tablet 11  ? spironolactone (ALDACTONE) 50 MG tablet Take 50 mg by mouth daily.    ? ?No current facility-administered medications on file prior to visit.  ? ?     ROS:  All others reviewed and negative. ? ?Objective  ? ?     PE:  BP 128/72 (BP Location: Left Arm, Patient Position: Sitting, Cuff Size: Large)   Pulse 90   Temp 97.8 ?F (36.6 ?C) (Oral)   Ht 5' 8.75" (1.746 m)   Wt 242 lb (109.8 kg)   LMP 04/16/2012   SpO2 98%   BMI 36.00 kg/m?  ? ?              Constitutional: Pt appears in NAD ?              HENT: Head: NCAT.  ?              Right Ear: External ear normal.   ?              Left Ear: External ear normal. Bilat tm's with mild erythema.  Max sinus areas mild tender.  Pharynx with mild erythema, no exudate ?              Eyes: . Pupils are equal, round, and reactive to light. Conjunctivae and EOM are normal ?              Nose: without d/c or deformity ?              Neck: Neck supple. Gross normal ROM ?              Cardiovascular: Normal rate and regular rhythm.   ?              Pulmonary/Chest: Effort normal and breath sounds without rales or wheezing.  ?              Abd:  Soft, NT, ND, + BS, no organomegaly ?              Neurological: Pt is alert. At baseline orientation, motor grossly intact ?              Skin: Skin is warm. No rashes, no other new lesions, LE edema - none ?              Psychiatric: Pt behavior is normal without agitation  ? ?Micro: none ? ?Cardiac tracings I have personally interpreted today:  none ? ?Pertinent Radiological findings (summarize): none  ? ?Lab Results  ?Component Value Date  ? WBC 8.7 03/09/2022  ? HGB 14.0 03/09/2022  ? HCT 41.0 03/09/2022  ? PLT 230.0 03/09/2022  ? GLUCOSE 88  03/09/2022  ? CHOL 155 03/09/2022  ? TRIG 136.0 03/09/2022  ? HDL 52.80 03/09/2022  ? LDLDIRECT 130.0 03/04/2020  ? [place] 75 03/09/2022  ? ALT 14 03/09/2022  ? AST 16 03/09/2022  ? NA 137 03/09/2022  ? K 4.1 03/09/2022  ? CL 103  03/09/2022  ? CREATININE 0.88 03/09/2022  ? BUN 17 03/09/2022  ? CO2 26 03/09/2022  ? TSH 2.38 03/09/2022  ? INR 0.87 05/12/2012  ? ?Assessment/Plan:  ?Nalanie Winiecki is a 46 y.o. White or Caucasian [1] female with  has a past medical history of ALLERGIC RHINITIS (10/26/2007), ASTHMA (04/10/2010), Blood dyscrasia, Clotting disorder ([place]), DYSLIPIDEMIA (06/24/2009), Factor V Leiden mutation ([place]), Family history of anesthesia complication, Ovarian cyst, and PONV (postoperative nausea and vomiting). ? ?Vitamin D deficiency ?Last vitamin D ?Lab Results  ?Component Value Date  ? VD25OH 53.63 03/09/2022  ? ?Stable, cont oral replacement ? ? ?Sinusitis ?Mild to mod, for antibx course,  to f/u any worsening symptoms or concerns ? ?Allergic rhinitis ?Mild to mod, for prednisone,  to f/u any worsening symptoms or concerns ? ?HTN (hypertension) ?BP Readings from Last 3 Encounters:  ?04/13/22 128/72  ?03/12/22 128/76  ?03/10/21 122/74  ? ?Stable, pt to continue medical treatment losrtan ? ? ?Obesity ?For wegovy for wt loss ? ?Followup: Return if symptoms worsen or fail to improve. ? ?Cathlean Cower, MD 04/16/2022 7:49 PM ?[place] ?[place] ?Internal Medicine ?

## 2022-04-13 NOTE — Patient Instructions (Signed)
Please take all new medication as prescribed- the antibiotic, and prednisone, and wegovy ? ?Please continue all other medications as before, and refills have been done if requested. ? ?Please have the pharmacy call with any other refills you may need. ? ?Please continue your efforts at being more active, low cholesterol diet, and weight control. ? ?Please keep your appointments with your specialists as you may have planned ? ? ? ?

## 2022-04-16 ENCOUNTER — Encounter: Payer: Self-pay | Admitting: Internal Medicine

## 2022-04-16 DIAGNOSIS — E669 Obesity, unspecified: Secondary | ICD-10-CM | POA: Insufficient documentation

## 2022-04-16 NOTE — Assessment & Plan Note (Signed)
Mild to mod, for prednisone,  to f/u any worsening symptoms or concerns ?

## 2022-04-16 NOTE — Assessment & Plan Note (Signed)
Mild to mod, for antibx course,  to f/u any worsening symptoms or concerns 

## 2022-04-16 NOTE — Assessment & Plan Note (Signed)
BP Readings from Last 3 Encounters:  ?04/13/22 128/72  ?03/12/22 128/76  ?03/10/21 122/74  ? ?Stable, pt to continue medical treatment losrtan ? ?

## 2022-04-16 NOTE — Assessment & Plan Note (Signed)
Last vitamin D Lab Results  Component Value Date   VD25OH 53.63 03/09/2022   Stable, cont oral replacement  

## 2022-04-16 NOTE — Assessment & Plan Note (Signed)
For wegovy for wt loss ?

## 2022-09-11 ENCOUNTER — Ambulatory Visit (INDEPENDENT_AMBULATORY_CARE_PROVIDER_SITE_OTHER): Payer: BC Managed Care – PPO | Admitting: Internal Medicine

## 2022-09-11 ENCOUNTER — Ambulatory Visit: Payer: BC Managed Care – PPO | Admitting: Internal Medicine

## 2022-09-11 ENCOUNTER — Encounter: Payer: Self-pay | Admitting: Internal Medicine

## 2022-09-11 VITALS — BP 124/70 | HR 84 | Temp 98.0°F | Ht 68.0 in | Wt 250.0 lb

## 2022-09-11 DIAGNOSIS — E559 Vitamin D deficiency, unspecified: Secondary | ICD-10-CM

## 2022-09-11 DIAGNOSIS — Z6836 Body mass index (BMI) 36.0-36.9, adult: Secondary | ICD-10-CM

## 2022-09-11 DIAGNOSIS — E6609 Other obesity due to excess calories: Secondary | ICD-10-CM | POA: Diagnosis not present

## 2022-09-11 DIAGNOSIS — M2550 Pain in unspecified joint: Secondary | ICD-10-CM | POA: Diagnosis not present

## 2022-09-11 DIAGNOSIS — I10 Essential (primary) hypertension: Secondary | ICD-10-CM | POA: Diagnosis not present

## 2022-09-11 LAB — HEMOGLOBIN A1C: Hgb A1c MFr Bld: 5.8 % (ref 4.6–6.5)

## 2022-09-11 LAB — CBC WITH DIFFERENTIAL/PLATELET
Basophils Absolute: 0.1 10*3/uL (ref 0.0–0.1)
Basophils Relative: 0.7 % (ref 0.0–3.0)
Eosinophils Absolute: 0.1 10*3/uL (ref 0.0–0.7)
Eosinophils Relative: 1.7 % (ref 0.0–5.0)
HCT: 41.8 % (ref 36.0–46.0)
Hemoglobin: 14 g/dL (ref 12.0–15.0)
Lymphocytes Relative: 12.4 % (ref 12.0–46.0)
Lymphs Abs: 1 10*3/uL (ref 0.7–4.0)
MCHC: 33.4 g/dL (ref 30.0–36.0)
MCV: 88.6 fl (ref 78.0–100.0)
Monocytes Absolute: 0.6 10*3/uL (ref 0.1–1.0)
Monocytes Relative: 7.4 % (ref 3.0–12.0)
Neutro Abs: 6.5 10*3/uL (ref 1.4–7.7)
Neutrophils Relative %: 77.8 % — ABNORMAL HIGH (ref 43.0–77.0)
Platelets: 233 10*3/uL (ref 150.0–400.0)
RBC: 4.72 Mil/uL (ref 3.87–5.11)
RDW: 13.4 % (ref 11.5–15.5)
WBC: 8.4 10*3/uL (ref 4.0–10.5)

## 2022-09-11 LAB — BASIC METABOLIC PANEL
BUN: 23 mg/dL (ref 6–23)
CO2: 27 mEq/L (ref 19–32)
Calcium: 9.6 mg/dL (ref 8.4–10.5)
Chloride: 103 mEq/L (ref 96–112)
Creatinine, Ser: 0.86 mg/dL (ref 0.40–1.20)
GFR: 81.23 mL/min (ref >60.00)
Glucose, Bld: 95 mg/dL (ref 70–99)
Potassium: 4 mEq/L (ref 3.5–5.1)
Sodium: 138 mEq/L (ref 135–145)

## 2022-09-11 LAB — HEPATIC FUNCTION PANEL
ALT: 14 U/L (ref 0–35)
AST: 15 U/L (ref 0–37)
Albumin: 4.5 g/dL (ref 3.5–5.2)
Alkaline Phosphatase: 96 U/L (ref 39–117)
Bilirubin, Direct: 0.1 mg/dL (ref 0.0–0.3)
Total Bilirubin: 0.5 mg/dL (ref 0.2–1.2)
Total Protein: 7.1 g/dL (ref 6.0–8.3)

## 2022-09-11 LAB — LIPID PANEL
Cholesterol: 153 mg/dL (ref 0–200)
HDL: 50.2 mg/dL (ref >39.00)
LDL Cholesterol: 79 mg/dL (ref 0–99)
NonHDL: 102.72
Total CHOL/HDL Ratio: 3
Triglycerides: 121 mg/dL (ref 0.0–149.0)
VLDL: 24.2 mg/dL (ref 0.0–40.0)

## 2022-09-11 LAB — SEDIMENTATION RATE: Sed Rate: 19 mm/hr (ref 0–20)

## 2022-09-11 LAB — C-REACTIVE PROTEIN: CRP: <1 mg/dL (ref 0.5–20.0)

## 2022-09-11 NOTE — Assessment & Plan Note (Signed)
Last vitamin D Lab Results  Component Value Date   VD25OH 53.63 03/09/2022   Stable, cont oral replacement

## 2022-09-11 NOTE — Assessment & Plan Note (Signed)
BP Readings from Last 3 Encounters:  09/11/22 124/70  04/13/22 128/72  03/12/22 128/76   Stable, pt to continue medical treatment losartan 50 mg qd

## 2022-09-11 NOTE — Progress Notes (Signed)
Patient ID: Robin Ayers, female   DOB: 1976/09/25, 46 y.o.   MRN: [id number]        Chief Complaint: follow up HTN, polyarthralgia and FH lupus       HPI:  Robin Ayers is a 46 y.o. female here with c/o 4 first degree relatives with hx of Lupus including her mother, now with c/o recent 2-3 mo worsening polyarthralgias without swelling, Pt denies chest pain, increased sob or doe, wheezing, orthopnea, PND, increased LE swelling, palpitations, dizziness or syncope.   Pt denies polydipsia, polyuria, or new focal neuro s/s.    Pt denies fever, wt loss, night sweats, loss of appetite, or other constitutional symptoms, but does also have difficulty losing wt (wegovy denied per insurance), hair thinning.  Due for flu shot       Wt Readings from Last 3 Encounters:  09/11/22 250 lb (113.4 kg)  04/13/22 242 lb (109.8 kg)  03/12/22 243 lb (110.2 kg)   BP Readings from Last 3 Encounters:  09/11/22 124/70  04/13/22 128/72  03/12/22 128/76         Past Medical History:  Diagnosis Date   ALLERGIC RHINITIS 10/26/2007   ASTHMA 04/10/2010   Blood dyscrasia    factor V Leiden    Clotting disorder ([place])    DYSLIPIDEMIA 06/24/2009   Factor V Leiden mutation ([place])    hx of   Family history of anesthesia complication    PONV   Ovarian cyst    PONV (postoperative nausea and vomiting)    Past Surgical History:  Procedure Laterality Date   ABDOMINAL HYSTERECTOMY     BLADDER SUSPENSION  05/26/2012   Procedure: TRANSVAGINAL TAPE (TVT) PROCEDURE;  Surgeon: Daria Pastures, MD;  Location: [facility];  Service: Gynecology;  Laterality: N/A;   CYSTOCELE REPAIR  05/26/2012   Procedure: ANTERIOR REPAIR (CYSTOCELE);  Surgeon: Daria Pastures, MD;  Location: [facility];  Service: Gynecology;  Laterality: N/A;   CYSTOSCOPY  05/26/2012   Procedure: CYSTOSCOPY;  Surgeon: Daria Pastures, MD;  Location: [facility];  Service: Gynecology;  Laterality: N/A;   LAPAROSCOPIC ASSISTED VAGINAL  HYSTERECTOMY  05/26/2012   Procedure: LAPAROSCOPIC ASSISTED VAGINAL HYSTERECTOMY;  Surgeon: Gus Height, MD;  Location: [facility];  Service: Gynecology;  Laterality: N/A;   SALPINGOOPHORECTOMY  05/26/2012   Procedure: SALPINGO OOPHERECTOMY;  Surgeon: Gus Height, MD;  Location: [facility];  Service: Gynecology;  Laterality: Left;   TONSILLECTOMY AND ADENOIDECTOMY     WISDOM TOOTH EXTRACTION      reports that she has quit smoking. She has never used smokeless tobacco. She reports that she does not drink alcohol and does not use drugs. family history includes Diabetes in her mother and another family member; Heart attack (age of onset: 76) in her mother; Heart disease in her maternal grandfather and mother; Hypertension in her maternal grandfather, mother, and another family member. Allergies  Allergen Reactions   Clarithromycin     REACTION: GI upset   Penicillins     REACTION: rash   Current Outpatient Medications on File Prior to Visit  Medication Sig Dispense Refill   albuterol (PROAIR HFA) 108 (90 Base) MCG/ACT inhaler TAKE 2 PUFFS BY MOUTH EVERY 6 HOURS AS NEEDED FOR WHEEZE OR SHORTNESS OF BREATH 25.5 each 3   aspirin 81 MG EC tablet Take 1 tablet (81 mg total) by mouth daily. Swallow whole. 30 tablet 12   atorvastatin (LIPITOR) 40 MG tablet Take 1 tablet (40 mg total) by mouth daily.  90 tablet 3   budesonide-formoterol (SYMBICORT) 160-4.5 MCG/ACT inhaler TAKE 2 PUFFS BY MOUTH TWICE A DAY 30.6 each 11   cetirizine (ZYRTEC) 10 MG tablet Take 10 mg by mouth daily.     Diethylpropion HCl CR 75 MG TB24 Take by mouth.     losartan (COZAAR) 50 MG tablet Take 1 tablet (50 mg total) by mouth daily. 90 tablet 3   montelukast (SINGULAIR) 10 MG tablet TAKE 1 TABLET BY MOUTH EVERYDAY AT BEDTIME 90 tablet 3   Multiple Vitamin (MULTIVITAMIN) tablet Take 1 tablet by mouth daily.     Probiotic Product (PROBIOTIC PO) Take by mouth daily.     rizatriptan (MAXALT-MLT) 10 MG disintegrating tablet Take 1 tablet (10  mg total) by mouth as needed for migraine. May repeat in 2 hours if needed 10 tablet 11   spironolactone (ALDACTONE) 50 MG tablet Take 50 mg by mouth daily.     No current facility-administered medications on file prior to visit.        ROS:  All others reviewed and negative.  Objective        PE:  BP 124/70 (BP Location: Left Arm, Patient Position: Sitting, Cuff Size: Large)   Pulse 84   Temp 98 F (36.7 C) (Oral)   Ht '5\' 8"'$  (1.727 m)   Wt 250 lb (113.4 kg)   LMP 04/16/2012   SpO2 95%   BMI 38.01 kg/m                 Constitutional: Pt appears in NAD               HENT: Head: NCAT.                Right Ear: External ear normal.                 Left Ear: External ear normal.                Eyes: . Pupils are equal, round, and reactive to light. Conjunctivae and EOM are normal               Nose: without d/c or deformity               Neck: Neck supple. Gross normal ROM               Cardiovascular: Normal rate and regular rhythm.                 Pulmonary/Chest: Effort normal and breath sounds without rales or wheezing.                Abd:  Soft, NT, ND, + BS, no organomegaly               Neurological: Pt is alert. At baseline orientation, motor grossly intact               Skin: Skin is warm. No rashes, no other new lesions, LE edema - Ayers               Psychiatric: Pt behavior is normal without agitation   Micro: Ayers  Cardiac tracings I have personally interpreted today:  Ayers  Pertinent Radiological findings (summarize): Ayers   Lab Results  Component Value Date   WBC 8.7 03/09/2022   HGB 14.0 03/09/2022   HCT 41.0 03/09/2022   PLT 230.0 03/09/2022   GLUCOSE 88 03/09/2022   CHOL 155 03/09/2022   TRIG 136.0 03/09/2022  HDL 52.80 03/09/2022   LDLDIRECT 130.0 03/04/2020   LDLCALC 75 03/09/2022   ALT 14 03/09/2022   AST 16 03/09/2022   NA 137 03/09/2022   K 4.1 03/09/2022   CL 103 03/09/2022   CREATININE 0.88 03/09/2022   BUN 17 03/09/2022   CO2 26  03/09/2022   TSH 2.38 03/09/2022   INR 0.87 05/12/2012   Assessment/Plan:  Robin Ayers is a 46 y.o. White or Caucasian [1] female with  has a past medical history of ALLERGIC RHINITIS (10/26/2007), ASTHMA (04/10/2010), Blood dyscrasia, Clotting disorder ([place]), DYSLIPIDEMIA (06/24/2009), Factor V Leiden mutation ([place]), Family history of anesthesia complication, Ovarian cyst, and PONV (postoperative nausea and vomiting).  Vitamin D deficiency Last vitamin D Lab Results  Component Value Date   VD25OH 53.63 03/09/2022   Stable, cont oral replacement   HTN (hypertension) BP Readings from Last 3 Encounters:  09/11/22 124/70  04/13/22 128/72  03/12/22 128/76   Stable, pt to continue medical treatment losartan 50 mg qd   Polyarthralgia With recent worsening, for ANA, and labs as ordered, consider rheum referral for abnormal  Obesity Pt has no hx of DM, wegovy was denied per insurance, so pt to continue to work on diet, activity  Followup: Return in about 6 months (around 03/13/2023).  Cathlean Cower, MD 09/11/2022 4:42 PM [facility]

## 2022-09-11 NOTE — Assessment & Plan Note (Signed)
With recent worsening, for ANA, and labs as ordered, consider rheum referral for abnormal

## 2022-09-11 NOTE — Assessment & Plan Note (Signed)
Pt has no hx of DM, wegovy was denied per insurance, so pt to continue to work on diet, activity

## 2022-09-11 NOTE — Patient Instructions (Addendum)
You had the flu shot today  Please continue all other medications as before, and refills have been done if requested.  Please have the pharmacy call with any other refills you may need.  Please continue your efforts at being more active, low cholesterol diet, and weight control.  Please keep your appointments with your specialists as you may have planned  Please go to the LAB at the blood drawing area for the tests to be done  You will be contacted by phone if any changes need to be made immediately.  Otherwise, you will receive a letter about your results with an explanation, but please check with MyChart first.  Please remember to sign up for MyChart if you have not done so, as this will be important to you in the future with finding out test results, communicating by private email, and scheduling acute appointments online when needed.  Please make an Appointment to return in 6 months, or sooner if needed, also with Lab Appointment for testing done 3-5 days before at the [facility] (so this is for TWO appointments - please see the scheduling desk as you leave)

## 2022-09-13 LAB — RHEUMATOID FACTOR: Rheumatoid fact SerPl-aCnc: <14 IU/mL (ref <14)

## 2022-09-13 LAB — ANA: Anti Nuclear Antibody (ANA): POSITIVE — AB

## 2022-09-13 LAB — ANTI-NUCLEAR AB-TITER (ANA TITER): ANA Titer 1: 1:80 {titer} — ABNORMAL HIGH

## 2022-09-13 LAB — ANTI-DNA ANTIBODY, DOUBLE-STRANDED: ds DNA Ab: <1 IU/mL

## 2022-09-14 LAB — VITAMIN D 25 HYDROXY (VIT D DEFICIENCY, FRACTURES): VITD: 58.98 ng/mL (ref 30.00–100.00)

## 2022-09-16 ENCOUNTER — Encounter: Payer: Self-pay | Admitting: Internal Medicine

## 2022-09-16 DIAGNOSIS — R768 Other specified abnormal immunological findings in serum: Secondary | ICD-10-CM

## 2022-10-16 ENCOUNTER — Other Ambulatory Visit: Payer: Self-pay | Admitting: Internal Medicine

## 2022-10-16 DIAGNOSIS — Z1231 Encounter for screening mammogram for malignant neoplasm of breast: Secondary | ICD-10-CM

## 2022-11-26 ENCOUNTER — Other Ambulatory Visit: Payer: Self-pay | Admitting: Internal Medicine

## 2022-11-26 ENCOUNTER — Ambulatory Visit (INDEPENDENT_AMBULATORY_CARE_PROVIDER_SITE_OTHER): Payer: BC Managed Care – PPO | Admitting: Internal Medicine

## 2022-11-26 ENCOUNTER — Encounter: Payer: Self-pay | Admitting: Internal Medicine

## 2022-11-26 VITALS — BP 120/80 | HR 98 | Temp 98.3°F | Ht 68.0 in | Wt 250.0 lb

## 2022-11-26 DIAGNOSIS — I10 Essential (primary) hypertension: Secondary | ICD-10-CM

## 2022-11-26 DIAGNOSIS — J0101 Acute recurrent maxillary sinusitis: Secondary | ICD-10-CM | POA: Diagnosis not present

## 2022-11-26 DIAGNOSIS — R062 Wheezing: Secondary | ICD-10-CM | POA: Diagnosis not present

## 2022-11-26 DIAGNOSIS — R0981 Nasal congestion: Secondary | ICD-10-CM

## 2022-11-26 LAB — POC SOFIA SARS ANTIGEN FIA: SARS Coronavirus 2 Ag: NEGATIVE

## 2022-11-26 LAB — POC INFLUENZA A&B (BINAX/QUICKVUE)
Influenza A, POC: NEGATIVE
Influenza B, POC: NEGATIVE

## 2022-11-26 LAB — POCT RESPIRATORY SYNCYTIAL VIRUS: RSV Rapid Ag: NEGATIVE

## 2022-11-26 MED ORDER — HYDROCODONE BIT-HOMATROP MBR 5-1.5 MG/5ML PO SOLN: 5.0000 mL | Freq: Four times a day (QID) | ORAL | 0 refills | Status: DC | PRN

## 2022-11-26 MED ORDER — PREDNISONE 10 MG PO TABS: ORAL_TABLET | ORAL | 0 refills | Status: DC

## 2022-11-26 MED ORDER — LEVOFLOXACIN 500 MG PO TABS: 500.0000 mg | ORAL_TABLET | Freq: Every day | ORAL | 0 refills | Status: AC

## 2022-11-26 MED ORDER — FLUTICASONE-SALMETEROL 250-50 MCG/ACT IN AEPB: 1.0000 | INHALATION_SPRAY | Freq: Two times a day (BID) | RESPIRATORY_TRACT | 3 refills | Status: DC

## 2022-11-26 NOTE — Progress Notes (Signed)
Patient ID: Robin Ayers, female   DOB: Mar 14, 1976, 46 y.o.   MRN: [id number]        Chief Complaint: follow up sinus pain x 2 wks       HPI:  Robin Ayers is a 46 y.o. female  Here with 2 wks acute onset fever, facial pain, pressure, headache, general weakness and malaise, and greenish d/c, with mild ST and cough, but pt denies chest pain, orthopnea, PND, increased LE swelling, palpitations, dizziness or syncope, but has mild wheezing sob doe in the past 2-3 days.   Pt denies polydipsia, polyuria, or new focal neuro s/s.    Pt denies fever, wt loss, night sweats, loss of appetite, or other constitutional symptoms  Symbicort no longer covered by insurance.         Wt Readings from Last 3 Encounters:  11/26/22 250 lb (113.4 kg)  09/11/22 250 lb (113.4 kg)  04/13/22 242 lb (109.8 kg)   BP Readings from Last 3 Encounters:  11/26/22 120/80  09/11/22 124/70  04/13/22 128/72         Past Medical History:  Diagnosis Date   ALLERGIC RHINITIS 10/26/2007   ASTHMA 04/10/2010   Blood dyscrasia    factor V Leiden    Clotting disorder ([place])    DYSLIPIDEMIA 06/24/2009   Factor V Leiden mutation ([place])    hx of   Family history of anesthesia complication    PONV   Ovarian cyst    PONV (postoperative nausea and vomiting)    Past Surgical History:  Procedure Laterality Date   ABDOMINAL HYSTERECTOMY     BLADDER SUSPENSION  05/26/2012   Procedure: TRANSVAGINAL TAPE (TVT) PROCEDURE;  Surgeon: Daria Pastures, MD;  Location: [facility];  Service: Gynecology;  Laterality: N/A;   CYSTOCELE REPAIR  05/26/2012   Procedure: ANTERIOR REPAIR (CYSTOCELE);  Surgeon: Daria Pastures, MD;  Location: [facility];  Service: Gynecology;  Laterality: N/A;   CYSTOSCOPY  05/26/2012   Procedure: CYSTOSCOPY;  Surgeon: Daria Pastures, MD;  Location: [facility];  Service: Gynecology;  Laterality: N/A;   LAPAROSCOPIC ASSISTED VAGINAL HYSTERECTOMY  05/26/2012   Procedure: LAPAROSCOPIC ASSISTED VAGINAL  HYSTERECTOMY;  Surgeon: Gus Height, MD;  Location: [facility];  Service: Gynecology;  Laterality: N/A;   SALPINGOOPHORECTOMY  05/26/2012   Procedure: SALPINGO OOPHERECTOMY;  Surgeon: Gus Height, MD;  Location: [facility];  Service: Gynecology;  Laterality: Left;   TONSILLECTOMY AND ADENOIDECTOMY     WISDOM TOOTH EXTRACTION      reports that she has quit smoking. She has never used smokeless tobacco. She reports that she does not drink alcohol and does not use drugs. family history includes Diabetes in her mother and another family member; Heart attack (age of onset: 38) in her mother; Heart disease in her maternal grandfather and mother; Hypertension in her maternal grandfather, mother, and another family member. Allergies  Allergen Reactions   Clarithromycin     REACTION: GI upset   Penicillins     REACTION: rash   Current Outpatient Medications on File Prior to Visit  Medication Sig Dispense Refill   albuterol (PROAIR HFA) 108 (90 Base) MCG/ACT inhaler TAKE 2 PUFFS BY MOUTH EVERY 6 HOURS AS NEEDED FOR WHEEZE OR SHORTNESS OF BREATH 25.5 each 3   aspirin 81 MG EC tablet Take 1 tablet (81 mg total) by mouth daily. Swallow whole. 30 tablet 12   atorvastatin (LIPITOR) 40 MG tablet Take 1 tablet (40 mg total) by mouth daily. 90 tablet  3   budesonide-formoterol (SYMBICORT) 160-4.5 MCG/ACT inhaler TAKE 2 PUFFS BY MOUTH TWICE A DAY 30.6 each 11   cetirizine (ZYRTEC) 10 MG tablet Take 10 mg by mouth daily.     Diethylpropion HCl CR 75 MG TB24 Take by mouth.     losartan (COZAAR) 50 MG tablet Take 1 tablet (50 mg total) by mouth daily. 90 tablet 3   montelukast (SINGULAIR) 10 MG tablet TAKE 1 TABLET BY MOUTH EVERYDAY AT BEDTIME 90 tablet 3   Multiple Vitamin (MULTIVITAMIN) tablet Take 1 tablet by mouth daily.     Probiotic Product (PROBIOTIC PO) Take by mouth daily.     rizatriptan (MAXALT-MLT) 10 MG disintegrating tablet Take 1 tablet (10 mg total) by mouth as needed for migraine. May repeat in 2 hours if  needed 10 tablet 11   spironolactone (ALDACTONE) 50 MG tablet Take 50 mg by mouth daily.     No current facility-administered medications on file prior to visit.        ROS:  All others reviewed and negative.  Objective        PE:  BP 120/80 (BP Location: Right Arm, Patient Position: Sitting, Cuff Size: Large)   Pulse 98   Temp 98.3 F (36.8 C) (Oral)   Ht '5\' 8"'$  (1.727 m)   Wt 250 lb (113.4 kg)   LMP 04/16/2012   SpO2 97%   BMI 38.01 kg/m                 Constitutional: Pt appears in NAD, mild ill               HENT: Head: NCAT.                Right Ear: External ear normal.                 Left Ear: External ear normal.   Bilat tm's with mild erythema.  Max sinus areas mild tender.  Pharynx with mild erythema, no exudate                Eyes: . Pupils are equal, round, and reactive to light. Conjunctivae and EOM are normal               Nose: without d/c or deformity               Neck: Neck supple. Gross normal ROM               Cardiovascular: Normal rate and regular rhythm.                 Pulmonary/Chest: Effort normal and breath sounds without rales or wheezing.                               Neurological: Pt is alert. At baseline orientation, motor grossly intact               Skin: Skin is warm. No rashes, no other new lesions, LE edema - none               Psychiatric: Pt behavior is normal without agitation   Micro: none  Cardiac tracings I have personally interpreted today:  none  Pertinent Radiological findings (summarize): none   Lab Results  Component Value Date   WBC 8.4 09/11/2022   HGB 14.0 09/11/2022   HCT 41.8 09/11/2022   PLT 233.0 09/11/2022   GLUCOSE 95  09/11/2022   CHOL 153 09/11/2022   TRIG 121.0 09/11/2022   HDL 50.20 09/11/2022   LDLDIRECT 130.0 03/04/2020   LDLCALC 79 09/11/2022   ALT 14 09/11/2022   AST 15 09/11/2022   NA 138 09/11/2022   K 4.0 09/11/2022   CL 103 09/11/2022   CREATININE 0.86 09/11/2022   BUN 23 09/11/2022   CO2 27  09/11/2022   TSH 2.38 03/09/2022   INR 0.87 05/12/2012   HGBA1C 5.8 09/11/2022   POCT - today  - COVID neg, Flu A/B - neg, RSV - neg  Assessment/Plan:  Robin Ayers is a 46 y.o. White or Caucasian [1] female with  has a past medical history of ALLERGIC RHINITIS (10/26/2007), ASTHMA (04/10/2010), Blood dyscrasia, Clotting disorder ([place]), DYSLIPIDEMIA (06/24/2009), Factor V Leiden mutation ([place]), Family history of anesthesia complication, Ovarian cyst, and PONV (postoperative nausea and vomiting).  Sinusitis Mild to mod, for antibx course levaquin 500 qd x 10,   to f/u any worsening symptoms or concerns  HTN (hypertension) BP Readings from Last 3 Encounters:  11/26/22 120/80  09/11/22 124/70  04/13/22 128/72   Stable, pt to continue medical treatment losartan 50 mg qd   Wheezing Mild to mod, for prednisone taper, cough med prn, change symbicort to generic advair,  to f/u any worsening symptoms or concerns  Followup: Return if symptoms worsen or fail to improve.  Cathlean Cower, MD 11/26/2022 9:06 PM [facility]

## 2022-11-26 NOTE — Assessment & Plan Note (Signed)
Mild to mod, for antibx course levaquin 500 qd x 10,   to f/u any worsening symptoms or concerns

## 2022-11-26 NOTE — Patient Instructions (Signed)
Your COVID, Flu and RSV testing was negative today  Please take all new medication as prescribed - the antibiotic, cough medicine, prednisone, and the new Advair inhaler  Please continue all other medications as before, including your albuterol as needed  Please have the pharmacy call with any other refills you may need.  Please continue your efforts at being more active, low cholesterol diet, and weight control.  Please keep your appointments with your specialists as you may have planned

## 2022-11-26 NOTE — Assessment & Plan Note (Signed)
Mild to mod, for prednisone taper, cough med prn, change symbicort to generic advair,  to f/u any worsening symptoms or concerns

## 2022-11-26 NOTE — Assessment & Plan Note (Signed)
BP Readings from Last 3 Encounters:  11/26/22 120/80  09/11/22 124/70  04/13/22 128/72   Stable, pt to continue medical treatment losartan 50 mg qd

## 2022-12-03 ENCOUNTER — Telehealth (INDEPENDENT_AMBULATORY_CARE_PROVIDER_SITE_OTHER): Payer: BC Managed Care – PPO | Admitting: Nurse Practitioner

## 2022-12-03 VITALS — Ht 68.0 in

## 2022-12-03 DIAGNOSIS — J0101 Acute recurrent maxillary sinusitis: Secondary | ICD-10-CM

## 2022-12-03 MED ORDER — DOXYCYCLINE HYCLATE 100 MG PO TABS: 100.0000 mg | ORAL_TABLET | Freq: Two times a day (BID) | ORAL | 0 refills | Status: DC

## 2022-12-03 MED ORDER — PROMETHAZINE-DM 6.25-15 MG/5ML PO SYRP: 5.0000 mL | ORAL_SOLUTION | Freq: Two times a day (BID) | ORAL | 0 refills | Status: DC | PRN

## 2022-12-03 NOTE — Assessment & Plan Note (Signed)
Acute, not responding to levofloxacin.  Will switch to doxycycline 100 mg twice daily x 10 days, patient will complete prednisone taper as previously instructed.  Will prescribe promethazine dextromethorphan cough syrup to see if this helps suppress cough without causing migraine, patient told not to drive or operate heavy machinery will taking the medication.  She reports understanding.  Patient told to call office if symptoms still do not respond by early to mid next week, also told to call 911 if symptoms worsen.

## 2022-12-03 NOTE — Progress Notes (Signed)
    Established Patient Office Visit  An audio/visual tele-health visit was completed today for this patient. I connected with  Robin Ayers on 12/03/22 utilizing audio/visual technology and verified that I am speaking with the correct person using two identifiers. The patient was located at their home, and I was located at the office of [place] [facility] during the encounter. I discussed the limitations of evaluation and management by telemedicine. The patient expressed understanding and agreed to proceed.     Subjective   Patient ID: Robin Ayers, female    DOB: [birth year]  Age: 46 y.o. MRN: [id number]  Chief Complaint  Patient presents with   Follow-up    Getting short on medication, symptoms is not getting better, need more dose of medication to get better, cough, congestion , rattle in the chest ( chest hurt)     Symptoms started initially 3 weeks ago.  She was seen approxi-1 week ago by PCP and was treated for acute sinusitis with levofloxacin, prednisone, Hycodan cough syrup.  Tested for COVID, flu and RSV last office visit all of which were negative.  She reports that her symptoms have persisted and have not improved at all with current treatment regimen.  She continues to have fatigue, productive cough, congestion, headache, and some shortness of breath.  Reports that the Hycodan cough syrup triggered migraines so she is not taking that.  Having a hard time sleeping due to this.    Review of Systems  Constitutional:  Positive for malaise/fatigue. Negative for fever.  HENT:  Positive for congestion.   Respiratory:  Positive for cough, sputum production (sometimes; clear) and shortness of breath.   Cardiovascular:  Positive for chest pain (with deep breathing).  Gastrointestinal:  Negative for diarrhea, nausea and vomiting.  Neurological:  Positive for headaches.      Objective:     Ht '5\' 8"'$  (1.727 m)   LMP 04/16/2012   BMI 38.01  kg/m    Physical Exam Comprehensive physical exam not completed today as office visit was conducted remotely.  Patient coughs frequently during video visit, she also sounds congested, no signs of acute respiratory distress noted.  Patient was alert and oriented, and appeared to have appropriate judgment.   No results found for any visits on 12/03/22.    The 10-year ASCVD risk score (Arnett DK, et al., 2019) is: 0.8%    Assessment & Plan:   Problem List Items Addressed This Visit       Respiratory   Sinusitis - Primary    Acute, not responding to levofloxacin.  Will switch to doxycycline 100 mg twice daily x 10 days, patient will complete prednisone taper as previously instructed.  Will prescribe promethazine dextromethorphan cough syrup to see if this helps suppress cough without causing migraine, patient told not to drive or operate heavy machinery will taking the medication.  She reports understanding.  Patient told to call office if symptoms still do not respond by early to mid next week, also told to call 911 if symptoms worsen.      Relevant Medications   doxycycline (VIBRA-TABS) 100 MG tablet   promethazine-dextromethorphan (PROMETHAZINE-DM) 6.25-15 MG/5ML syrup    Return if symptoms worsen or fail to improve.    Ailene Ards, NP

## 2022-12-08 ENCOUNTER — Ambulatory Visit
Admission: RE | Admit: 2022-12-08 | Discharge: 2022-12-08 | Disposition: A | Discharge status: Home / Self Care | Payer: BC Managed Care – PPO | Source: Ambulatory Visit | Attending: Internal Medicine | Admitting: Internal Medicine

## 2022-12-08 DIAGNOSIS — Z1231 Encounter for screening mammogram for malignant neoplasm of breast: Secondary | ICD-10-CM

## 2022-12-10 ENCOUNTER — Other Ambulatory Visit: Payer: Self-pay | Admitting: Internal Medicine

## 2022-12-10 DIAGNOSIS — R928 Other abnormal and inconclusive findings on diagnostic imaging of breast: Secondary | ICD-10-CM

## 2022-12-24 ENCOUNTER — Ambulatory Visit (INDEPENDENT_AMBULATORY_CARE_PROVIDER_SITE_OTHER): Payer: BC Managed Care – PPO

## 2022-12-24 ENCOUNTER — Encounter: Payer: Self-pay | Admitting: Family Medicine

## 2022-12-24 ENCOUNTER — Ambulatory Visit (INDEPENDENT_AMBULATORY_CARE_PROVIDER_SITE_OTHER): Payer: BC Managed Care – PPO | Admitting: Family Medicine

## 2022-12-24 VITALS — BP 132/84 | HR 94 | Temp 97.8°F | Ht 68.0 in | Wt 252.0 lb

## 2022-12-24 DIAGNOSIS — J069 Acute upper respiratory infection, unspecified: Secondary | ICD-10-CM

## 2022-12-24 DIAGNOSIS — J3489 Other specified disorders of nose and nasal sinuses: Secondary | ICD-10-CM

## 2022-12-24 DIAGNOSIS — J4520 Mild intermittent asthma, uncomplicated: Secondary | ICD-10-CM

## 2022-12-24 DIAGNOSIS — R058 Other specified cough: Secondary | ICD-10-CM

## 2022-12-24 LAB — CBC WITH DIFFERENTIAL/PLATELET
Basophils Absolute: 0.1 10*3/uL (ref 0.0–0.1)
Basophils Relative: 0.9 % (ref 0.0–3.0)
Eosinophils Absolute: 0.1 10*3/uL (ref 0.0–0.7)
Eosinophils Relative: 2.2 % (ref 0.0–5.0)
HCT: 41.9 % (ref 36.0–46.0)
Hemoglobin: 14.2 g/dL (ref 12.0–15.0)
Lymphocytes Relative: 12.9 % (ref 12.0–46.0)
Lymphs Abs: 0.8 10*3/uL (ref 0.7–4.0)
MCHC: 33.8 g/dL (ref 30.0–36.0)
MCV: 87.8 fl (ref 78.0–100.0)
Monocytes Absolute: 0.5 10*3/uL (ref 0.1–1.0)
Monocytes Relative: 7.4 % (ref 3.0–12.0)
Neutro Abs: 4.7 10*3/uL (ref 1.4–7.7)
Neutrophils Relative %: 76.6 % (ref 43.0–77.0)
Platelets: 240 10*3/uL (ref 150.0–400.0)
RBC: 4.78 Mil/uL (ref 3.87–5.11)
RDW: 13.7 % (ref 11.5–15.5)
WBC: 6.2 10*3/uL (ref 4.0–10.5)

## 2022-12-24 NOTE — Patient Instructions (Signed)
Please go downstairs for labs and a chest X ray before you leave.   Continue treating your symptoms. May take Mucinex.   We will be in touch with your results.

## 2022-12-24 NOTE — Progress Notes (Signed)
Subjective:  Robin Ayers is a 47 y.o. female who presents for headache, sinus pain, congestion, dizziness, earaches, cough that is productive of yellow sputum, mild shortness of breath with her asthma. Using Symbicort inhaler daily as prescribed and lately needing albuterol inhaler.  Taking Zyrtec and Singulair for allergies.   No fever, chills, body aches, rhinorrhea, chest pain, palpitations, abdominal pain, N/V/D.   She took a course of Levaquin which did not help and then was switched to Doxycycline in late December. She also took a course of oral steroids in early January.  Reports feeling 65% improved.    ROS as in subjective.   Objective: Vitals:   12/24/22 1303  BP: 132/84  Pulse: 94  Temp: 97.8 F (36.6 C)  SpO2: 98%    General appearance: Alert, WD/WN, no distress, mildly ill appearing                             Skin: warm, no rash                                                 Eyes: conjunctiva normal, corneas clear, PERRLA                            Ears: pearly TMs, external ear canals normal                          Nose: septum midline, turbinates swollen, with erythema             Mouth/throat: MMM, tongue normal, mild pharyngeal erythema                           Neck: supple, no adenopathy, no thyromegaly, nontender                          Heart: RRR                         Lungs: CTA bilaterally, no wheezes, rales, or rhonchi      Assessment: Cough present for greater than 3 weeks - Plan: DG Chest 2 View, CBC with Differential/Platelet, CBC with Differential/Platelet  Upper respiratory tract infection, unspecified type  Mild intermittent asthma, unspecified whether complicated  Sinus pain   Plan: Reviewed notes from 2 previous PCP visits.  No acute distress.  CXR and CBC ordered.  Discussed diagnosis and treatment of URI.  Suggested symptomatic OTC remedies including Flonase and Mucinex.  Continue with allergy and asthma  medications.   Call/return if worsening or if symptoms aren't resolving.

## 2022-12-28 ENCOUNTER — Ambulatory Visit
Admission: RE | Admit: 2022-12-28 | Discharge: 2022-12-28 | Disposition: A | Discharge status: Home / Self Care | Payer: BC Managed Care – PPO | Source: Ambulatory Visit | Attending: Internal Medicine | Admitting: Internal Medicine

## 2022-12-28 DIAGNOSIS — R928 Other abnormal and inconclusive findings on diagnostic imaging of breast: Secondary | ICD-10-CM

## 2022-12-29 ENCOUNTER — Other Ambulatory Visit: Payer: Self-pay | Admitting: Internal Medicine

## 2022-12-29 DIAGNOSIS — N6489 Other specified disorders of breast: Secondary | ICD-10-CM

## 2023-01-08 ENCOUNTER — Ambulatory Visit
Admission: RE | Admit: 2023-01-08 | Discharge: 2023-01-08 | Disposition: A | Discharge status: Home / Self Care | Payer: BC Managed Care – PPO | Source: Ambulatory Visit | Attending: Internal Medicine | Admitting: Internal Medicine

## 2023-01-08 DIAGNOSIS — N6489 Other specified disorders of breast: Secondary | ICD-10-CM

## 2023-01-08 HISTORY — PX: BREAST BIOPSY: SHX20

## 2023-01-20 ENCOUNTER — Encounter: Payer: Self-pay | Admitting: Internal Medicine

## 2023-01-27 ENCOUNTER — Encounter: Payer: Self-pay | Admitting: Internal Medicine

## 2023-03-10 ENCOUNTER — Ambulatory Visit (INDEPENDENT_AMBULATORY_CARE_PROVIDER_SITE_OTHER): Payer: BC Managed Care – PPO | Admitting: Internal Medicine

## 2023-03-10 ENCOUNTER — Encounter: Payer: Self-pay | Admitting: Internal Medicine

## 2023-03-10 VITALS — BP 116/80 | HR 90 | Ht 68.0 in | Wt 257.0 lb

## 2023-03-10 DIAGNOSIS — R1310 Dysphagia, unspecified: Secondary | ICD-10-CM

## 2023-03-10 DIAGNOSIS — Z1211 Encounter for screening for malignant neoplasm of colon: Secondary | ICD-10-CM

## 2023-03-10 DIAGNOSIS — R935 Abnormal findings on diagnostic imaging of other abdominal regions, including retroperitoneum: Secondary | ICD-10-CM

## 2023-03-10 DIAGNOSIS — K219 Gastro-esophageal reflux disease without esophagitis: Secondary | ICD-10-CM

## 2023-03-10 MED ORDER — PANTOPRAZOLE SODIUM 40 MG PO TBEC: 40.0000 mg | DELAYED_RELEASE_TABLET | Freq: Every day | ORAL | 11 refills | Status: DC

## 2023-03-10 NOTE — Patient Instructions (Signed)
You have been scheduled for an endoscopy. Please follow written instructions given to you at your visit today. If you use inhalers (even only as needed), please bring them with you on the day of your procedure.  You have been scheduled for a CT scan of the abdomen and pelvis at [facility], 1st floor Radiology. You are scheduled on 03/31/2023 at 10:30. You should arrive 2 hours prior to your appointment time for registration.   1) Do not eat anything 4 hours prior to your test)   You may take any medications as prescribed with a small amount of water, if necessary. If you take any of the following medications: METFORMIN, GLUCOPHAGE, [place], AVANDAMET, RIOMET, FORTAMET, [place] MET, JANUMET, GLUMETZA or METAGLIP, you MAY be asked to HOLD this medication 48 hours AFTER the exam.   If you have any questions regarding your exam or if you need to reschedule, you may call [facility] at [phone] between the hours of 8:00 am and 5:00 pm, Monday-Friday.    We have sent the following medications to your pharmacy for you to pick up at your convenience: Pantoprazole 40 mg  _______________________________________________________  If your blood pressure at your visit was 140/90 or greater, please contact your primary care physician to follow up on this.  _______________________________________________________  If you are age 64 or older, your body mass index should be between 23-30. Your Body mass index is 39.08 kg/m. If this is out of the aforementioned range listed, please consider follow up with your Primary Care Provider.  If you are age 80 or younger, your body mass index should be between 19-25. Your Body mass index is 39.08 kg/m. If this is out of the aformentioned range listed, please consider follow up with your Primary Care Provider.   ________________________________________________________  The [facility] providers would like to encourage you to use [facility] to  communicate with providers for non-urgent requests or questions.  Due to long hold times on the telephone, sending your provider a message by [facility] may be a faster and more efficient way to get a response.  Please allow 48 business hours for a response.  Please remember that this is for non-urgent requests.  _______________________________________________________  It was a pleasure to see you today!  Thank you for trusting me with your gastrointestinal care!

## 2023-03-10 NOTE — Progress Notes (Signed)
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 Gloriann Loan regarding abnormal CT scan.  The patient underwent a noncontrast CT scan of the abdomen January 04, 2023.  This to follow-up on left renal lesion.  She was noted to have distal esophageal wall thickening and a small hiatal hernia as well as small paraesophageal lymph nodes.  She was then referred.  Patient tells me that she has been having significant reflux disease over the past several months for which she has been taking Pepcid regularly.  Symptoms are worse at night.  She is also been experiencing some intermittent solid food dysphagia.  Other complaints include bloating and gas.  Her weight fluctuates.  No GI bleeding.  No prior GI evaluations.  No family history of GI cancers.  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  Review of blood work from October 2023 shows normal comprehensive metabolic panel, normal lipid profile, normal CBC with hemoglobin 14.0, normal hemoglobin A1c of 5.8.  REVIEW OF SYSTEMS:  All non-GI ROS negative unless otherwise stated in the HPI except for sinus and allergies, lower extremity edema, excessive thirst  Past Medical History:  Diagnosis Date   ALLERGIC RHINITIS 10/26/2007   ASTHMA 04/10/2010   Blood dyscrasia    factor V Leiden    Clotting disorder    DYSLIPIDEMIA 06/24/2009   Factor V Leiden mutation    hx of   Family history of anesthesia complication    PONV   Ovarian cyst    PONV (postoperative nausea and vomiting)     Past Surgical History:  Procedure Laterality Date   ABDOMINAL HYSTERECTOMY     BLADDER SUSPENSION  05/26/2012   Procedure: TRANSVAGINAL TAPE (TVT) PROCEDURE;  Surgeon: Daria Pastures, MD;  Location: [facility];  Service: Gynecology;  Laterality: N/A;   BREAST BIOPSY Left 01/08/2023   MM LT BREAST BX W LOC DEV 1ST LESION IMAGE BX SPEC STEREO GUIDE 01/08/2023 [facility]  MAMMOGRAPHY   CYSTOCELE REPAIR  05/26/2012   Procedure: ANTERIOR REPAIR (CYSTOCELE);  Surgeon: Daria Pastures, MD;  Location: [facility];  Service: Gynecology;  Laterality: N/A;   CYSTOSCOPY  05/26/2012   Procedure: CYSTOSCOPY;  Surgeon: Daria Pastures, MD;  Location: [facility];  Service: Gynecology;  Laterality: N/A;   LAPAROSCOPIC ASSISTED VAGINAL HYSTERECTOMY  05/26/2012   Procedure: LAPAROSCOPIC ASSISTED VAGINAL HYSTERECTOMY;  Surgeon: Gus Height, MD;  Location: [facility];  Service: Gynecology;  Laterality: N/A;   SALPINGOOPHORECTOMY  05/26/2012   Procedure: SALPINGO OOPHERECTOMY;  Surgeon: Gus Height, MD;  Location: [facility];  Service: Gynecology;  Laterality: Left;   TONSILLECTOMY AND ADENOIDECTOMY     WISDOM TOOTH EXTRACTION      Social History Robin Ayers  reports that she has quit smoking. She has never used smokeless tobacco. She reports that she does not drink alcohol and does not use drugs.  family history includes Diabetes in her mother and another family member; Heart attack (age of onset: 70) in her mother; Heart disease in her maternal grandfather and mother; Hypertension in her maternal grandfather, mother, and another family member.  Allergies  Allergen Reactions   Clarithromycin     REACTION: GI upset   Hycodan [Hydrocodone Bit-Homatrop Mbr]     migraine   Penicillins     REACTION: rash       PHYSICAL EXAMINATION: Vital signs: BP 116/80 (BP Location: Left Arm, Patient  Position: Sitting, Cuff Size: Large)   Pulse 90   Ht 5\' 8"  (1.727 m)   Wt 257 lb (116.6 kg)   LMP 04/16/2012   BMI 39.08 kg/m   Constitutional: generally well-appearing, no acute distress Psychiatric: alert and oriented x3, cooperative Eyes: extraocular movements intact, anicteric, conjunctiva pink Mouth: oral pharynx moist, no lesions Neck: supple no lymphadenopathy Cardiovascular: heart regular rate and rhythm, no murmur Lungs: clear to auscultation bilaterally Abdomen: soft,  nontender, nondistended, no obvious ascites, no peritoneal signs, normal bowel sounds, no organomegaly Rectal: Omitted Extremities: no clubbing, cyanosis, or lower extremity edema bilaterally Skin: no lesions on visible extremities Neuro: No focal deficits.  Cranial nerves intact  ASSESSMENT:  1.  Chronic GERD.  Significant symptoms despite Pepcid 2.  Intermittent solid food dysphagia.  Rule out peptic stricture.  Rule out neoplasm. 3.  Abnormal CT scan with esophageal thickening.  Suspect reflux esophagitis.  Needs evaluated. 4.  Obesity 5.  Colon cancer screening.  Average risk 6.  General medical problems.  Stable  PLAN:  1.  Reflux precautions with attention to weight loss 2.  Prescribe pantoprazole 40 mg daily.  Medication effects and risks reviewed. 3.  Schedule upper endoscopy with probable esophageal dilation.The nature of the procedure, as well as the risks, benefits, and alternatives were carefully and thoroughly reviewed with the patient. Ample time for discussion and questions allowed. The patient understood, was satisfied, and agreed to proceed. 4.  Office follow-up after the above 5.  Will discuss colon cancer screening strategies after esophageal issues have been adequately addressed

## 2023-03-17 NOTE — Progress Notes (Signed)
Office Visit Note  Patient: Robin Ayers             Date of Birth: Aug 22, 1976           MRN: [id number]             PCP: Corwin Levins, MD Referring: Corwin Levins, MD Visit Date: 03/31/2023 Occupation: @GUAROCC @  Subjective:  Fatigue, joint pain, positive ANA  History of Present Illness: Robin Ayers is a 47 y.o. female seen in consultation per request of her PCP.  According to started while she was in her 30s with fatigue and arthralgias.  She states the symptoms have been gradually getting worse.  She also injured her right knee joint as a child which causes pain and intermittent swelling.  She complains of discomfort in her lower back especially on the left side, her bilateral wrist, bilateral hands more so on the right hand where she uses a brace occasional discomfort in her hip joints ongoing discomfort in her right knee joint bilateral ankles and her feet.  She notices swelling in her hands and her ankles.  There is no history of oral ulcers, nasal ulcers, malar rash, Raynaud's phenomenon.  She gives history of dry eyes, hair loss, photosensitivity.  There is no history of lymphadenopathy.  There is family history of lupus in her mother and 2 maternal aunts.  She is gravida 4, para 2, miscarriages 2.  There is no history of preeclampsia or eclampsia.  She had a DVT while she was 16 and was on oral contraceptive pills.  She was diagnosed with factor V Leiden mutation.  She has been on aspirin 81 mg p.o. daily since then.    Activities of Daily Living:  Patient reports morning stiffness for 30-60 minutes.   Patient Reports nocturnal pain.  Difficulty dressing/grooming: Denies Difficulty climbing stairs: Reports Difficulty getting out of chair: Reports Difficulty using hands for taps, buttons, cutlery, and/or writing: Reports  Review of Systems  Constitutional:  Positive for fatigue.  HENT:  Negative for mouth sores and mouth dryness.   Eyes:  Positive  for dryness.  Respiratory:  Positive for shortness of breath.        Asthma  Cardiovascular:  Negative for chest pain and palpitations.  Gastrointestinal:  Positive for diarrhea. Negative for blood in stool and constipation.       Related to protonix  Endocrine: Positive for increased urination.  Genitourinary:  Positive for involuntary urination.  Musculoskeletal:  Positive for joint pain, gait problem, joint pain, joint swelling, myalgias, muscle weakness, morning stiffness, muscle tenderness and myalgias.  Skin:  Positive for hair loss and sensitivity to sunlight. Negative for color change and rash.  Allergic/Immunologic: Positive for susceptible to infections.  Neurological:  Negative for dizziness and headaches.  Hematological:  Negative for swollen glands.  Psychiatric/Behavioral:  Positive for sleep disturbance. Negative for depressed mood. The patient is not nervous/anxious.     PMFS History:  Patient Active Problem List   Diagnosis Date Noted   Polyarthralgia 09/11/2022   Obesity 04/16/2022   HTN (hypertension) 03/13/2022   Migraine 03/15/2021   Vitamin D deficiency 03/08/2020   Upper respiratory infection 01/06/2018   Cough 12/30/2017   Wheezing 12/30/2017   Rash and nonspecific skin eruption 05/27/2016   Sinusitis 10/11/2013   Factor V Leiden mutation [facility])    Encounter for well adult exam with abnormal findings 07/06/2011   Asthma 04/10/2010   Tenosynovitis of foot and ankle 10/11/2009   Hyperlipidemia  06/24/2009   HAND PAIN, BILATERAL 06/24/2009   FATIGUE 06/24/2009   URINARY INCONTINENCE 06/24/2009   Allergic rhinitis 10/26/2007    Past Medical History:  Diagnosis Date   ALLERGIC RHINITIS 10/26/2007   ASTHMA 04/10/2010   Blood dyscrasia    factor V Leiden    Clotting disorder    DYSLIPIDEMIA 06/24/2009   Factor V Leiden mutation    hx of   Family history of anesthesia complication    PONV   GERD (gastroesophageal reflux disease)    Ovarian cyst     PONV (postoperative nausea and vomiting)     Family History  Problem Relation Age of Onset   Heart disease Mother    Heart attack Mother 26   Diabetes Mother    Hypertension Mother    Lupus Mother    Lupus Maternal Aunt    Lupus Maternal Aunt    Hypertension Maternal Grandfather    Heart disease Maternal Grandfather    Diabetes Other    Hypertension Other    Healthy Daughter    Polycystic ovary syndrome Daughter    Healthy Daughter    Breast cancer Neg Hx    Past Surgical History:  Procedure Laterality Date   BLADDER SUSPENSION  05/26/2012   Procedure: TRANSVAGINAL TAPE (TVT) PROCEDURE;  Surgeon: Loney Laurence, MD;  Location: [facility];  Service: Gynecology;  Laterality: N/A;   BREAST BIOPSY Left 01/08/2023   MM LT BREAST BX W LOC DEV 1ST LESION IMAGE BX SPEC STEREO GUIDE 01/08/2023 [facility] MAMMOGRAPHY   CYSTOCELE REPAIR  05/26/2012   Procedure: ANTERIOR REPAIR (CYSTOCELE);  Surgeon: Loney Laurence, MD;  Location: [facility];  Service: Gynecology;  Laterality: N/A;   CYSTOSCOPY  05/26/2012   Procedure: CYSTOSCOPY;  Surgeon: Loney Laurence, MD;  Location: [facility];  Service: Gynecology;  Laterality: N/A;   LAPAROSCOPIC ASSISTED VAGINAL HYSTERECTOMY  05/26/2012   Procedure: LAPAROSCOPIC ASSISTED VAGINAL HYSTERECTOMY;  Surgeon: Miguel Aschoff, MD;  Location: [facility];  Service: Gynecology;  Laterality: N/A;   SALPINGOOPHORECTOMY  05/26/2012   Procedure: SALPINGO OOPHERECTOMY;  Surgeon: Miguel Aschoff, MD;  Location: [facility];  Service: Gynecology;  Laterality: Left;   TONSILLECTOMY AND ADENOIDECTOMY     WISDOM TOOTH EXTRACTION     Social History   Social History Narrative   Regular exercise-yes   Husband has had a vasectomy   Immunization History  Administered Date(s) Administered   Influenza Split 09/09/2012   Influenza Whole 08/21/2010, 09/07/2019   Influenza,inj,Quad PF,6+ Mos 09/02/2016, 10/12/2018   Influenza-Unspecified 09/07/2015   PFIZER(Purple Top)SARS-COV-2 Vaccination  05/24/2020, 06/20/2020   Tdap 10/04/2017     Objective: Vital Signs: BP 128/89 (BP Location: Right Arm, Patient Position: Sitting, Cuff Size: Large)   Pulse 74   Resp 15   Ht 5' 8.5" (1.74 m)   Wt 256 lb (116.1 kg)   LMP 04/16/2012   BMI 38.36 kg/m    Physical Exam Vitals and nursing note reviewed.  Constitutional:      Appearance: She is well-developed.  HENT:     Head: Normocephalic and atraumatic.  Eyes:     Conjunctiva/sclera: Conjunctivae normal.  Cardiovascular:     Rate and Rhythm: Normal rate and regular rhythm.     Heart sounds: Normal heart sounds.  Pulmonary:     Effort: Pulmonary effort is normal.     Breath sounds: Normal breath sounds.  Abdominal:     General: Bowel sounds are normal.     Palpations: Abdomen is soft.  Musculoskeletal:  Cervical back: Normal range of motion.  Lymphadenopathy:     Cervical: No cervical adenopathy.  Skin:    General: Skin is warm and dry.     Capillary Refill: Capillary refill takes less than 2 seconds.     Comments: Generalized facial erythema was noted.  No nailbed capillary changes or sclerodactyly was noted.  Neurological:     Mental Status: She is alert and oriented to person, place, and time.  Psychiatric:        Behavior: Behavior normal.      Musculoskeletal Exam: Cervical, thoracic and lumbar spine were in good range of motion.  Shoulder joints, elbow joints, wrist joints, MCPs PIPs and DIPs been good range of motion.  No synovitis was noted.  Hip joints and knee joints were in good range of motion.  Hip joints were in good range of motion with no discomfort.  She had discomfort with flexion of her bilateral knee joints more prominent on the right side.  She had tenderness over ankles but no synovitis was noted.  She had tenderness over MTPs but no synovitis was noted.  CDAI Exam: CDAI Score: -- Patient Global: --; Provider Global: -- Swollen: --; Tender: -- Joint Exam 03/31/2023   No joint exam has been  documented for this visit   There is currently no information documented on the homunculus. Go to the Rheumatology activity and complete the homunculus joint exam.  Investigation: No additional findings.  Imaging: CT Abdomen Pelvis W Contrast  Result Date: 03/20/2023 CLINICAL DATA:  Gastroesophageal reflux disease. EXAM: CT ABDOMEN AND PELVIS WITH CONTRAST TECHNIQUE: Multidetector CT imaging of the abdomen and pelvis was performed using the standard protocol following bolus administration of intravenous contrast. RADIATION DOSE REDUCTION: This exam was performed according to the departmental dose-optimization program which includes automated exposure control, adjustment of the mA and/or kV according to patient size and/or use of iterative reconstruction technique. CONTRAST:  OMNIPAQUE IOHEXOL 300 MG/ML  SOLN COMPARISON:  None Available. FINDINGS: Lower Chest: No acute findings. Hepatobiliary: No hepatic masses identified. Gallbladder is unremarkable. No evidence of biliary ductal dilatation. Pancreas:  No mass or inflammatory changes. Spleen: Within normal limits in size and appearance. Adrenals/Urinary Tract: Adrenal glands are unremarkable. A homogeneous fat attenuation left renal mass is seen which measures 1.7 cm, consistent with benign angiomyolipoma. No evidence of associated hemorrhage. A 1.0 cm low-attenuation lesion is seen in the medial midpole the left kidney which appears to represent complex cystic lesion with suspected internal septations. No evidence of ureteral calculi or hydronephrosis. Unremarkable unopacified urinary bladder. Stomach/Bowel: No evidence of obstruction, inflammatory process or abnormal fluid collections. Vascular/Lymphatic: No pathologically enlarged lymph nodes. No acute vascular findings. Reproductive: Prior hysterectomy noted. Adnexal regions are unremarkable in appearance. Other:  None. Musculoskeletal:  No suspicious bone lesions identified. IMPRESSION: No acute  findings. 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 1 cm probable complex cystic lesion in medial midpole of left kidney. Abdomen MRI without and with contrast is recommended for further characterization. Electronically Signed   By: Danae Orleans M.D.   On: 03/20/2023 21:45    Recent Labs: Lab Results  Component Value Date   WBC 6.2 12/24/2022   HGB 14.2 12/24/2022   PLT 240.0 12/24/2022   NA 138 09/11/2022   K 4.0 09/11/2022   CL 103 09/11/2022   CO2 27 09/11/2022   GLUCOSE 95 09/11/2022   BUN 23 09/11/2022   CREATININE 0.86 09/11/2022   BILITOT 0.5 09/11/2022   ALKPHOS 96 09/11/2022  AST 15 09/11/2022   ALT 14 09/11/2022   PROT 7.1 09/11/2022   ALBUMIN 4.5 09/11/2022   CALCIUM 9.6 09/11/2022   GFRAA >90 05/12/2012   September 11, 2022 ANA 1: 80NS, dsDNA negative, RF negative, ESR 19, C-reactive protein<1.0, LDL 79, hemoglobin A1c 5.8  Speciality Comments: No specialty comments available.  Procedures:  No procedures performed Allergies: Clarithromycin, Hycodan [hydrocodone bit-homatrop mbr], and Penicillins   Assessment / Plan:     Visit Diagnoses: Positive ANA (antinuclear antibody) -patient was referred to me for positive ANA of 1: 80 nuclear speckled.  She gives history of fatigue, arthralgias, hair loss, dry eyes and photosensitivity.  There is no history of oral ulcers, nasal ulcers, malar rash, lymphadenopathy, Raynaud's phenomenon or inflammatory arthritis.  There is a strong family history of lupus in her mother and 2 maternal aunts.  I will obtain following labs today.  Plan: Protein / creatinine ratio, urine, ANA, Anti-scleroderma antibody, RNP Antibody, Anti-Smith antibody, Sjogrens syndrome-A extractable nuclear antibody, Sjogrens syndrome-B extractable nuclear antibody, Anti-DNA antibody, double-stranded, C3 and C4, Beta-2 glycoprotein antibodies, Cardiolipin antibodies, IgG, IgM, IgA, Lupus Anticoagulant Eval w/Reflex  Polyarthralgia-she complains of pain and discomfort  in multiple joints over the years.  Pain in both hands -she complains of pain in her bilateral hands with intermittent swelling.  No synovitis was noted.  I will obtain labs and x-rays today.  Plan: XR Hand 2 View Right, XR Hand 2 View Left, x-rays of bilateral hands were consistent with osteoarthritis.  Cyclic citrul peptide antibody, IgG  Chronic pain of right knee -patient states she injured her right knee joint as a child.  She has chronic discomfort in her right knee joint.  No warmth swelling or effusion was noted.  Plan: XR KNEE 3 VIEW RIGHT.  X-rays of the right knee joint were unremarkable.  Pain in both feet -she complains of discomfort in her bilateral feet and ankles.  She notices intermittent swelling over ankles.  No synovitis was noted.  Plan: XR Foot 2 Views Right, XR Foot 2 Views Left.  X-rays showed osteoarthritic changes in bilateral feet.  Other fatigue -she complains of fatigue for many years which has been progressively getting worse.  I will obtain following labs today.  Plan: CBC with Differential/Platelet, COMPLETE METABOLIC PANEL WITH GFR, CK, TSH, Glucose 6 phosphate dehydrogenase  Family history of systemic lupus erythematosus-mother, maternal aunts  Primary hypertension-blood pressure was 128/89 today.  She is on losartan and Aldactone.  Mixed hyperlipidemia-she takes Lipitor 40 mg p.o. daily.  History of DVT (deep vein thrombosis) -patient reports that she developed DVT when she was a teenager and was on oral contraceptive pills.  At the time she was diagnosed with factor V Leiden mutation.  She has been on aspirin since then.  Plan: Lupus Anticoagulant Eval w/Reflex  Factor V Leiden mutation (HCC)  History of gastroesophageal reflux (GERD)-her symptoms are manageable on Protonix.  Mild intermittent asthma, unspecified whether complicated  Seasonal allergic rhinitis due to other allergic trigger  Orders: Orders Placed This Encounter  Procedures   XR KNEE 3  VIEW RIGHT   XR Hand 2 View Right   XR Hand 2 View Left   XR Foot 2 Views Right   XR Foot 2 Views Left   CBC with Differential/Platelet   COMPLETE METABOLIC PANEL WITH GFR   CK   TSH   Protein / creatinine ratio, urine   Cyclic citrul peptide antibody, IgG   ANA   Anti-scleroderma antibody  RNP Antibody   Anti-Smith antibody   Sjogrens syndrome-A extractable nuclear antibody   Sjogrens syndrome-B extractable nuclear antibody   Anti-DNA antibody, double-stranded   C3 and C4   Beta-2 glycoprotein antibodies   Cardiolipin antibodies, IgG, IgM, IgA   Glucose 6 phosphate dehydrogenase   Lupus Anticoagulant Eval w/Reflex   No orders of the defined types were placed in this encounter.    Follow-Up Instructions: Return for Positive ANA, fatigue, joint pain.   Pollyann Savoy, MD  Note - This record has been created using Animal nutritionist.  Chart creation errors have been sought, but may not always  have been located. Such creation errors do not reflect on  the standard of medical care.

## 2023-03-19 ENCOUNTER — Ambulatory Visit (HOSPITAL_BASED_OUTPATIENT_CLINIC_OR_DEPARTMENT_OTHER)
Admission: RE | Admit: 2023-03-19 | Discharge: 2023-03-19 | Disposition: A | Discharge status: Home / Self Care | Payer: BC Managed Care – PPO | Source: Ambulatory Visit | Attending: Internal Medicine | Admitting: Internal Medicine

## 2023-03-19 DIAGNOSIS — K219 Gastro-esophageal reflux disease without esophagitis: Secondary | ICD-10-CM

## 2023-03-19 DIAGNOSIS — R1310 Dysphagia, unspecified: Secondary | ICD-10-CM | POA: Insufficient documentation

## 2023-03-19 MED ORDER — IOHEXOL 300 MG/ML  SOLN
100.0000 mL | Freq: Once | INTRAMUSCULAR | Status: AC | PRN
  Administered 2023-03-19: 100 mL via INTRAVENOUS

## 2023-03-21 ENCOUNTER — Encounter: Payer: Self-pay | Admitting: Internal Medicine

## 2023-03-31 ENCOUNTER — Ambulatory Visit: Payer: BC Managed Care – PPO | Attending: Rheumatology | Admitting: Rheumatology

## 2023-03-31 ENCOUNTER — Ambulatory Visit (INDEPENDENT_AMBULATORY_CARE_PROVIDER_SITE_OTHER): Payer: BC Managed Care – PPO

## 2023-03-31 ENCOUNTER — Ambulatory Visit: Payer: BC Managed Care – PPO

## 2023-03-31 ENCOUNTER — Encounter: Payer: Self-pay | Admitting: Rheumatology

## 2023-03-31 ENCOUNTER — Ambulatory Visit (HOSPITAL_COMMUNITY): Payer: BC Managed Care – PPO

## 2023-03-31 VITALS — BP 128/89 | HR 74 | Resp 15 | Ht 68.5 in | Wt 256.0 lb

## 2023-03-31 DIAGNOSIS — J4520 Mild intermittent asthma, uncomplicated: Secondary | ICD-10-CM

## 2023-03-31 DIAGNOSIS — R768 Other specified abnormal immunological findings in serum: Secondary | ICD-10-CM | POA: Diagnosis not present

## 2023-03-31 DIAGNOSIS — Z8269 Family history of other diseases of the musculoskeletal system and connective tissue: Secondary | ICD-10-CM

## 2023-03-31 DIAGNOSIS — M2550 Pain in unspecified joint: Secondary | ICD-10-CM | POA: Diagnosis not present

## 2023-03-31 DIAGNOSIS — G8929 Other chronic pain: Secondary | ICD-10-CM | POA: Diagnosis not present

## 2023-03-31 DIAGNOSIS — M79671 Pain in right foot: Secondary | ICD-10-CM

## 2023-03-31 DIAGNOSIS — J3089 Other allergic rhinitis: Secondary | ICD-10-CM

## 2023-03-31 DIAGNOSIS — M25561 Pain in right knee: Secondary | ICD-10-CM

## 2023-03-31 DIAGNOSIS — M79642 Pain in left hand: Secondary | ICD-10-CM | POA: Diagnosis not present

## 2023-03-31 DIAGNOSIS — M79672 Pain in left foot: Secondary | ICD-10-CM

## 2023-03-31 DIAGNOSIS — I10 Essential (primary) hypertension: Secondary | ICD-10-CM

## 2023-03-31 DIAGNOSIS — M79641 Pain in right hand: Secondary | ICD-10-CM

## 2023-03-31 DIAGNOSIS — D6851 Activated protein C resistance: Secondary | ICD-10-CM

## 2023-03-31 DIAGNOSIS — R5383 Other fatigue: Secondary | ICD-10-CM

## 2023-03-31 DIAGNOSIS — Z8719 Personal history of other diseases of the digestive system: Secondary | ICD-10-CM

## 2023-03-31 DIAGNOSIS — Z86718 Personal history of other venous thrombosis and embolism: Secondary | ICD-10-CM

## 2023-03-31 DIAGNOSIS — E782 Mixed hyperlipidemia: Secondary | ICD-10-CM

## 2023-03-31 DIAGNOSIS — R7689 Other specified abnormal immunological findings in serum: Secondary | ICD-10-CM

## 2023-04-02 ENCOUNTER — Other Ambulatory Visit: Payer: Self-pay | Admitting: Internal Medicine

## 2023-04-06 LAB — CARDIOLIPIN ANTIBODIES, IGG, IGM, IGA
Anticardiolipin IgA: <2 APL-U/mL (ref <20.0)
Anticardiolipin IgG: <2 GPL-U/mL (ref <20.0)
Anticardiolipin IgM: <2 MPL-U/mL (ref <20.0)

## 2023-04-06 LAB — COMPLETE METABOLIC PANEL WITH GFR
AG Ratio: 1.8 (calc) (ref 1.0–2.5)
ALT: 19 U/L (ref 6–29)
AST: 18 U/L (ref 10–35)
Albumin: 4.3 g/dL (ref 3.6–5.1)
Alkaline phosphatase (APISO): 100 U/L (ref 31–125)
BUN: 14 mg/dL (ref 7–25)
CO2: 28 mmol/L (ref 20–32)
Calcium: 9.5 mg/dL (ref 8.6–10.2)
Chloride: 104 mmol/L (ref 98–110)
Creat: 0.81 mg/dL (ref 0.50–0.99)
Globulin: 2.4 g/dL (calc) (ref 1.9–3.7)
Glucose, Bld: 97 mg/dL (ref 65–99)
Potassium: 4.1 mmol/L (ref 3.5–5.3)
Sodium: 138 mmol/L (ref 135–146)
Total Bilirubin: 0.6 mg/dL (ref 0.2–1.2)
Total Protein: 6.7 g/dL (ref 6.1–8.1)
eGFR: 91 mL/min/{1.73_m2} (ref >=60)

## 2023-04-06 LAB — CBC WITH DIFFERENTIAL/PLATELET
Absolute Monocytes: 402 cells/uL (ref 200–950)
Basophils Absolute: 28 cells/uL (ref 0–200)
Basophils Relative: 0.5 %
Eosinophils Absolute: 110 cells/uL (ref 15–500)
Eosinophils Relative: 2 %
HCT: 42.3 % (ref 35.0–45.0)
Hemoglobin: 14.1 g/dL (ref 11.7–15.5)
Lymphs Abs: 605 cells/uL — ABNORMAL LOW (ref 850–3900)
MCH: 29.7 pg (ref 27.0–33.0)
MCHC: 33.3 g/dL (ref 32.0–36.0)
MCV: 89.2 fL (ref 80.0–100.0)
MPV: 9.4 fL (ref 7.5–12.5)
Monocytes Relative: 7.3 %
Neutro Abs: 4356 cells/uL (ref 1500–7800)
Neutrophils Relative %: 79.2 %
Platelets: 243 10*3/uL (ref 140–400)
RBC: 4.74 10*6/uL (ref 3.80–5.10)
RDW: 12.4 % (ref 11.0–15.0)
Total Lymphocyte: 11 %
WBC: 5.5 10*3/uL (ref 3.8–10.8)

## 2023-04-06 LAB — ANA: Anti Nuclear Antibody (ANA): POSITIVE — AB

## 2023-04-06 LAB — GLUCOSE 6 PHOSPHATE DEHYDROGENASE: G-6PDH: 14.3 U/g Hgb (ref 7.0–20.5)

## 2023-04-06 LAB — BETA-2 GLYCOPROTEIN ANTIBODIES
Beta-2 Glyco 1 IgA: <2 U/mL (ref <20.0)
Beta-2 Glyco 1 IgM: <2 U/mL (ref <20.0)
Beta-2 Glyco I IgG: <2 U/mL (ref <20.0)

## 2023-04-06 LAB — C3 AND C4
C3 Complement: 155 mg/dL (ref 83–193)
C4 Complement: 45 mg/dL (ref 15–57)

## 2023-04-06 LAB — ANTI-DNA ANTIBODY, DOUBLE-STRANDED: ds DNA Ab: <1 IU/mL

## 2023-04-06 LAB — ANTI-NUCLEAR AB-TITER (ANA TITER): ANA Titer 1: 1:160 {titer} — ABNORMAL HIGH

## 2023-04-06 LAB — CK: Total CK: 154 U/L — ABNORMAL HIGH (ref 29–143)

## 2023-04-06 LAB — PROTEIN / CREATININE RATIO, URINE
Creatinine, Urine: 68 mg/dL (ref 20–275)
Protein/Creat Ratio: 59 mg/g creat (ref 24–184)
Protein/Creatinine Ratio: 0.059 mg/mg creat (ref 0.024–0.184)
Total Protein, Urine: 4 mg/dL — ABNORMAL LOW (ref 5–24)

## 2023-04-06 LAB — SJOGRENS SYNDROME-A EXTRACTABLE NUCLEAR ANTIBODY: SSA (Ro) (ENA) Antibody, IgG: <1 AI

## 2023-04-06 LAB — CYCLIC CITRUL PEPTIDE ANTIBODY, IGG: Cyclic Citrullin Peptide Ab: <16 UNITS

## 2023-04-06 LAB — ANTI-SMITH ANTIBODY: ENA SM Ab Ser-aCnc: <1 AI

## 2023-04-06 LAB — LUPUS ANTICOAGULANT EVAL W/ REFLEX
PTT-LA Screen: 35 s (ref <=40)
dRVVT: 40 s (ref <=45)

## 2023-04-06 LAB — ANTI-SCLERODERMA ANTIBODY: Scleroderma (Scl-70) (ENA) Antibody, IgG: <1 AI

## 2023-04-06 LAB — RNP ANTIBODY: Ribonucleic Protein(ENA) Antibody, IgG: <1 AI

## 2023-04-06 LAB — TSH: TSH: 1.68 mIU/L

## 2023-04-06 LAB — SJOGRENS SYNDROME-B EXTRACTABLE NUCLEAR ANTIBODY: SSB (La) (ENA) Antibody, IgG: 4.1 AI — AB

## 2023-04-06 NOTE — Progress Notes (Signed)
I will discuss results at the follow-up visit.

## 2023-04-08 NOTE — Progress Notes (Signed)
Office Visit Note  Patient: Robin Ayers             Date of Birth: 04-06-1976           MRN: [id number]             PCP: Corwin Levins, MD Referring: Corwin Levins, MD Visit Date: 04/21/2023 Occupation: @GUAROCC @  Subjective:  Fatigue, joint pain and positive ANA  History of Present Illness: Robin Ayers is a 47 y.o. female returns for follow-up visit.  She states she continues to have fatigue, joint pain and dry mouth and dry eyes.  She states she has discomfort in her bilateral hands, knee joints and her feet.  She has not noticed any joint swelling.  She notices fullness in her ankles.  She gives history of dry mouth and dry eyes.  She also gets intermittent rashes when she is out in the sun.  She denies any history of oral ulcers, nasal ulcers, malar rash, Raynaud's phenomenon or lymphadenopathy.  She denies any history of shortness of breath.  There is no history of cavities.    Activities of Daily Living:  Patient reports morning stiffness for 20 minutes.   Patient Reports nocturnal pain.  Difficulty dressing/grooming: Denies Difficulty climbing stairs: Reports Difficulty getting out of chair: Reports Difficulty using hands for taps, buttons, cutlery, and/or writing: Reports  Review of Systems  Constitutional:  Positive for fatigue.  HENT:  Positive for mouth dryness. Negative for mouth sores.   Eyes:  Positive for dryness.  Respiratory:  Negative for shortness of breath.   Cardiovascular:  Negative for chest pain and palpitations.  Gastrointestinal:  Positive for diarrhea. Negative for blood in stool and constipation.  Endocrine: Positive for increased urination.  Genitourinary:  Negative for involuntary urination.  Musculoskeletal:  Positive for joint pain, joint pain, joint swelling and morning stiffness. Negative for gait problem, myalgias, muscle weakness, muscle tenderness and myalgias.  Skin:  Positive for sensitivity to sunlight.  Negative for color change, rash and hair loss.  Allergic/Immunologic: Positive for susceptible to infections.  Neurological:  Positive for dizziness. Negative for headaches.  Hematological:  Negative for swollen glands.  Psychiatric/Behavioral:  Positive for sleep disturbance. Negative for depressed mood. The patient is not nervous/anxious.     PMFS History:  Patient Active Problem List   Diagnosis Date Noted   Polyarthralgia 09/11/2022   Obesity 04/16/2022   HTN (hypertension) 03/13/2022   Migraine 03/15/2021   Vitamin D deficiency 03/08/2020   Upper respiratory infection 01/06/2018   Cough 12/30/2017   Wheezing 12/30/2017   Rash and nonspecific skin eruption 05/27/2016   Sinusitis 10/11/2013   Factor V Leiden mutation (HCC)    Encounter for well adult exam with abnormal findings 07/06/2011   Asthma 04/10/2010   Tenosynovitis of foot and ankle 10/11/2009   Hyperlipidemia 06/24/2009   HAND PAIN, BILATERAL 06/24/2009   FATIGUE 06/24/2009   URINARY INCONTINENCE 06/24/2009   Allergic rhinitis 10/26/2007    Past Medical History:  Diagnosis Date   ALLERGIC RHINITIS 10/26/2007   ASTHMA 04/10/2010   Blood dyscrasia    factor V Leiden    Clotting disorder (HCC)    DYSLIPIDEMIA 06/24/2009   Factor V Leiden mutation (HCC)    hx of   Family history of anesthesia complication    PONV   GERD (gastroesophageal reflux disease)    Ovarian cyst    PONV (postoperative nausea and vomiting)     Family History  Problem Relation Age  of Onset   Heart disease Mother    Heart attack Mother 49   Diabetes Mother    Hypertension Mother    Lupus Mother    Lupus Maternal Aunt    Lupus Maternal Aunt    Rheum arthritis Maternal Grandmother    Hypertension Maternal Grandfather    Heart disease Maternal Grandfather    Healthy Daughter    Polycystic ovary syndrome Daughter    Healthy Daughter    Diabetes Other    Hypertension Other    Breast cancer Neg Hx    Past Surgical History:   Procedure Laterality Date   BLADDER SUSPENSION  05/26/2012   Procedure: TRANSVAGINAL TAPE (TVT) PROCEDURE;  Surgeon: Loney Laurence, MD;  Location: [facility];  Service: Gynecology;  Laterality: N/A;   BREAST BIOPSY Left 01/08/2023   MM LT BREAST BX W LOC DEV 1ST LESION IMAGE BX SPEC STEREO GUIDE 01/08/2023 [facility]   CYSTOCELE REPAIR  05/26/2012   Procedure: ANTERIOR REPAIR (CYSTOCELE);  Surgeon: Loney Laurence, MD;  Location: [facility];  Service: Gynecology;  Laterality: N/A;   CYSTOSCOPY  05/26/2012   Procedure: CYSTOSCOPY;  Surgeon: Loney Laurence, MD;  Location: [facility];  Service: Gynecology;  Laterality: N/A;   LAPAROSCOPIC ASSISTED VAGINAL HYSTERECTOMY  05/26/2012   Procedure: LAPAROSCOPIC ASSISTED VAGINAL HYSTERECTOMY;  Surgeon: Miguel Aschoff, MD;  Location: [facility];  Service: Gynecology;  Laterality: N/A;   SALPINGOOPHORECTOMY  05/26/2012   Procedure: SALPINGO OOPHERECTOMY;  Surgeon: Miguel Aschoff, MD;  Location: [facility];  Service: Gynecology;  Laterality: Left;   TONSILLECTOMY AND ADENOIDECTOMY     WISDOM TOOTH EXTRACTION     Social History   Social History Narrative   Regular exercise-yes   Husband has had a vasectomy   Immunization History  Administered Date(s) Administered   Influenza Split 09/09/2012   Influenza Whole 08/21/2010, 09/07/2019   Influenza,inj,Quad PF,6+ Mos 09/02/2016, 10/12/2018   Influenza-Unspecified 09/07/2015   PFIZER(Purple Top)SARS-COV-2 Vaccination 05/24/2020, 06/20/2020   Tdap 10/04/2017     Objective: Vital Signs: BP 130/86 (BP Location: Left Arm, Patient Position: Sitting, Cuff Size: Large)   Pulse 80   Resp 17   Ht 5\' 9"  (1.753 m)   Wt 257 lb 3.2 oz (116.7 kg)   LMP 04/16/2012   BMI 37.98 kg/m    Physical Exam   Musculoskeletal Exam: Cervical, thoracic and lumbar spine were in good range of motion.  Shoulder joints, elbow joints, wrist joints, MCPs PIPs and DIPs been good range of motion with no synovitis.  Hip joints, knee  joints were in good range of motion without any warmth swelling or effusion.  There was no tenderness over ankles or MTPs.  Bilateral mild bunions were noted.  CDAI Exam: CDAI Score: -- Patient Global: --; Provider Global: -- Swollen: --; Tender: -- Joint Exam 04/21/2023   No joint exam has been documented for this visit   There is currently no information documented on the homunculus. Go to the Rheumatology activity and complete the homunculus joint exam.  Investigation: No additional findings.  Imaging: XR Foot 2 Views Left  Result Date: 03/31/2023 First MTP narrowing and subluxation, PIP and DIP narrowing was noted.  No intertarsal, tibiotalar or subtalar joint space narrowing was noted.  Inferior and posterior calcaneal spurs were noted.  No erosive changes were noted. Impression: These findings are consistent with osteoarthritis of the foot.   XR Foot 2 Views Right  Result Date: 03/31/2023 First MTP narrowing and subluxation, PIP and DIP narrowing  was noted.  No intertarsal, tibiotalar or subtalar joint space narrowing was noted.  Inferior and posterior calcaneal spurs were noted.  No erosive changes were noted. Impression: These findings are consistent with osteoarthritis of the foot.  XR KNEE 3 VIEW RIGHT  Result Date: 03/31/2023 No medial or lateral compartment narrowing was noted.  No patellofemoral narrowing was noted.  No chondrocalcinosis was noted. Impression: Unremarkable x-rays of the knee.  XR Hand 2 View Left  Result Date: 03/31/2023 CMC, PIP and DIP narrowing was noted.  No MCP, intercarpal or radiocarpal joint space narrowing was noted.  No erosive changes were noted. Impression: These findings are consistent with early osteoarthritis of the hand.  XR Hand 2 View Right  Result Date: 03/31/2023 CMC, PIP and DIP narrowing was noted.  No MCP, intercarpal or radiocarpal joint space narrowing was noted.  No erosive changes were noted. Impression: These findings are  consistent with early osteoarthritis of the hand.   Recent Labs: Lab Results  Component Value Date   WBC 5.5 03/31/2023   HGB 14.1 03/31/2023   PLT 243 03/31/2023   NA 138 03/31/2023   K 4.1 03/31/2023   CL 104 03/31/2023   CO2 28 03/31/2023   GLUCOSE 97 03/31/2023   BUN 14 03/31/2023   CREATININE 0.81 03/31/2023   BILITOT 0.6 03/31/2023   ALKPHOS 96 09/11/2022   AST 18 03/31/2023   ALT 19 03/31/2023   PROT 6.7 03/31/2023   ALBUMIN 4.5 09/11/2022   CALCIUM 9.5 03/31/2023   GFRAA >90 05/12/2012   March 31, 2023 urine protein creatinine ratio normal, beta-2 GP 1 negative, anticardiolipin negative, lupus anticoagulant negative, C3-C4 normal, ANA 1: 160NS, SSB 4.1, (SSA, dsDNA, Smith, RNP, SCL 70 negative), anti-CCP negative, TSH normal, CK154, G6PD normal  Speciality Comments: No specialty comments available.  Procedures:  No procedures performed Allergies: Clarithromycin, Hycodan [hydrocodone bit-homatrop mbr], and Penicillins   Assessment / Plan:     Visit Diagnoses: Positive ANA (antinuclear antibody) - ANA 1: 160NS, SSB Ab positive.  History of fatigue, arthralgias, hair loss, dry eyes and photosensitivity.  I did detailed discussion with the patient regarding the lab results.  She gives history of dry mouth and dry eyes.  Possibility of Sjogren's was discussed.  Although she does not meet the criteria unless she has a salivary gland biopsy.  I discussed the option of pilocarpine use and its side effects but she declined.  I also discussed the option of hydroxychloroquine trial and side effects but she declined.  Over-the-counter products were discussed at length.  Good oral hygiene and regular visits with the dentist were discussed.  Patient denies history of cavities.  I advised her to contact me if she develops any new symptoms.  Primary osteoarthritis of both hands -she complains of discomfort in her hands.  No tenderness or synovitis was noted.  X-rays and clinical findings  are consistent with early osteoarthritis.  X-ray findings were discussed.  A handout on hand exercises was given.  Joint protection muscle strengthening was discussed.  Chronic pain of right knee - History of right knee joint pain since the injury.  X-rays were unremarkable.  X-ray findings were discussed with the patient.  A handout on knee joint exercises was given.  Primary osteoarthritis of both feet -she complains of discomfort in her ankles and her feet.  No synovitis was noted.  She had early osteophyte arthritic changes in her feet.  Proper fitting shoes were advised.  X-rays and clinical findings are consistent  with osteoarthritis.  Other fatigue - History of fatigue for many years.  Family history of systemic lupus erythematosus-mother, maternal aunts  Primary hypertension-blood pressure was normal at 130/86.  Mixed hyperlipidemia  Factor V Leiden mutation (HCC)  History of DVT (deep vein thrombosis) - She had DVT as a teenager while she was on oral contraceptives.  Anticardiolipin antibodies, beta-2 GP 1 antibodies, lupus anticoagulant were all negative.  Results were discussed with the patient.  History of gastroesophageal reflux (GERD)  Mild intermittent asthma, unspecified whether complicated  Seasonal allergic rhinitis due to other allergic trigger  Orders: No orders of the defined types were placed in this encounter.  No orders of the defined types were placed in this encounter.    Follow-Up Instructions: Return in about 1 year (around 04/20/2024) for +ANA, +La.   Pollyann Savoy, MD  Note - This record has been created using Animal nutritionist.  Chart creation errors have been sought, but may not always  have been located. Such creation errors do not reflect on  the standard of medical care.

## 2023-04-16 ENCOUNTER — Other Ambulatory Visit: Payer: Self-pay

## 2023-04-19 ENCOUNTER — Other Ambulatory Visit: Payer: Self-pay

## 2023-04-19 ENCOUNTER — Encounter: Payer: Self-pay | Admitting: Internal Medicine

## 2023-04-19 ENCOUNTER — Other Ambulatory Visit: Payer: Self-pay | Admitting: Internal Medicine

## 2023-04-21 ENCOUNTER — Encounter: Payer: Self-pay | Admitting: Rheumatology

## 2023-04-21 ENCOUNTER — Ambulatory Visit: Payer: BC Managed Care – PPO | Attending: Rheumatology | Admitting: Rheumatology

## 2023-04-21 VITALS — BP 130/86 | HR 80 | Resp 17 | Ht 69.0 in | Wt 257.2 lb

## 2023-04-21 DIAGNOSIS — Z8269 Family history of other diseases of the musculoskeletal system and connective tissue: Secondary | ICD-10-CM

## 2023-04-21 DIAGNOSIS — E782 Mixed hyperlipidemia: Secondary | ICD-10-CM

## 2023-04-21 DIAGNOSIS — M19072 Primary osteoarthritis, left ankle and foot: Secondary | ICD-10-CM

## 2023-04-21 DIAGNOSIS — R5383 Other fatigue: Secondary | ICD-10-CM

## 2023-04-21 DIAGNOSIS — M25561 Pain in right knee: Secondary | ICD-10-CM

## 2023-04-21 DIAGNOSIS — D6851 Activated protein C resistance: Secondary | ICD-10-CM

## 2023-04-21 DIAGNOSIS — Z86718 Personal history of other venous thrombosis and embolism: Secondary | ICD-10-CM

## 2023-04-21 DIAGNOSIS — R768 Other specified abnormal immunological findings in serum: Secondary | ICD-10-CM

## 2023-04-21 DIAGNOSIS — J4520 Mild intermittent asthma, uncomplicated: Secondary | ICD-10-CM

## 2023-04-21 DIAGNOSIS — M19042 Primary osteoarthritis, left hand: Secondary | ICD-10-CM

## 2023-04-21 DIAGNOSIS — J3089 Other allergic rhinitis: Secondary | ICD-10-CM

## 2023-04-21 DIAGNOSIS — M19041 Primary osteoarthritis, right hand: Secondary | ICD-10-CM | POA: Diagnosis not present

## 2023-04-21 DIAGNOSIS — M19071 Primary osteoarthritis, right ankle and foot: Secondary | ICD-10-CM

## 2023-04-21 DIAGNOSIS — I10 Essential (primary) hypertension: Secondary | ICD-10-CM

## 2023-04-21 DIAGNOSIS — Z8719 Personal history of other diseases of the digestive system: Secondary | ICD-10-CM

## 2023-04-21 DIAGNOSIS — G8929 Other chronic pain: Secondary | ICD-10-CM

## 2023-04-21 NOTE — Patient Instructions (Signed)
Hand Exercises Hand exercises can be helpful for almost anyone. These exercises can strengthen the hands, improve flexibility and movement, and increase blood flow to the hands. These results can make work and daily tasks easier. Hand exercises can be especially helpful for people who have joint pain from arthritis or have nerve damage from overuse (carpal tunnel syndrome). These exercises can also help people who have injured a hand. Exercises Most of these hand exercises are gentle stretching and motion exercises. It is usually safe to do them often throughout the day. Warming up your hands before exercise may help to reduce stiffness. You can do this with gentle massage or by placing your hands in warm water for 10-15 minutes. It is normal to feel some stretching, pulling, tightness, or mild discomfort as you begin new exercises. This will gradually improve. Stop an exercise right away if you feel sudden, severe pain or your pain gets worse. Ask your health care provider which exercises are best for you. Knuckle bend or "claw" fist  Stand or sit with your arm, hand, and all five fingers pointed straight up. Make sure to keep your wrist straight during the exercise. Gently bend your fingers down toward your palm until the tips of your fingers are touching the top of your palm. Keep your big knuckle straight and just bend the small knuckles in your fingers. Hold this position for __________ seconds. Straighten (extend) your fingers back to the starting position. Repeat this exercise 5-10 times with each hand. Full finger fist  Stand or sit with your arm, hand, and all five fingers pointed straight up. Make sure to keep your wrist straight during the exercise. Gently bend your fingers into your palm until the tips of your fingers are touching the middle of your palm. Hold this position for __________ seconds. Extend your fingers back to the starting position, stretching every joint fully. Repeat  this exercise 5-10 times with each hand. Straight fist Stand or sit with your arm, hand, and all five fingers pointed straight up. Make sure to keep your wrist straight during the exercise. Gently bend your fingers at the big knuckle, where your fingers meet your hand, and the middle knuckle. Keep the knuckle at the tips of your fingers straight and try to touch the bottom of your palm. Hold this position for __________ seconds. Extend your fingers back to the starting position, stretching every joint fully. Repeat this exercise 5-10 times with each hand. Tabletop  Stand or sit with your arm, hand, and all five fingers pointed straight up. Make sure to keep your wrist straight during the exercise. Gently bend your fingers at the big knuckle, where your fingers meet your hand, as far down as you can while keeping the small knuckles in your fingers straight. Think of forming a tabletop with your fingers. Hold this position for __________ seconds. Extend your fingers back to the starting position, stretching every joint fully. Repeat this exercise 5-10 times with each hand. Finger spread  Place your hand flat on a table with your palm facing down. Make sure your wrist stays straight as you do this exercise. Spread your fingers and thumb apart from each other as far as you can until you feel a gentle stretch. Hold this position for __________ seconds. Bring your fingers and thumb tight together again. Hold this position for __________ seconds. Repeat this exercise 5-10 times with each hand. Making circles  Stand or sit with your arm, hand, and all five fingers pointed   straight up. Make sure to keep your wrist straight during the exercise. Make a circle by touching the tip of your thumb to the tip of your index finger. Hold for __________ seconds. Then open your hand wide. Repeat this motion with your thumb and each finger on your hand. Repeat this exercise 5-10 times with each hand. Thumb  motion  Sit with your forearm resting on a table and your wrist straight. Your thumb should be facing up toward the ceiling. Keep your fingers relaxed as you move your thumb. Lift your thumb up as high as you can toward the ceiling. Hold for __________ seconds. Bend your thumb across your palm as far as you can, reaching the tip of your thumb for the small finger (pinkie) side of your palm. Hold for __________ seconds. Repeat this exercise 5-10 times with each hand. Grip strengthening  Hold a stress ball or other soft ball in the middle of your hand. Slowly increase the pressure, squeezing the ball as much as you can without causing pain. Think of bringing the tips of your fingers into the middle of your palm. All of your finger joints should bend when doing this exercise. Hold your squeeze for __________ seconds, then relax. Repeat this exercise 5-10 times with each hand. Contact a health care provider if: Your hand pain or discomfort gets much worse when you do an exercise. Your hand pain or discomfort does not improve within 2 hours after you exercise. If you have any of these problems, stop doing these exercises right away. Do not do them again unless your health care provider says that you can. Get help right away if: You develop sudden, severe hand pain or swelling. If this happens, stop doing these exercises right away. Do not do them again unless your health care provider says that you can. This information is not intended to replace advice given to you by your health care provider. Make sure you discuss any questions you have with your health care provider. Document Revised: 03/06/2021 Document Reviewed: 03/13/2021 Elsevier Patient Education  2023 [employer]  Exercises for Chronic Knee Pain Chronic knee pain is pain that lasts longer than 3 months. For most people with chronic knee pain, exercise and weight loss is an important part of treatment. Your health care provider may want  you to focus on: Strengthening the muscles that support your knee. This can take pressure off your knee and lessen pain. Preventing knee stiffness. Maintaining or increasing how far you can move your knee. Losing weight (if this applies) to take pressure off your knee, decrease your risk for injury, and make it easier for you to exercise. Your health care provider will help you develop an exercise program that matches your needs and physical abilities. Below are simple, low-impact exercises you can do at home. Ask your health care provider or a physical therapist how often you should do your exercise program and how many times to repeat each exercise. General safety tips Follow these safety tips for exercising with chronic knee pain: Get your health care provider's approval before doing any exercises. Start slowly and stop any time an exercise causes pain. Do not exercise if your knee pain is flaring up. Warm up first. Stretching a cold muscle can cause an injury. Do 5-10 minutes of easy movement or light stretching before beginning your exercise routine. Do 5-10 minutes of low-impact activity (like walking or cycling) before starting strengthening exercises. Contact your health care provider any time you have   pain during or after exercising. Exercise may cause discomfort but should not be painful. It is normal to be a little stiff or sore after exercising.  Stretching and range-of-motion exercises Front thigh stretch  Stand up straight and support your body by holding on to a chair or resting one hand on a wall. With your legs straight and close together, bend one knee to lift your heel up toward your buttocks. Using one hand for support, grab your ankle with your free hand. Pull your foot up closer toward your buttocks to feel the stretch in front of your thigh. Hold the stretch for 30 seconds. Repeat __________ times. Complete this exercise __________ times a day. Back thigh stretch  Sit  on the floor with your back straight and your legs out straight in front of you. Place the palms of your hands on the floor and slide them toward your feet as you bend at the hip. Try to touch your nose to your knees and feel the stretch in the back of your thighs. Hold for 30 seconds. Repeat __________ times. Complete this exercise __________ times a day. Calf stretch  Stand facing a wall. Place the palms of your hands flat against the wall, arms extended, and lean slightly against the wall. Get into a lunge position with one leg bent at the knee and the other leg stretched out straight behind you. Keep both feet facing the wall and increase the bend in your knee while keeping the heel of the other leg flat on the ground. You should feel the stretch in your calf. Hold for 30 seconds. Repeat __________ times. Complete this exercise __________ times a day. Strengthening exercises Straight leg lift Lie on your back with one knee bent and the other leg out straight. Slowly lift the straight leg without bending the knee. Lift until your foot is about 12 inches (30 cm) off the floor. Hold for 3-5 seconds and slowly lower your leg. Repeat __________ times. Complete this exercise __________ times a day. Single leg dip Stand between two chairs and put both hands on the backs of the chairs for support. Extend one leg out straight with your body weight resting on the heel of the standing leg. Slowly bend your standing knee to dip your body to the level that is comfortable for you. Hold for 3-5 seconds. Repeat __________ times. Complete this exercise __________ times a day. Hamstring curls Stand straight, knees close together, facing the back of a chair. Hold on to the back of a chair with both hands. Keep one leg straight. Bend the other knee while bringing the heel up toward the buttock until the knee is bent at a 90-degree angle (right angle). Hold for 3-5 seconds. Repeat __________ times.  Complete this exercise __________ times a day. Wall squat Stand straight with your back, hips, and head against a wall. Step forward one foot at a time with your back still against the wall. Your feet should be 2 feet (61 cm) from the wall at shoulder width. Keeping your back, hips, and head against the wall, slide down the wall to as close of a sitting position as you can get. Hold for 5-10 seconds, then slowly slide back up. Repeat __________ times. Complete this exercise __________ times a day. Step-ups Step up with one foot onto a sturdy platform or stool that is about 6 inches (15 cm) high. Face sideways with one foot on the platform and one on the ground. Place all your weight   on the platform foot and lift your body off the ground until your knee extends. Let your other leg hang free to the side. Hold for 3-5 seconds then slowly lower your weight down to the floor foot. Repeat __________ times. Complete this exercise __________ times a day. Contact a health care provider if: Your exercise causes pain. Your pain is worse after you exercise. Your pain prevents you from doing your exercises. This information is not intended to replace advice given to you by your health care provider. Make sure you discuss any questions you have with your health care provider. Document Revised: 03/28/2020 Document Reviewed: 11/20/2019 Elsevier Patient Education  2023 [employer]  

## 2023-04-26 ENCOUNTER — Telehealth: Payer: Self-pay | Admitting: Internal Medicine

## 2023-04-26 ENCOUNTER — Ambulatory Visit (INDEPENDENT_AMBULATORY_CARE_PROVIDER_SITE_OTHER): Payer: BC Managed Care – PPO | Admitting: Internal Medicine

## 2023-04-26 ENCOUNTER — Encounter: Payer: Self-pay | Admitting: Internal Medicine

## 2023-04-26 VITALS — BP 124/78 | HR 97 | Temp 98.1°F | Ht 69.0 in | Wt 258.0 lb

## 2023-04-26 DIAGNOSIS — Z86718 Personal history of other venous thrombosis and embolism: Secondary | ICD-10-CM

## 2023-04-26 DIAGNOSIS — Z1159 Encounter for screening for other viral diseases: Secondary | ICD-10-CM | POA: Diagnosis not present

## 2023-04-26 DIAGNOSIS — J0101 Acute recurrent maxillary sinusitis: Secondary | ICD-10-CM | POA: Diagnosis not present

## 2023-04-26 DIAGNOSIS — Z0001 Encounter for general adult medical examination with abnormal findings: Secondary | ICD-10-CM

## 2023-04-26 DIAGNOSIS — D6851 Activated protein C resistance: Secondary | ICD-10-CM | POA: Diagnosis not present

## 2023-04-26 DIAGNOSIS — E559 Vitamin D deficiency, unspecified: Secondary | ICD-10-CM

## 2023-04-26 DIAGNOSIS — R6889 Other general symptoms and signs: Secondary | ICD-10-CM

## 2023-04-26 DIAGNOSIS — I872 Venous insufficiency (chronic) (peripheral): Secondary | ICD-10-CM | POA: Diagnosis not present

## 2023-04-26 DIAGNOSIS — I10 Essential (primary) hypertension: Secondary | ICD-10-CM

## 2023-04-26 DIAGNOSIS — J4520 Mild intermittent asthma, uncomplicated: Secondary | ICD-10-CM

## 2023-04-26 DIAGNOSIS — J069 Acute upper respiratory infection, unspecified: Secondary | ICD-10-CM | POA: Insufficient documentation

## 2023-04-26 LAB — POC COVID19 BINAXNOW: SARS Coronavirus 2 Ag: NEGATIVE

## 2023-04-26 MED ORDER — DOXYCYCLINE HYCLATE 100 MG PO TABS: 100.0000 mg | ORAL_TABLET | Freq: Two times a day (BID) | ORAL | 0 refills | Status: DC

## 2023-04-26 MED ORDER — HYDROCHLOROTHIAZIDE 12.5 MG PO CAPS: 12.5000 mg | ORAL_CAPSULE | Freq: Every day | ORAL | 3 refills | Status: DC

## 2023-04-26 MED ORDER — PROMETHAZINE-CODEINE 6.25-10 MG/5ML PO SYRP: 5.0000 mL | ORAL_SOLUTION | ORAL | 0 refills | Status: DC | PRN

## 2023-04-26 NOTE — Assessment & Plan Note (Signed)
Age and sex appropriate education and counseling updated with regular exercise and diet Referrals for preventative services - for Egd and colonoscopy soon with Dr Marina Goodell, for hep c screen Immunizations addressed - none needed Smoking counseling  - none needed Evidence for depression or other mood disorder - none significant Most recent labs reviewed. I have personally reviewed and have noted: 1) the patient's medical and social history 2) The patient's current medications and supplements 3) The patient's height, weight, and BMI have been recorded in the chart

## 2023-04-26 NOTE — Assessment & Plan Note (Signed)
Related to obesity, hx of Dvt, and standing teaching many yrs - encouraged compression stockings bilat, leg elevation with sitting, low salt diet, and hct 12.5 qd pn

## 2023-04-26 NOTE — Telephone Encounter (Signed)
Patient want to speak with a nurse in regards tomorrow procedure. Patient stated she is sick and not sure if she should  keep tomorrows appt. Please advise.

## 2023-04-26 NOTE — Assessment & Plan Note (Signed)
Stable overall, continue inhaler prn 

## 2023-04-26 NOTE — Patient Instructions (Addendum)
Your COVID testing is negative  Please make sure to follow up on the scheduling of the Endoscopy and Colonoscopy with Dr Marina Goodell  Please take all new medication as prescribed - the antibiotic, and cough medicine as needed  Please take all new medication as prescribed - the HCT 12.5 mg per day mild fluid pill  Please continue all other medications as before, and refills have been done if requested.  Please have the pharmacy call with any other refills you may need.  Please continue your efforts at being more active, low cholesterol diet, and weight control.  You are otherwise up to date with prevention measures today.  Please keep your appointments with your specialists as you may have planned  You will be contacted regarding the referral for: Hematology  No further lab testing needed today  Please make an Appointment to return for your 1 year visit, or sooner if needed, with Lab testing by Appointment as well, to be done about 3-5 days before at the FIRST FLOOR Lab (so this is for TWO appointments - please see the scheduling desk as you leave)

## 2023-04-26 NOTE — Assessment & Plan Note (Signed)
For heme referral r/o need for ongoing anticoagulation

## 2023-04-26 NOTE — Assessment & Plan Note (Signed)
Last vitamin D Lab Results  Component Value Date   VD25OH 58.98 09/11/2022   Stable, cont oral replacement

## 2023-04-26 NOTE — Assessment & Plan Note (Signed)
Mild to mod, for antibx course doxycycline 100 bid, cough med prn,,  to f/u any worsening symptoms or concerns  

## 2023-04-26 NOTE — Telephone Encounter (Signed)
Spoke with patient who has sinue infection symptoms and a cough.  She sounds unwell on the phone and has a doctor's appt this afternoon.   We decided to move her egd to give her a chance to get better.   Rescheduled to 05/26/2023.  Reviewed the new instructions

## 2023-04-26 NOTE — Assessment & Plan Note (Signed)
BP Readings from Last 3 Encounters:  04/26/23 124/78  04/21/23 130/86  03/31/23 128/89   Stable, pt to continue medical treatment hct 12.5 qd, losartan 50 qd

## 2023-04-26 NOTE — Progress Notes (Signed)
Patient ID: Robin Ayers, female   DOB: 12-29-1975, 47 y.o.   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 female here for wellness exam; Also due for Egd and colonoscopy soon with Dr Marina Goodell. Due for hep c screening, o/w up to date  Also, Pt denies chest pain, increased sob or doe, wheezing, orthopnea, PND, palpitations, dizziness or syncope, but does have mild worsening venous peripheral edema to the ankles left > right.   Pt denies polydipsia, polyuria, or new focal neuro s/s.    Pt denies fever, wt loss, night sweats, loss of appetite, or other constitutional symptoms  Has hx of DVT at 47 yo while on BCP, also with + Factor V Leiden mutation, wondering if needs anticoagulation.   Also  Here with 2-3 days acute onset fever, facial pain, pressure, headache, general weakness and malaise, and greenish d/c, bilat pink eyes, mild ST and cough, but pt denies chest pain, wheezing, increased sob or doe, orthopnea, PND, increased LE swelling, palpitations, dizziness or syncope.     Wt Readings from Last 3 Encounters:  04/26/23 258 lb (117 kg)  04/21/23 257 lb 3.2 oz (116.7 kg)  03/31/23 256 lb (116.1 kg)   BP Readings from Last 3 Encounters:  04/26/23 124/78  04/21/23 130/86  03/31/23 128/89   Immunization History  Administered Date(s) Administered   Influenza Split 09/09/2012   Influenza Whole 08/21/2010, 09/07/2019   Influenza,inj,Quad PF,6+ Mos 09/02/2016, 10/12/2018   Influenza-Unspecified 09/07/2015   PFIZER(Purple Top)SARS-COV-2 Vaccination 05/24/2020, 06/20/2020   Tdap 10/04/2017   Health Maintenance Due  Topic Date Due   Hepatitis C Screening  Never done   COLONOSCOPY (Pts 45-40yrs Insurance  coverage will need to be confirmed)  Never done      Past Medical History:  Diagnosis Date   ALLERGIC RHINITIS 10/26/2007   ASTHMA 04/10/2010   Blood dyscrasia    factor V Leiden    Clotting disorder (HCC)    DYSLIPIDEMIA 06/24/2009   Factor V Leiden mutation (HCC)    hx of   Family history of anesthesia complication    PONV   GERD (gastroesophageal reflux disease)    Ovarian cyst    PONV (postoperative nausea and vomiting)    Past Surgical History:  Procedure Laterality Date   BLADDER SUSPENSION  05/26/2012   Procedure: TRANSVAGINAL TAPE (TVT) PROCEDURE;  Surgeon: Loney Laurence, MD;  Location: [facility];  Service: Gynecology;  Laterality: N/A;   BREAST BIOPSY Left 01/08/2023   MM LT BREAST BX W LOC DEV 1ST LESION IMAGE BX SPEC STEREO GUIDE 01/08/2023 [facility]   CYSTOCELE REPAIR  05/26/2012   Procedure: ANTERIOR REPAIR (CYSTOCELE);  Surgeon: Loney Laurence, MD;  Location: [facility];  Service: Gynecology;  Laterality: N/A;   CYSTOSCOPY  05/26/2012   Procedure: CYSTOSCOPY;  Surgeon: Loney Laurence, MD;  Location: [facility];  Service: Gynecology;  Laterality: N/A;   LAPAROSCOPIC ASSISTED VAGINAL HYSTERECTOMY  05/26/2012   Procedure: LAPAROSCOPIC ASSISTED VAGINAL HYSTERECTOMY;  Surgeon: Miguel Aschoff, MD;  Location: [facility];  Service: Gynecology;  Laterality: N/A;   SALPINGOOPHORECTOMY  05/26/2012   Procedure: SALPINGO OOPHERECTOMY;  Surgeon: Miguel Aschoff,  MD;  Location: [facility];  Service: Gynecology;  Laterality: Left;   TONSILLECTOMY AND ADENOIDECTOMY     WISDOM TOOTH EXTRACTION      reports that she has quit smoking. She has never been exposed to tobacco smoke. She has never used smokeless tobacco. She reports that she does not drink alcohol and does not use drugs. family history includes Diabetes in her mother and another family member; Healthy in her daughter and daughter; Heart attack (age of onset: 44) in her mother; Heart disease in her maternal grandfather and mother;  Hypertension in her maternal grandfather, mother, and another family member; Lupus in her maternal aunt, maternal aunt, and mother; Polycystic ovary syndrome in her daughter; Rheum arthritis in her maternal grandmother. Allergies  Allergen Reactions   Clarithromycin     REACTION: GI upset   Hycodan [Hydrocodone Bit-Homatrop Mbr]     migraine   Penicillins     REACTION: rash   Current Outpatient Medications on File Prior to Visit  Medication Sig Dispense Refill   albuterol (PROAIR HFA) 108 (90 Base) MCG/ACT inhaler TAKE 2 PUFFS BY MOUTH EVERY 6 HOURS AS NEEDED FOR WHEEZE OR SHORTNESS OF BREATH 25.5 each 3   aspirin 81 MG EC tablet Take 1 tablet (81 mg total) by mouth daily. Swallow whole. 30 tablet 12   atorvastatin (LIPITOR) 40 MG tablet TAKE 1 TABLET BY MOUTH EVERY DAY 90 tablet 3   budesonide-formoterol (SYMBICORT) 160-4.5 MCG/ACT inhaler Inhale 2 puffs into the lungs in the morning and at bedtime. 10.2 g 11   cetirizine (ZYRTEC) 10 MG tablet Take 10 mg by mouth daily.     losartan (COZAAR) 50 MG tablet TAKE 1 TABLET BY MOUTH EVERY DAY 90 tablet 3   montelukast (SINGULAIR) 10 MG tablet TAKE 1 TABLET BY MOUTH EVERYDAY AT BEDTIME 90 tablet 3   Multiple Vitamin (MULTIVITAMIN) tablet Take 1 tablet by mouth daily.     Omega-3 Fatty Acids (FISH OIL PO) Take by mouth daily.     pantoprazole (PROTONIX) 40 MG tablet Take 1 tablet (40 mg total) by mouth daily. 30 tablet 11   Probiotic Product (PROBIOTIC PO) Take by mouth daily.     rizatriptan (MAXALT-MLT) 10 MG disintegrating tablet Take 1 tablet (10 mg total) by mouth as needed for migraine. May repeat in 2 hours if needed 10 tablet 11   Diethylpropion HCl CR 75 MG TB24 Take by mouth. (Patient not taking: Reported on 03/31/2023)     spironolactone (ALDACTONE) 50 MG tablet Take 50 mg by mouth daily. (Patient not taking: Reported on 04/21/2023)     No current facility-administered medications on file prior to visit.        ROS:  All others  reviewed and negative.  Objective        PE:  BP 124/78 (BP Location: Right Arm, Patient Position: Sitting, Cuff Size: Normal)   Pulse 97   Temp 98.1 F (36.7 C) (Oral)   Ht 5\' 9"  (1.753 m)   Wt 258 lb (117 kg)   LMP 04/16/2012   SpO2 98%   BMI 38.10 kg/m                 Constitutional: Pt appears in NAD               HENT: Head: NCAT.                Right Ear: External ear normal.  Left Ear: External ear normal. Bilat tm's with mild erythema.  Max sinus areas mild tender.  Pharynx with mild erythema, no exudate               Eyes: . Pupils are equal, round, and reactive to light. Conjunctivae and EOM are normal               Nose: without d/c or deformity               Neck: Neck supple. Gross normal ROM               Cardiovascular: Normal rate and regular rhythm.                 Pulmonary/Chest: Effort normal and breath sounds without rales or wheezing.                Abd:  Soft, NT, ND, + BS, no organomegaly               Neurological: Pt is alert. At baseline orientation, motor grossly intact               Skin: Skin is warm. No rashes, no other new lesions, LE edema - trace to 1+ bilateral left > right               Psychiatric: Pt behavior is normal without agitation   Micro: none  Cardiac tracings I have personally interpreted today:  none  Pertinent Radiological findings (summarize): none   Lab Results  Component Value Date   WBC 5.5 03/31/2023   HGB 14.1 03/31/2023   HCT 42.3 03/31/2023   PLT 243 03/31/2023   GLUCOSE 97 03/31/2023   CHOL 153 09/11/2022   TRIG 121.0 09/11/2022   HDL 50.20 09/11/2022   LDLDIRECT 130.0 03/04/2020   LDLCALC 79 09/11/2022   ALT 19 03/31/2023   AST 18 03/31/2023   NA 138 03/31/2023   K 4.1 03/31/2023   CL 104 03/31/2023   CREATININE 0.81 03/31/2023   BUN 14 03/31/2023   CO2 28 03/31/2023   TSH 1.68 03/31/2023   INR 0.87 05/12/2012   HGBA1C 5.8 09/11/2022   POCT - covid neg  Assessment/Plan:  Sakai Strande  Murphy-Justice is a 47 y.o. White or Caucasian [1] female with  has a past medical history of ALLERGIC RHINITIS (10/26/2007), ASTHMA (04/10/2010), Blood dyscrasia, Clotting disorder (HCC), DYSLIPIDEMIA (06/24/2009), Factor V Leiden mutation (HCC), Family history of anesthesia complication, GERD (gastroesophageal reflux disease), Ovarian cyst, and PONV (postoperative nausea and vomiting).  Encounter for well adult exam with abnormal findings Age and sex appropriate education and counseling updated with regular exercise and diet Referrals for preventative services - for Egd and colonoscopy soon with Dr Marina Goodell, for hep c screen Immunizations addressed - none needed Smoking counseling  - none needed Evidence for depression or other mood disorder - none significant Most recent labs reviewed. I have personally reviewed and have noted: 1) the patient's medical and social history 2) The patient's current medications and supplements 3) The patient's height, weight, and BMI have been recorded in the chart   Factor V Leiden mutation (HCC) For heme referral r/o need for ongoing anticoagulation  Asthma Stable overall, continue inhaler prn  Sinusitis Mild to mod, for antibx course doxycycline 100 bid, cough med prn,,  to f/u any worsening symptoms or concerns  Vitamin D deficiency Last vitamin D Lab Results  Component Value Date   VD25OH 58.98 09/11/2022   Stable,  cont oral replacement   HTN (hypertension) BP Readings from Last 3 Encounters:  04/26/23 124/78  04/21/23 130/86  03/31/23 128/89   Stable, pt to continue medical treatment hct 12.5 qd, losartan 50 qd   Venous insufficiency Related to obesity, hx of Dvt, and standing teaching many yrs - encouraged compression stockings bilat, leg elevation with sitting, low salt diet, and hct 12.5 qd pn  Followup: Return in about 1 year (around 04/25/2024).  Oliver Barre, MD 04/26/2023 8:09 PM [facility]  [facility]

## 2023-04-27 ENCOUNTER — Encounter: Payer: BC Managed Care – PPO | Admitting: Internal Medicine

## 2023-04-27 ENCOUNTER — Telehealth: Payer: Self-pay | Admitting: Hematology and Oncology

## 2023-04-27 NOTE — Telephone Encounter (Signed)
scheduled per referral, pt has been called and confirmed date and time. Pt is aware of location and to arrive early for check in   

## 2023-04-29 ENCOUNTER — Other Ambulatory Visit: Payer: Self-pay | Admitting: Internal Medicine

## 2023-04-29 MED ORDER — GUAIFENESIN-DM 100-10 MG/5ML PO SYRP: 5.0000 mL | ORAL_SOLUTION | ORAL | 0 refills | Status: DC | PRN

## 2023-05-21 ENCOUNTER — Inpatient Hospital Stay: Payer: BC Managed Care – PPO | Attending: Hematology and Oncology | Admitting: Hematology and Oncology

## 2023-05-21 ENCOUNTER — Other Ambulatory Visit: Payer: Self-pay

## 2023-05-21 ENCOUNTER — Inpatient Hospital Stay: Payer: BC Managed Care – PPO

## 2023-05-21 VITALS — BP 124/87 | HR 90 | Temp 97.9°F | Resp 18 | Ht 69.0 in | Wt 256.4 lb

## 2023-05-21 DIAGNOSIS — D6851 Activated protein C resistance: Secondary | ICD-10-CM | POA: Diagnosis present

## 2023-05-21 DIAGNOSIS — Z87891 Personal history of nicotine dependence: Secondary | ICD-10-CM | POA: Insufficient documentation

## 2023-05-21 DIAGNOSIS — Z8249 Family history of ischemic heart disease and other diseases of the circulatory system: Secondary | ICD-10-CM | POA: Insufficient documentation

## 2023-05-21 DIAGNOSIS — Z86718 Personal history of other venous thrombosis and embolism: Secondary | ICD-10-CM | POA: Insufficient documentation

## 2023-05-21 DIAGNOSIS — Z79899 Other long term (current) drug therapy: Secondary | ICD-10-CM | POA: Insufficient documentation

## 2023-05-21 DIAGNOSIS — Z7982 Long term (current) use of aspirin: Secondary | ICD-10-CM | POA: Diagnosis not present

## 2023-05-24 ENCOUNTER — Telehealth: Payer: Self-pay | Admitting: Internal Medicine

## 2023-05-24 ENCOUNTER — Other Ambulatory Visit: Payer: Self-pay | Admitting: Urology

## 2023-05-24 DIAGNOSIS — D3000 Benign neoplasm of unspecified kidney: Secondary | ICD-10-CM

## 2023-05-24 DIAGNOSIS — D4102 Neoplasm of uncertain behavior of left kidney: Secondary | ICD-10-CM

## 2023-05-24 NOTE — Telephone Encounter (Signed)
Inbound call from patient stating that she Is scheduled for an EGD on 6/19 at 10:00 with Dr. Marina Goodell. Patient stated she has a cough and a sore throat and  requesting a call back to discuss if she can proceed with the procedure. Please advise.

## 2023-05-24 NOTE — Telephone Encounter (Signed)
Patient recently had a sinus infection and symptoms have returned.  We rescheduled her egd  to 06/2023 just to give her time to recover completely.  Reviewed new times with the instructions.

## 2023-05-26 ENCOUNTER — Encounter: Payer: Self-pay | Admitting: Internal Medicine

## 2023-05-26 ENCOUNTER — Encounter: Payer: BC Managed Care – PPO | Admitting: Internal Medicine

## 2023-05-26 ENCOUNTER — Ambulatory Visit (INDEPENDENT_AMBULATORY_CARE_PROVIDER_SITE_OTHER): Payer: BC Managed Care – PPO | Admitting: Internal Medicine

## 2023-05-26 VITALS — BP 132/78 | HR 87 | Temp 98.9°F | Ht 69.0 in | Wt 255.0 lb

## 2023-05-26 DIAGNOSIS — J4520 Mild intermittent asthma, uncomplicated: Secondary | ICD-10-CM | POA: Diagnosis not present

## 2023-05-26 DIAGNOSIS — E559 Vitamin D deficiency, unspecified: Secondary | ICD-10-CM | POA: Diagnosis not present

## 2023-05-26 DIAGNOSIS — J069 Acute upper respiratory infection, unspecified: Secondary | ICD-10-CM

## 2023-05-26 DIAGNOSIS — I10 Essential (primary) hypertension: Secondary | ICD-10-CM | POA: Diagnosis not present

## 2023-05-26 DIAGNOSIS — H109 Unspecified conjunctivitis: Secondary | ICD-10-CM | POA: Insufficient documentation

## 2023-05-26 DIAGNOSIS — H1032 Unspecified acute conjunctivitis, left eye: Secondary | ICD-10-CM | POA: Diagnosis not present

## 2023-05-26 MED ORDER — PROMETHAZINE-DM 6.25-15 MG/5ML PO SYRP: 5.0000 mL | ORAL_SOLUTION | Freq: Four times a day (QID) | ORAL | 0 refills | Status: DC | PRN

## 2023-05-26 MED ORDER — DOXYCYCLINE HYCLATE 100 MG PO TABS: 100.0000 mg | ORAL_TABLET | Freq: Two times a day (BID) | ORAL | 0 refills | Status: DC

## 2023-05-26 MED ORDER — ERYTHROMYCIN 5 MG/GM OP OINT: 1.0000 | TOPICAL_OINTMENT | Freq: Three times a day (TID) | OPHTHALMIC | 0 refills | Status: AC

## 2023-05-26 NOTE — Patient Instructions (Signed)
Please take all new medication as prescribed - the pill antibiotic, cough medicine, eye antibiotic ointment  You can also take Mucinex (or it's generic off brand) for congestion, and tylenol as needed for pain.  Please continue all other medications as before, and refills have been done if requested.  Please have the pharmacy call with any other refills you may need.  Please keep your appointments with your specialists as you may have planned

## 2023-05-26 NOTE — Progress Notes (Signed)
Patient ID: Robin Ayers, female   DOB: 01/02/1976, 47 y.o.   MRN: [id number]        Chief Complaint: follow up uri with conjunctivitis, htn, low vit d, asthma       HPI:  Robin Ayers is a 47 y.o. female  Here with 2-3 days acute onset fever, facial pain, pressure, headache, general weakness and malaise, and greenish d/c, with mild ST and cough, but pt denies chest pain, wheezing, increased sob or doe, orthopnea, PND, increased LE swelling, palpitations, dizziness or syncope, but does have left conjunctivitis with redness and slight d/c as well, but no vision change or swelling about the eye.         Wt Readings from Last 3 Encounters:  05/26/23 255 lb (115.7 kg)  05/21/23 256 lb 6.4 oz (116.3 kg)  04/26/23 258 lb (117 kg)   BP Readings from Last 3 Encounters:  05/26/23 132/78  05/21/23 124/87  04/26/23 124/78         Past Medical History:  Diagnosis Date   ALLERGIC RHINITIS 10/26/2007   ASTHMA 04/10/2010   Blood dyscrasia    factor V Leiden    Clotting disorder (HCC)    DYSLIPIDEMIA 06/24/2009   Factor V Leiden mutation (HCC)    hx of   Family history of anesthesia complication    PONV   GERD (gastroesophageal reflux disease)    Ovarian cyst    PONV (postoperative nausea and vomiting)    Past Surgical History:  Procedure Laterality Date   BLADDER SUSPENSION  05/26/2012   Procedure: TRANSVAGINAL TAPE (TVT) PROCEDURE;  Surgeon: Loney Laurence, MD;  Location: [facility];  Service: Gynecology;  Laterality: N/A;   BREAST BIOPSY Left 01/08/2023   MM LT BREAST BX W LOC DEV 1ST LESION IMAGE BX SPEC STEREO GUIDE 01/08/2023 [facility]   CYSTOCELE REPAIR  05/26/2012   Procedure: ANTERIOR REPAIR (CYSTOCELE);  Surgeon: Loney Laurence, MD;  Location: [facility];  Service: Gynecology;  Laterality: N/A;   CYSTOSCOPY  05/26/2012   Procedure: CYSTOSCOPY;  Surgeon: Loney Laurence, MD;  Location: [facility];  Service: Gynecology;  Laterality: N/A;    LAPAROSCOPIC ASSISTED VAGINAL HYSTERECTOMY  05/26/2012   Procedure: LAPAROSCOPIC ASSISTED VAGINAL HYSTERECTOMY;  Surgeon: Miguel Aschoff, MD;  Location: [facility];  Service: Gynecology;  Laterality: N/A;   SALPINGOOPHORECTOMY  05/26/2012   Procedure: SALPINGO OOPHERECTOMY;  Surgeon: Miguel Aschoff, MD;  Location: [facility];  Service: Gynecology;  Laterality: Left;   TONSILLECTOMY AND ADENOIDECTOMY     WISDOM TOOTH EXTRACTION      reports that she has quit smoking. She has never been exposed to tobacco smoke. She has never used smokeless tobacco. She reports that she does not drink alcohol and does not use drugs. family history includes Diabetes in her mother and another family member; Healthy in her daughter and daughter; Heart attack (age of onset: 93) in her mother; Heart disease in her maternal grandfather and mother; Hypertension in her maternal grandfather, mother, and another family member; Lupus in her maternal aunt, maternal aunt, and mother; Polycystic ovary syndrome in her daughter; Rheum arthritis in her maternal grandmother. Allergies  Allergen Reactions   Clarithromycin     REACTION: GI upset   Hycodan [Hydrocodone Bit-Homatrop Mbr]     migraine   Penicillins     REACTION: rash   Current Outpatient Medications on File Prior to Visit  Medication Sig Dispense Refill   albuterol (PROAIR HFA) 108 (90 Base) MCG/ACT inhaler TAKE  2 PUFFS BY MOUTH EVERY 6 HOURS AS NEEDED FOR WHEEZE OR SHORTNESS OF BREATH 25.5 each 3   aspirin 81 MG EC tablet Take 1 tablet (81 mg total) by mouth daily. Swallow whole. 30 tablet 12   atorvastatin (LIPITOR) 40 MG tablet TAKE 1 TABLET BY MOUTH EVERY DAY 90 tablet 3   budesonide-formoterol (SYMBICORT) 160-4.5 MCG/ACT inhaler Inhale 2 puffs into the lungs in the morning and at bedtime. 10.2 g 11   cetirizine (ZYRTEC) 10 MG tablet Take 10 mg by mouth daily.     hydrochlorothiazide (MICROZIDE) 12.5 MG capsule Take 1 capsule (12.5 mg total) by mouth daily. 90 capsule 3    losartan (COZAAR) 50 MG tablet TAKE 1 TABLET BY MOUTH EVERY DAY 90 tablet 3   montelukast (SINGULAIR) 10 MG tablet TAKE 1 TABLET BY MOUTH EVERYDAY AT BEDTIME 90 tablet 3   Multiple Vitamin (MULTIVITAMIN) tablet Take 1 tablet by mouth daily.     Omega-3 Fatty Acids (FISH OIL PO) Take by mouth daily.     pantoprazole (PROTONIX) 40 MG tablet Take 1 tablet (40 mg total) by mouth daily. 30 tablet 11   Probiotic Product (PROBIOTIC PO) Take by mouth daily.     rizatriptan (MAXALT-MLT) 10 MG disintegrating tablet Take 1 tablet (10 mg total) by mouth as needed for migraine. May repeat in 2 hours if needed 10 tablet 11   Diethylpropion HCl CR 75 MG TB24 Take by mouth. (Patient not taking: Reported on 03/31/2023)     No current facility-administered medications on file prior to visit.        ROS:  All others reviewed and negative.  Objective        PE:  BP 132/78 (BP Location: Right Arm, Patient Position: Sitting, Cuff Size: Normal)   Pulse 87   Temp 98.9 F (37.2 C) (Oral)   Ht 5\' 9"  (1.753 m)   Wt 255 lb (115.7 kg)   LMP 04/16/2012   SpO2 97%   BMI 37.66 kg/m                 Constitutional: Pt appears in NAD               HENT: Head: NCAT.                Right Ear: External ear normal.                 Left Ear: External ear normal. Bilat tm's with mild erythema.  Max sinus areas mild tender.  Pharynx with mild erythema, no exudate               Eyes: . Pupils are equal, round, and reactive to light. Conjunctivae left with erythema and EOM are normal               Nose: without d/c or deformity               Neck: Neck supple. Gross normal ROM               Cardiovascular: Normal rate and regular rhythm.                 Pulmonary/Chest: Effort normal and breath sounds without rales or wheezing.                Abd:  Soft, NT, ND, + BS, no organomegaly               Neurological: Pt is alert. At  baseline orientation, motor grossly intact               Skin: Skin is warm. No rashes, no other  new lesions, LE edema - none               Psychiatric: Pt behavior is normal without agitation   Micro: none  Cardiac tracings I have personally interpreted today:  none  Pertinent Radiological findings (summarize): none   Lab Results  Component Value Date   WBC 5.5 03/31/2023   HGB 14.1 03/31/2023   HCT 42.3 03/31/2023   PLT 243 03/31/2023   GLUCOSE 97 03/31/2023   CHOL 153 09/11/2022   TRIG 121.0 09/11/2022   HDL 50.20 09/11/2022   LDLDIRECT 130.0 03/04/2020   LDLCALC 79 09/11/2022   ALT 19 03/31/2023   AST 18 03/31/2023   NA 138 03/31/2023   K 4.1 03/31/2023   CL 104 03/31/2023   CREATININE 0.81 03/31/2023   BUN 14 03/31/2023   CO2 28 03/31/2023   TSH 1.68 03/31/2023   INR 0.87 05/12/2012   HGBA1C 5.8 09/11/2022   Assessment/Plan:  Hiromi Knodel Murphy-Justice is a 47 y.o. White or Caucasian [1] female with  has a past medical history of ALLERGIC RHINITIS (10/26/2007), ASTHMA (04/10/2010), Blood dyscrasia, Clotting disorder (HCC), DYSLIPIDEMIA (06/24/2009), Factor V Leiden mutation (HCC), Family history of anesthesia complication, GERD (gastroesophageal reflux disease), Ovarian cyst, and PONV (postoperative nausea and vomiting).  Asthma Stable, cont inhaler prn  Vitamin D deficiency Last vitamin D Lab Results  Component Value Date   VD25OH 58.98 09/11/2022   Stable, cont oral replacement   HTN (hypertension) BP Readings from Last 3 Encounters:  05/26/23 132/78  05/21/23 124/87  04/26/23 124/78   Stable, pt to continue medical treatment losartan 50 every day, hct 12.5 qd   Acute upper respiratory infection Mild to mod, for antibx course,  to f/u any worsening symptoms or concerns  Left conjunctivitis Mild to mod, for antibx course eryth op,  to f/u any worsening symptoms or concerns  Followup: Return if symptoms worsen or fail to improve.  Oliver Barre, MD 05/29/2023 4:29 PM [facility]

## 2023-05-29 ENCOUNTER — Encounter: Payer: Self-pay | Admitting: Internal Medicine

## 2023-05-29 NOTE — Assessment & Plan Note (Signed)
Mild to mod, for antibx course eryth op,  to f/u any worsening symptoms or concerns

## 2023-05-29 NOTE — Assessment & Plan Note (Signed)
Mild to mod, for antibx course,  to f/u any worsening symptoms or concerns 

## 2023-05-29 NOTE — Assessment & Plan Note (Signed)
Stable , cont inhaler prn 

## 2023-05-29 NOTE — Assessment & Plan Note (Signed)
Last vitamin D Lab Results  Component Value Date   VD25OH 58.98 09/11/2022   Stable, cont oral replacement  

## 2023-05-29 NOTE — Assessment & Plan Note (Signed)
BP Readings from Last 3 Encounters:  05/26/23 132/78  05/21/23 124/87  04/26/23 124/78   Stable, pt to continue medical treatment losartan 50 every day, hct 12.5 qd

## 2023-06-01 NOTE — Progress Notes (Signed)
[facility] [phone]   [phone]  INITIAL CONSULT NOTE  Patient Care Team: Corwin Levins, MD as PCP - General (Internal Medicine)  Hematological/Oncological History # Factor V Leiden 1994: Lower extremity blood clot in the setting of estrogen-based birth control. 06/01/2023: establish care with Dr. Leonides Schanz   CHIEF COMPLAINTS/PURPOSE OF CONSULTATION:  "Factor V Leiden "  HISTORY OF PRESENTING ILLNESS:  Robin Ayers 47 y.o. female with medical history significant for a lower extremity VTE in the setting of estrogen therapy and known history of Factor V Leiden.  On exam today Robin Ayers reports that she was initially diagnosed with a blood clot in 1994 when she was 47 years old.  It was in her right leg and she was on birth control therapy at that time.  She completed 6 to 12 months of Coumadin therapy.  Subsequently when she became pregnant with her second daughter she was started on Lovenox therapy for the duration of the pregnancy.  She reports that she tolerated anticoagulation therapy well with no bleeding with some occasional bruising.  She notes that she has never had a subsequent VTE.  At the moment she has no signs or symptoms concerning for blood clot such as leg pain, leg swelling, shortness of breath, or chest pain.  On further discussion she reports that she is a former smoker having quit in 2005.  She notes that she is not actively consuming alcohol.  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  She notes that her mother just had a lung blood clot about 3 years ago and had IVC filter placed.  She has a sister that is had no health issues.  Her mother is also had a stroke and her paternal grandfather had a heart attack.  She notes that she has 2 daughters 1 of whom has been tested for factor V Leiden.  Otherwise she denies any fevers, chills, sweats, nausea, vomiting or diarrhea.  A full  10 point ROS is otherwise negative.  MEDICAL HISTORY:  Past Medical History:  Diagnosis Date   ALLERGIC RHINITIS 10/26/2007   ASTHMA 04/10/2010   Blood dyscrasia    factor V Leiden    Clotting disorder (HCC)    DYSLIPIDEMIA 06/24/2009   Factor V Leiden mutation (HCC)    hx of   Family history of anesthesia complication    PONV   GERD (gastroesophageal reflux disease)    Ovarian cyst    PONV (postoperative nausea and vomiting)     SURGICAL HISTORY: Past Surgical History:  Procedure Laterality Date   BLADDER SUSPENSION  05/26/2012   Procedure: TRANSVAGINAL TAPE (TVT) PROCEDURE;  Surgeon: Loney Laurence, MD;  Location: [facility];  Service: Gynecology;  Laterality: N/A;   BREAST BIOPSY Left 01/08/2023   MM LT BREAST BX W LOC DEV 1ST LESION IMAGE BX SPEC STEREO GUIDE 01/08/2023 [facility]   CYSTOCELE REPAIR  05/26/2012   Procedure: ANTERIOR REPAIR (CYSTOCELE);  Surgeon: Loney Laurence, MD;  Location: [facility];  Service: Gynecology;  Laterality: N/A;   CYSTOSCOPY  05/26/2012   Procedure: CYSTOSCOPY;  Surgeon: Loney Laurence, MD;  Location: [facility];  Service: Gynecology;  Laterality: N/A;   LAPAROSCOPIC ASSISTED VAGINAL HYSTERECTOMY  05/26/2012   Procedure: LAPAROSCOPIC ASSISTED VAGINAL HYSTERECTOMY;  Surgeon: Miguel Aschoff, MD;  Location: [facility];  Service: Gynecology;  Laterality: N/A;   SALPINGOOPHORECTOMY  05/26/2012   Procedure: SALPINGO OOPHERECTOMY;  Surgeon: Miguel Aschoff, MD;  Location: [facility];  Service: Gynecology;  Laterality: Left;   TONSILLECTOMY AND ADENOIDECTOMY     WISDOM TOOTH EXTRACTION      SOCIAL HISTORY: Social History   Socioeconomic History   Marital status: Married    Spouse name: Not on file   Number of children: 2   Years of education: Not on file   Highest education level: Not on file  Occupational History   Occupation: Magazine features editor: [employer]  Tobacco Use   Smoking status: Former    Passive exposure: Never    Smokeless tobacco: Never  Building services engineer Use: Never used  Substance and Sexual Activity   Alcohol use: No   Drug use: No   Sexual activity: Yes    Partners: Male    Birth control/protection: Surgical    Comment: LAVH 05/2012  Other Topics Concern   Not on file  Social History Narrative   Regular exercise-yes   Husband has had a vasectomy   Social Determinants of Health   Financial Resource Strain: Not on file  Food Insecurity: Not on file  Transportation Needs: Not on file  Physical Activity: Not on file  Stress: Not on file  Social Connections: Not on file  Intimate Partner Violence: Not on file    FAMILY HISTORY: Family History  Problem Relation Age of Onset   Heart disease Mother    Heart attack Mother 66   Diabetes Mother    Hypertension Mother    Lupus Mother    Lupus Maternal Aunt    Lupus Maternal Aunt    Rheum arthritis Maternal Grandmother    Hypertension Maternal Grandfather    Heart disease Maternal Grandfather    Healthy Daughter    Polycystic ovary syndrome Daughter    Healthy Daughter    Diabetes Other    Hypertension Other    Breast cancer Neg Hx     ALLERGIES:  is allergic to clarithromycin, hycodan [hydrocodone bit-homatrop mbr], and penicillins.  MEDICATIONS:  Current Outpatient Medications  Medication Sig Dispense Refill   albuterol (PROAIR HFA) 108 (90 Base) MCG/ACT inhaler TAKE 2 PUFFS BY MOUTH EVERY 6 HOURS AS NEEDED FOR WHEEZE OR SHORTNESS OF BREATH 25.5 each 3   aspirin 81 MG EC tablet Take 1 tablet (81 mg total) by mouth daily. Swallow whole. 30 tablet 12   atorvastatin (LIPITOR) 40 MG tablet TAKE 1 TABLET BY MOUTH EVERY DAY 90 tablet 3   budesonide-formoterol (SYMBICORT) 160-4.5 MCG/ACT inhaler Inhale 2 puffs into the lungs in the morning and at bedtime. 10.2 g 11   cetirizine (ZYRTEC) 10 MG tablet Take 10 mg by mouth daily.     Diethylpropion HCl CR 75 MG TB24 Take by mouth. (Patient not taking: Reported on 03/31/2023)      doxycycline (VIBRA-TABS) 100 MG tablet Take 1 tablet (100 mg total) by mouth 2 (two) times daily. 20 tablet 0   erythromycin ophthalmic ointment Place 1 Application into the left eye 3 (three) times daily for 10 days. 3.5 g 0   hydrochlorothiazide (MICROZIDE) 12.5 MG capsule Take 1 capsule (12.5 mg total) by mouth daily. 90 capsule 3   losartan (COZAAR) 50 MG tablet TAKE 1 TABLET BY MOUTH EVERY DAY 90 tablet 3   montelukast (SINGULAIR) 10 MG tablet TAKE 1 TABLET BY MOUTH EVERYDAY AT BEDTIME 90 tablet 3   Multiple Vitamin (MULTIVITAMIN) tablet Take 1 tablet by mouth daily.     Omega-3 Fatty Acids (FISH OIL PO) Take  by mouth daily.     pantoprazole (PROTONIX) 40 MG tablet Take 1 tablet (40 mg total) by mouth daily. 30 tablet 11   Probiotic Product (PROBIOTIC PO) Take by mouth daily.     promethazine-dextromethorphan (PROMETHAZINE-DM) 6.25-15 MG/5ML syrup Take 5 mLs by mouth 4 (four) times daily as needed for cough. 118 mL 0   rizatriptan (MAXALT-MLT) 10 MG disintegrating tablet Take 1 tablet (10 mg total) by mouth as needed for migraine. May repeat in 2 hours if needed 10 tablet 11   No current facility-administered medications for this visit.    REVIEW OF SYSTEMS:   Constitutional: ( - ) fevers, ( - )  chills , ( - ) night sweats Eyes: ( - ) blurriness of vision, ( - ) double vision, ( - ) watery eyes Ears, nose, mouth, throat, and face: ( - ) mucositis, ( - ) sore throat Respiratory: ( - ) cough, ( - ) dyspnea, ( - ) wheezes Cardiovascular: ( - ) palpitation, ( - ) chest discomfort, ( - ) lower extremity swelling Gastrointestinal:  ( - ) nausea, ( - ) heartburn, ( - ) change in bowel habits Skin: ( - ) abnormal skin rashes Lymphatics: ( - ) new lymphadenopathy, ( - ) easy bruising Neurological: ( - ) numbness, ( - ) tingling, ( - ) new weaknesses Behavioral/Psych: ( - ) mood change, ( - ) new changes  All other systems were reviewed with the patient and are negative.  PHYSICAL  EXAMINATION:  Vitals:   05/21/23 0918  BP: 124/87  Pulse: 90  Resp: 18  Temp: 97.9 F (36.6 C)  SpO2: 97%   Filed Weights   05/21/23 0918  Weight: 256 lb 6.4 oz (116.3 kg)    GENERAL: well appearing middle-aged Caucasian female in NAD  SKIN: skin color, texture, turgor are normal, no rashes or significant lesions EYES: conjunctiva are pink and non-injected, sclera clear LUNGS: clear to auscultation and percussion with normal breathing effort HEART: regular rate & rhythm and no murmurs and no lower extremity edema Musculoskeletal: no cyanosis of digits and no clubbing  PSYCH: alert & oriented x 3, fluent speech NEURO: no focal motor/sensory deficits  LABORATORY DATA:  I have reviewed the data as listed    Latest Ref Rng & Units 03/31/2023   10:01 AM 12/24/2022    1:35 PM 09/11/2022    4:18 PM  CBC  WBC 3.8 - 10.8 Thousand/uL 5.5  6.2  8.4   Hemoglobin 11.7 - 15.5 g/dL 16.1  09.6  04.5   Hematocrit 35.0 - 45.0 % 42.3  41.9  41.8   Platelets 140 - 400 Thousand/uL 243  240.0  233.0        Latest Ref Rng & Units 03/31/2023   10:01 AM 09/11/2022    4:18 PM 03/09/2022    4:17 PM  CMP  Glucose 65 - 99 mg/dL 97  95  88   BUN 7 - 25 mg/dL 14  23  17    Creatinine 0.50 - 0.99 mg/dL 4.09  8.11  9.14   Sodium 135 - 146 mmol/L 138  138  137   Potassium 3.5 - 5.3 mmol/L 4.1  4.0  4.1   Chloride 98 - 110 mmol/L 104  103  103   CO2 20 - 32 mmol/L 28  27  26    Calcium 8.6 - 10.2 mg/dL 9.5  9.6  9.4   Total Protein 6.1 - 8.1 g/dL 6.7  7.1  6.9  Total Bilirubin 0.2 - 1.2 mg/dL 0.6  0.5  0.5   Alkaline Phos 39 - 117 U/L  96  95   AST 10 - 35 U/L 18  15  16    ALT 6 - 29 U/L 19  14  14       ASSESSMENT & PLAN Jayda White Ayers 47 y.o. female with medical history significant for a lower extremity VTE in the setting of estrogen therapy and known history of Factor V Leiden.  After review of the labs, review of the records, and discussion with the patient the patients findings  are most consistent with a provoked blood clot in the setting of factor V Leiden.  # History of Factor V Leiden # Lower Extremity VTE Provoked in Setting of Estrogen Based Birth Control -- Findings at this time appear most consistent with a provoked lower extremity VTE in the setting of estrogen-based birth control. -- Patient has 2 daughters 1 of whom have been tested for factor V Leiden.  Would recommend the other daughter be tested as well. -- No clear indication for repeat testing at this time. -- Patient does not require anticoagulation therapy at this time.  Her prior VTE was provoked. -- Recommend avoidance of estrogen based therapies in the future including HRT -- Recommend strategies for preventing VTE during prolonged travel/surgery.  Recommend that she inform her surgeons of her coagulopathy.  If needed we can see her back for recommendations regarding perioperative management. -- No need for routine follow-up in our clinic but provided patient with contact information in the event she were to need guidance regarding VTE prevention in the setting of her history of factor V Leiden.   No orders of the defined types were placed in this encounter.   All questions were answered. The patient knows to call the clinic with any problems, questions or concerns.  A total of more than 45 minutes were spent on this encounter with face-to-face time and non-face-to-face time, including preparing to see the patient, ordering tests and/or medications, counseling the patient and coordination of care as outlined above.   Ulysees Barns, MD Department of Hematology/Oncology [facility] Phone: [phone] Pager: [phone] Email: [email or web address]  06/01/2023 1:38 PM

## 2023-06-05 ENCOUNTER — Ambulatory Visit
Admission: RE | Admit: 2023-06-05 | Discharge: 2023-06-05 | Disposition: A | Discharge status: Home / Self Care | Payer: BC Managed Care – PPO | Source: Ambulatory Visit | Attending: Urology | Admitting: Urology

## 2023-06-05 DIAGNOSIS — D3000 Benign neoplasm of unspecified kidney: Secondary | ICD-10-CM

## 2023-06-05 DIAGNOSIS — D4102 Neoplasm of uncertain behavior of left kidney: Secondary | ICD-10-CM

## 2023-06-05 MED ORDER — GADOPICLENOL 0.5 MMOL/ML IV SOLN
10.0000 mL | Freq: Once | INTRAVENOUS | Status: AC | PRN
  Administered 2023-06-05: 10 mL via INTRAVENOUS

## 2023-06-09 ENCOUNTER — Encounter: Payer: Self-pay | Admitting: Internal Medicine

## 2023-06-09 ENCOUNTER — Ambulatory Visit: Payer: BC Managed Care – PPO | Admitting: Internal Medicine

## 2023-06-09 VITALS — BP 100/52 | HR 66 | Temp 97.8°F | Resp 14 | Ht 69.0 in | Wt 257.0 lb

## 2023-06-09 DIAGNOSIS — K222 Esophageal obstruction: Secondary | ICD-10-CM

## 2023-06-09 DIAGNOSIS — R935 Abnormal findings on diagnostic imaging of other abdominal regions, including retroperitoneum: Secondary | ICD-10-CM

## 2023-06-09 DIAGNOSIS — R1310 Dysphagia, unspecified: Secondary | ICD-10-CM

## 2023-06-09 DIAGNOSIS — K219 Gastro-esophageal reflux disease without esophagitis: Secondary | ICD-10-CM

## 2023-06-09 MED ORDER — SODIUM CHLORIDE 0.9 % IV SOLN
500.0000 mL | INTRAVENOUS | Status: DC
Start: 2023-06-09 — End: 2023-06-09

## 2023-06-09 NOTE — Progress Notes (Signed)
Called to room to assist during endoscopic procedure.  Patient ID and intended procedure confirmed with present staff. Received instructions for my participation in the procedure from the performing physician.  

## 2023-06-09 NOTE — Op Note (Signed)
[facility] Patient Name: Robin Ayers Procedure Date: 06/09/2023 9:04 AM MRN: [id number] Endoscopist: Wilhemina Bonito. Marina Goodell , MD, [id number] Age: 47 Referring MD:  Date of Birth: [birth year] Gender: Female Account #: [id number] Procedure:                Upper GI endoscopy with balloon dilation of the                            esophagus. 20 mm max Indications:              Dysphagia, Esophageal reflux, Abnormal CT of the GI                            tract. Feeling much better on pantoprazole daily Medicines:                Monitored Anesthesia Care Procedure:                Pre-Anesthesia Assessment:                           - Prior to the procedure, a History and Physical                            was performed, and patient medications and                            allergies were reviewed. The patient's tolerance of                            previous anesthesia was also reviewed. The risks                            and benefits of the procedure and the sedation                            options and risks were discussed with the patient.                            All questions were answered, and informed consent                            was obtained. Prior Anticoagulants: The patient has                            taken no anticoagulant or antiplatelet agents.                            After reviewing the risks and benefits, the patient                            was deemed in satisfactory condition to undergo the                            procedure.  After obtaining informed consent, the endoscope was                            passed under direct vision. Throughout the                            procedure, the patient's blood pressure, pulse, and                            oxygen saturations were monitored continuously. The                            Olympus Scope [id number] was introduced through the                            mouth,  and advanced to the second part of duodenum.                            The upper GI endoscopy was accomplished without                            difficulty. The patient tolerated the procedure                            well. Scope In: Scope Out: Findings:                 The esophagus revealed esophagitis at the                            gastroesophageal junction as manifested by several                            small erosions. No Barrett's.                           One benign-appearing, intrinsic moderate stenosis                            was found 35 cm from the incisors. This stenosis                            measured 1.5 cm (inner diameter). The stenosis was                            traversed. A TTS dilator was passed through the                            scope. Dilation with an 18-19-20 mm balloon dilator                            was performed to 20 mm. No significant resistance.                            No heme  The stomach was normal, save small hiatal hernia.                           The examined duodenum was normal.                           The cardia and gastric fundus were normal on                            retroflexion. Complications:            No immediate complications. Estimated Blood Loss:     Estimated blood loss: none. Impression:               1. GERD with esophagitis and peptic stricture                            status post esophageal injection.                           2. Small lateral hernia.                           EGD Recommendation:           1. Patient has a contact number available for                            emergencies. The signs and symptoms of potential                            delayed complications were discussed with the                            patient. Return to normal activities tomorrow.                            Written discharge instructions were provided to the                             patient.                           2. Post dilation diet.                           3. Continue present medications.                           4. Continue pantoprazole 40 mg DAILY                           5. Office follow-up with Dr. Marina Goodell in 3 months Wilhemina Bonito. Marina Goodell, MD 06/09/2023 9:22:02 AM This report has been signed electronically.

## 2023-06-09 NOTE — Patient Instructions (Addendum)
POST DILATION DIET Continue present medications Office visit with Dr Marina Goodell in 3 months, 09/28/23 arrive at 11:00 AM, 3rd Floor  Handouts/information given for Hiatal Hernia, GERD, dilation diet, dilation and esophagitis.  YOU HAD AN ENDOSCOPIC PROCEDURE TODAY AT THE [facility]:   Refer to the procedure report that was given to you for any specific questions about what was found during the examination.  If the procedure report does not answer your questions, please call your gastroenterologist to clarify.  If you requested that your care partner not be given the details of your procedure findings, then the procedure report has been included in a sealed envelope for you to review at your convenience later.  YOU SHOULD EXPECT: Some feelings of bloating in the abdomen. Passage of more gas than usual.  Walking can help get rid of the air that was put into your GI tract during the procedure and reduce the bloating. If you had a lower endoscopy (such as a colonoscopy or flexible sigmoidoscopy) you may notice spotting of blood in your stool or on the toilet paper. If you underwent a bowel prep for your procedure, you may not have a normal bowel movement for a few days.  Please Note:  You might notice some irritation and congestion in your nose or some drainage.  This is from the oxygen used during your procedure.  There is no need for concern and it should clear up in a day or so.  SYMPTOMS TO REPORT IMMEDIATELY:  Following upper endoscopy (EGD)  Vomiting of blood or coffee ground material  New chest pain or pain under the shoulder blades  Painful or persistently difficult swallowing  New shortness of breath  Fever of 100F or higher  Black, tarry-looking stools  For urgent or emergent issues, a gastroenterologist can be reached at any hour by calling [phone]. Do not use MyChart messaging for urgent concerns.    DIET:  SEE POST DILATION DIET HANDOUT, CLEAR LIQUIDS UNTIL 1030  am, THEN SOFT DIET FOR 24 HOURS,  but then you may proceed to your regular diet.  Drink plenty of fluids but you should avoid alcoholic beverages for 24 hours.  ACTIVITY:  You should plan to take it easy for the rest of today and you should NOT DRIVE or use heavy machinery until tomorrow (because of the sedation medicines used during the test).    FOLLOW UP: Our staff will call the number listed on your records the next business day following your procedure.  We will call around 7:15- 8:00 am to check on you and address any questions or concerns that you may have regarding the information given to you following your procedure. If we do not reach you, we will leave a message.     SIGNATURES/CONFIDENTIALITY: You and/or your care partner have signed paperwork which will be entered into your electronic medical record.  These signatures attest to the fact that that the information above on your After Visit Summary has been reviewed and is understood.  Full responsibility of the confidentiality of this discharge information lies with you and/or your care-partner.

## 2023-06-09 NOTE — Progress Notes (Signed)
Sedate, gd SR, tolerated procedure well, VSS, report to RN 

## 2023-06-09 NOTE — Progress Notes (Signed)
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 Alvester Morin regarding abnormal CT scan.   The patient underwent a noncontrast CT scan of the abdomen January 04, 2023.  This to follow-up on left renal lesion.  She was noted to have distal esophageal wall thickening and a small hiatal hernia as well as small paraesophageal lymph nodes.  She was then referred.  Patient tells me that she has been having significant reflux disease over the past several months for which she has been taking Pepcid regularly.  Symptoms are worse at night.  She is also been experiencing some intermittent solid food dysphagia.  Other complaints include bloating and gas.  Her weight fluctuates.  No GI bleeding.  No prior GI evaluations.  No family history of GI cancers.  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   Review of blood work from October 2023 shows normal comprehensive metabolic panel, normal lipid profile, normal CBC with hemoglobin 14.0, normal hemoglobin A1c of 5.8.   REVIEW OF SYSTEMS:   All non-GI ROS negative unless otherwise stated in the HPI except for sinus and allergies, lower extremity edema, excessive thirst       Past Medical History:  Diagnosis Date   ALLERGIC RHINITIS 10/26/2007   ASTHMA 04/10/2010   Blood dyscrasia      factor V Leiden    Clotting disorder     DYSLIPIDEMIA 06/24/2009   Factor V Leiden mutation      hx of   Family history of anesthesia complication      PONV   Ovarian cyst     PONV (postoperative nausea and vomiting)             Past Surgical History:  Procedure Laterality Date   ABDOMINAL HYSTERECTOMY       BLADDER SUSPENSION   05/26/2012    Procedure: TRANSVAGINAL TAPE (TVT) PROCEDURE;  Surgeon: Loney Laurence, MD;  Location: [facility];  Service: Gynecology;  Laterality: N/A;   BREAST BIOPSY Left 01/08/2023    MM LT BREAST BX W LOC DEV 1ST LESION IMAGE BX SPEC  STEREO GUIDE 01/08/2023 [facility]   CYSTOCELE REPAIR   05/26/2012    Procedure: ANTERIOR REPAIR (CYSTOCELE);  Surgeon: Loney Laurence, MD;  Location: [facility];  Service: Gynecology;  Laterality: N/A;   CYSTOSCOPY   05/26/2012    Procedure: CYSTOSCOPY;  Surgeon: Loney Laurence, MD;  Location: [facility];  Service: Gynecology;  Laterality: N/A;   LAPAROSCOPIC ASSISTED VAGINAL HYSTERECTOMY   05/26/2012    Procedure: LAPAROSCOPIC ASSISTED VAGINAL HYSTERECTOMY;  Surgeon: Miguel Aschoff, MD;  Location: [facility];  Service: Gynecology;  Laterality: N/A;   SALPINGOOPHORECTOMY   05/26/2012    Procedure: SALPINGO OOPHERECTOMY;  Surgeon: Miguel Aschoff, MD;  Location: [facility];  Service: Gynecology;  Laterality: Left;   TONSILLECTOMY AND ADENOIDECTOMY       WISDOM TOOTH EXTRACTION          Social History Robin Ayers  reports that she has quit smoking. She has never used smokeless tobacco. She reports that she does not drink alcohol and does not use drugs.   family history includes Diabetes in her mother and another family member; Heart attack (age of onset: 24) in her mother; Heart disease in her maternal grandfather and mother; Hypertension in her maternal grandfather, mother, and another family member.  Allergies  Allergen Reactions   Clarithromycin        REACTION: GI upset   Hycodan [Hydrocodone Bit-Homatrop Mbr]        migraine   Penicillins        REACTION: rash          PHYSICAL EXAMINATION: Vital signs: BP 116/80 (BP Location: Left Arm, Patient Position: Sitting, Cuff Size: Large)   Pulse 90   Ht 5\' 8"  (1.727 m)   Wt 257 lb (116.6 kg)   LMP 04/16/2012   BMI 39.08 kg/m   Constitutional: generally well-appearing, no acute distress Psychiatric: alert and oriented x3, cooperative Eyes: extraocular movements intact, anicteric, conjunctiva pink Mouth: oral pharynx moist, no lesions Neck: supple no lymphadenopathy Cardiovascular: heart regular rate and rhythm, no  murmur Lungs: clear to auscultation bilaterally Abdomen: soft, nontender, nondistended, no obvious ascites, no peritoneal signs, normal bowel sounds, no organomegaly Rectal: Omitted Extremities: no clubbing, cyanosis, or lower extremity edema bilaterally Skin: no lesions on visible extremities Neuro: No focal deficits.  Cranial nerves intact   ASSESSMENT:   1.  Chronic GERD.  Significant symptoms despite Pepcid 2.  Intermittent solid food dysphagia.  Rule out peptic stricture.  Rule out neoplasm. 3.  Abnormal CT scan with esophageal thickening.  Suspect reflux esophagitis.  Needs evaluated. 4.  Obesity 5.  Colon cancer screening.  Average risk 6.  General medical problems.  Stable   PLAN:   1.  Reflux precautions with attention to weight loss 2.  Prescribe pantoprazole 40 mg daily.  Medication effects and risks reviewed. 3.  Schedule upper endoscopy with probable esophageal dilation.The nature of the procedure, as well as the risks, benefits, and alternatives were carefully and thoroughly reviewed with the patient. Ample time for discussion and questions allowed. The patient understood, was satisfied, and agreed to proceed. 4.  Office follow-up after the above 5.  Will discuss colon cancer screening strategies after esophageal issues have been adequately addressed   Recent H&P as above.  No interval change.  Now for upper endoscopy with probable esophageal dilation

## 2023-06-11 ENCOUNTER — Telehealth: Payer: Self-pay | Admitting: *Deleted

## 2023-06-11 NOTE — Telephone Encounter (Signed)
  Follow up Call-     06/09/2023    7:43 AM  Call back number  Post procedure Call Back phone  # [phone]  Permission to leave phone message Yes     Patient questions:  Do you have a fever, pain , or abdominal swelling? No. Pain Score  0 *  Have you tolerated food without any problems? Yes.    Have you been able to return to your normal activities? Yes.    Do you have any questions about your discharge instructions: Diet   No. Medications  No. Follow up visit  No.  Do you have questions or concerns about your Care? No.  Actions: * If pain score is 4 or above: No action needed, pain <4.

## 2023-08-30 ENCOUNTER — Encounter: Payer: Self-pay | Admitting: Family Medicine

## 2023-08-30 ENCOUNTER — Ambulatory Visit (INDEPENDENT_AMBULATORY_CARE_PROVIDER_SITE_OTHER): Payer: BC Managed Care – PPO | Admitting: Family Medicine

## 2023-08-30 VITALS — BP 122/80 | HR 78 | Temp 98.4°F | Resp 20 | Ht 69.0 in | Wt 263.0 lb

## 2023-08-30 DIAGNOSIS — Z23 Encounter for immunization: Secondary | ICD-10-CM

## 2023-08-30 DIAGNOSIS — M25562 Pain in left knee: Secondary | ICD-10-CM | POA: Diagnosis not present

## 2023-08-30 MED ORDER — MELOXICAM 15 MG PO TABS
15.0000 mg | ORAL_TABLET | Freq: Every day | ORAL | 0 refills | Status: DC
Start: 2023-08-30 — End: 2024-04-20

## 2023-08-30 NOTE — Progress Notes (Signed)
Assessment & Plan:  1. Acute pain of left knee Knee exercises provided for patient to complete at home.  Started meloxicam 15 mg once daily.  Advised against the use of other NSAIDs while taking meloxicam.  Advised she may continue to take Tylenol if needed.  Discussed she can continue applying lidocaine and/or Voltaren gel to her knee.  If symptoms have not improved within 6 weeks she should return for follow-up so that imaging can be ordered. - meloxicam (MOBIC) 15 MG tablet; Take 1 tablet (15 mg total) by mouth daily.  Dispense: 30 tablet; Refill: 0   Follow up plan: Return if symptoms worsen or fail to improve in the 6 weeks.  Robin Boston, MSN, APRN, FNP-C  Subjective:  HPI: Robin Ayers is a 47 y.o. female presenting on 08/30/2023 for Knee Pain (Left knee, climbing in a chair to hang something. No fall or KNOWN injury. Started 9/11./Some swelling )  Knee Pain: Patient presents with knee pain involving the  left knee. Onset of the symptoms was  almost two weeks ago and got a lot worse one week ago . Inciting event:  climbing into a chair when it made a weird noise . Current symptoms include pain located under the knee and laterally and swelling. Pain is aggravated by any weight bearing.  Patient has had no prior knee problems. Evaluation to date: none. Treatment to date: OTC analgesics which are not very effective and Lidocaine .   ROS: Negative unless specifically indicated above in HPI.   Relevant past medical history reviewed and updated as indicated.   Allergies and medications reviewed and updated.   Current Outpatient Medications:    albuterol (PROAIR HFA) 108 (90 Base) MCG/ACT inhaler, TAKE 2 PUFFS BY MOUTH EVERY 6 HOURS AS NEEDED FOR WHEEZE OR SHORTNESS OF BREATH, Disp: 25.5 each, Rfl: 3   atorvastatin (LIPITOR) 40 MG tablet, TAKE 1 TABLET BY MOUTH EVERY DAY, Disp: 90 tablet, Rfl: 3   budesonide-formoterol (SYMBICORT) 160-4.5 MCG/ACT inhaler, Inhale 2 puffs  into the lungs in the morning and at bedtime., Disp: 10.2 g, Rfl: 11   cetirizine (ZYRTEC) 10 MG tablet, Take 10 mg by mouth daily., Disp: , Rfl:    hydrochlorothiazide (MICROZIDE) 12.5 MG capsule, Take 1 capsule (12.5 mg total) by mouth daily., Disp: 90 capsule, Rfl: 3   losartan (COZAAR) 50 MG tablet, TAKE 1 TABLET BY MOUTH EVERY DAY, Disp: 90 tablet, Rfl: 3   montelukast (SINGULAIR) 10 MG tablet, TAKE 1 TABLET BY MOUTH EVERYDAY AT BEDTIME, Disp: 90 tablet, Rfl: 3   Multiple Vitamin (MULTIVITAMIN) tablet, Take 1 tablet by mouth daily., Disp: , Rfl:    Omega-3 Fatty Acids (FISH OIL PO), Take by mouth daily., Disp: , Rfl:    pantoprazole (PROTONIX) 40 MG tablet, Take 1 tablet (40 mg total) by mouth daily., Disp: 30 tablet, Rfl: 11   Probiotic Product (PROBIOTIC PO), Take by mouth daily., Disp: , Rfl:    rizatriptan (MAXALT-MLT) 10 MG disintegrating tablet, Take 1 tablet (10 mg total) by mouth as needed for migraine. May repeat in 2 hours if needed, Disp: 10 tablet, Rfl: 11  Allergies  Allergen Reactions   Clarithromycin     REACTION: GI upset   Hycodan [Hydrocodone Bit-Homatrop Mbr]     migraine   Penicillins     REACTION: rash    Objective:   BP 122/80   Pulse 78   Temp 98.4 F (36.9 C)   Resp 20   Ht 5\' 9"  (  1.753 m)   Wt 263 lb (119.3 kg)   LMP 04/16/2012   BMI 38.84 kg/m    Physical Exam Vitals reviewed.  Constitutional:      General: She is not in acute distress.    Appearance: Normal appearance. She is not ill-appearing, toxic-appearing or diaphoretic.  HENT:     Head: Normocephalic and atraumatic.  Eyes:     General: No scleral icterus.       Right eye: No discharge.        Left eye: No discharge.     Conjunctiva/sclera: Conjunctivae normal.  Cardiovascular:     Rate and Rhythm: Normal rate.  Pulmonary:     Effort: Pulmonary effort is normal. No respiratory distress.  Musculoskeletal:        General: Normal range of motion.     Cervical back: Normal range of  motion.     Left knee: Swelling present. No deformity, effusion, erythema, ecchymosis, lacerations, bony tenderness or crepitus. Tenderness present over the lateral joint line and patellar tendon. No medial joint line tenderness. No LCL laxity, MCL laxity, ACL laxity or PCL laxity.Normal alignment, normal meniscus and normal patellar mobility.     Instability Tests: Anterior drawer test negative. Posterior drawer test negative. Anterior Lachman test negative. Medial McMurray test negative and lateral McMurray test negative.  Skin:    General: Skin is warm and dry.     Capillary Refill: Capillary refill takes less than 2 seconds.  Neurological:     General: No focal deficit present.     Mental Status: She is alert and oriented to person, place, and time. Mental status is at baseline.  Psychiatric:        Mood and Affect: Mood normal.        Behavior: Behavior normal.        Thought Content: Thought content normal.        Judgment: Judgment normal.

## 2023-08-30 NOTE — Patient Instructions (Signed)
Voltaren gel 

## 2023-09-28 ENCOUNTER — Ambulatory Visit: Payer: BC Managed Care – PPO | Admitting: Internal Medicine

## 2023-12-31 ENCOUNTER — Encounter: Payer: Self-pay | Admitting: Internal Medicine

## 2023-12-31 ENCOUNTER — Ambulatory Visit (INDEPENDENT_AMBULATORY_CARE_PROVIDER_SITE_OTHER): Payer: 59 | Admitting: Internal Medicine

## 2023-12-31 VITALS — BP 120/86 | HR 83 | Ht 69.0 in | Wt 259.0 lb

## 2023-12-31 DIAGNOSIS — K219 Gastro-esophageal reflux disease without esophagitis: Secondary | ICD-10-CM | POA: Diagnosis not present

## 2023-12-31 DIAGNOSIS — Z1211 Encounter for screening for malignant neoplasm of colon: Secondary | ICD-10-CM

## 2023-12-31 DIAGNOSIS — R1310 Dysphagia, unspecified: Secondary | ICD-10-CM

## 2023-12-31 DIAGNOSIS — K222 Esophageal obstruction: Secondary | ICD-10-CM

## 2023-12-31 MED ORDER — PANTOPRAZOLE SODIUM 40 MG PO TBEC: 40.0000 mg | DELAYED_RELEASE_TABLET | Freq: Every day | ORAL | 3 refills | Status: AC

## 2023-12-31 NOTE — Patient Instructions (Signed)
We have sent the following medications to your pharmacy for you to pick up at your convenience:  Pantoprazole  Please follow up in one year.  _______________________________________________________  If your blood pressure at your visit was 140/90 or greater, please contact your primary care physician to follow up on this.  _______________________________________________________  If you are age 48 or older, your body mass index should be between 23-30. Your Body mass index is 38.25 kg/m. If this is out of the aforementioned range listed, please consider follow up with your Primary Care Provider.  If you are age 20 or younger, your body mass index should be between 19-25. Your Body mass index is 38.25 kg/m. If this is out of the aformentioned range listed, please consider follow up with your Primary Care Provider.   ________________________________________________________  The [facility] providers would like to encourage you to use [facility] to communicate with providers for non-urgent requests or questions.  Due to long hold times on the telephone, sending your provider a message by [facility] may be a faster and more efficient way to get a response.  Please allow 48 business hours for a response.  Please remember that this is for non-urgent requests.  _______________________________________________________

## 2023-12-31 NOTE — Progress Notes (Signed)
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  She was last seen in the office March 10, 2023 regarding chronic GERD, intermittent solid food dysphagia, abnormal CT scan with esophageal thickening.  See that dictation.  The patient subsequently underwent upper endoscopy on June 09, 2023.  She was found to have a distal esophageal stricture and esophagitis.  She was balloon dilated to a maximal diameter of 20 mm.  She was continued on pantoprazole.  She was asked to follow-up in 3 months, but follows up at this time.  She is pleased to report that she is doing well.  No reflux symptoms on pantoprazole.  Her dysphagia has resolved post dilation.  Tolerating medication well.  She has not had colon cancer screening.  No new GI complaints.  REVIEW OF SYSTEMS:  All non-GI ROS negative except for sinus allergy, ankle edema  Past Medical History:  Diagnosis Date   ALLERGIC RHINITIS 10/26/2007   ASTHMA 04/10/2010   Blood dyscrasia    factor V Leiden    Clotting disorder (HCC)    DYSLIPIDEMIA 06/24/2009   Factor V Leiden mutation (HCC)    hx of   Family history of anesthesia complication    PONV   GERD (gastroesophageal reflux disease)    Ovarian cyst    PONV (postoperative nausea and vomiting)     Past Surgical History:  Procedure Laterality Date   BLADDER SUSPENSION  05/26/2012   Procedure: TRANSVAGINAL TAPE (TVT) PROCEDURE;  Surgeon: Loney Laurence, MD;  Location: [facility];  Service: Gynecology;  Laterality: N/A;   BREAST BIOPSY Left 01/08/2023   MM LT BREAST BX W LOC DEV 1ST LESION IMAGE BX SPEC STEREO GUIDE 01/08/2023 [facility]   CYSTOCELE REPAIR  05/26/2012   Procedure: ANTERIOR REPAIR (CYSTOCELE);  Surgeon: Loney Laurence, MD;  Location: [facility];  Service: Gynecology;  Laterality: N/A;   CYSTOSCOPY  05/26/2012    Procedure: CYSTOSCOPY;  Surgeon: Loney Laurence, MD;  Location: [facility];  Service: Gynecology;  Laterality: N/A;   LAPAROSCOPIC ASSISTED VAGINAL HYSTERECTOMY  05/26/2012   Procedure: LAPAROSCOPIC ASSISTED VAGINAL HYSTERECTOMY;  Surgeon: Miguel Aschoff, MD;  Location: [facility];  Service: Gynecology;  Laterality: N/A;   SALPINGOOPHORECTOMY  05/26/2012   Procedure: SALPINGO OOPHERECTOMY;  Surgeon: Miguel Aschoff, MD;  Location: [facility];  Service: Gynecology;  Laterality: Left;   TONSILLECTOMY AND ADENOIDECTOMY     WISDOM TOOTH EXTRACTION      Social History Julieanna Geraci Murphy-Justice  reports that she has quit smoking. She has never been exposed to tobacco smoke. She has never used smokeless tobacco. She reports that she does not drink alcohol and does not use drugs.  family history includes Diabetes in her mother and another family member; Healthy in her daughter and daughter; Heart attack (age of onset: 52) in her mother; Heart disease in her maternal grandfather and mother; Hypertension in her maternal grandfather, mother, and another family member; Lupus in her maternal aunt, maternal aunt, and mother; Polycystic ovary syndrome in her daughter; Rheum arthritis in her maternal grandmother.  Allergies  Allergen Reactions   Clarithromycin     REACTION: GI upset   Hycodan [Hydrocodone Bit-Homatrop Mbr]     migraine   Penicillins     REACTION: rash       PHYSICAL EXAMINATION: Vital signs: BP 120/86   Pulse 83   Ht 5'  9" (1.753 m)   Wt 259 lb (117.5 kg)   LMP 04/16/2012   BMI 38.25 kg/m   Constitutional: generally well-appearing, no acute distress Psychiatric: alert and oriented x3, cooperative Eyes: extraocular movements intact, anicteric, conjunctiva pink Mouth: oral pharynx moist, no lesions Neck: supple no lymphadenopathy Cardiovascular: heart regular rate and rhythm, no murmur Lungs: clear to auscultation bilaterally Abdomen: soft, nontender, nondistended, no obvious ascites, no  peritoneal signs, normal bowel sounds, no organomegaly Rectal: Omitted Extremities: no clubbing, cyanosis, or lower extremity edema bilaterally Skin: no lesions on visible extremities Neuro: No focal deficits.  Cranial nerves intact  ASSESSMENT:  1.  GERD complicated by peptic stricture.  Asymptomatic post dilation on PPI 2.  Colon cancer screening.  Average risk for neoplasia 3.  General Medical problems.  Stable   PLAN:  1.  Reflux precautions 2.  Continue pantoprazole 40 mg daily 3.  Refill pantoprazole 40 mg daily.  Medication risk reviewed 4.  We discussed colon cancer screening strategies today including FIT testing, Cologuard testing, and optical colonoscopy.  She will consider her options and was encouraged to discuss this with her PCP. 5.  Routine GI follow-up 1 year.  Contact the office in the interim for any questions or problems Total time of 30 minutes was spent preparing to see the patient, obtaining interval history, performing medically appropriate physical exam, counseling and educating the patient regarding the above listed issues, ordering medication, defining follow-up interval, and documenting clinical information in the health record

## 2024-01-19 ENCOUNTER — Telehealth: Payer: Self-pay | Admitting: Internal Medicine

## 2024-01-19 ENCOUNTER — Other Ambulatory Visit: Payer: Self-pay | Admitting: Internal Medicine

## 2024-01-19 NOTE — Telephone Encounter (Signed)
Copied from CRM [id number]. Topic: Clinical - Prescription Issue >> Jan 19, 2024 11:52 AM Louie Boston wrote: Reason for CRM: Patient called in regarding losartan (COZAAR) 50 MG tablet. Patient was advised that the refill was denied. Per notes, patient requested medication refill too soon. Please contact patient for clarification on refill and advise if it will be filled today. Patient can be reached at [phone].

## 2024-01-20 ENCOUNTER — Other Ambulatory Visit: Payer: Self-pay

## 2024-01-20 MED ORDER — LOSARTAN POTASSIUM 50 MG PO TABS: 50.0000 mg | ORAL_TABLET | Freq: Every day | ORAL | 0 refills | Status: DC

## 2024-01-20 NOTE — Telephone Encounter (Signed)
Sent in 90 day supply 0 refills

## 2024-03-31 ENCOUNTER — Other Ambulatory Visit: Payer: Self-pay | Admitting: Rheumatology

## 2024-03-31 ENCOUNTER — Encounter: Payer: Self-pay | Admitting: Rheumatology

## 2024-03-31 DIAGNOSIS — R768 Other specified abnormal immunological findings in serum: Secondary | ICD-10-CM

## 2024-03-31 NOTE — Telephone Encounter (Signed)
 I have placed the lab orders.  Patient can come and get the labs.  Labs can be discussed at the follow-up visit.  It is difficult to make appointments with the rheumatologist.  She should try to establish with a rheumatologist in [place] area.  I do not know any particular rheumatologist in [place].  They have [facility] and [facility] in [place] area.

## 2024-04-03 ENCOUNTER — Other Ambulatory Visit: Payer: Self-pay | Admitting: *Deleted

## 2024-04-03 DIAGNOSIS — R768 Other specified abnormal immunological findings in serum: Secondary | ICD-10-CM

## 2024-04-05 ENCOUNTER — Other Ambulatory Visit: Payer: Self-pay | Admitting: Internal Medicine

## 2024-04-05 DIAGNOSIS — Z1231 Encounter for screening mammogram for malignant neoplasm of breast: Secondary | ICD-10-CM

## 2024-04-05 LAB — COMPREHENSIVE METABOLIC PANEL WITH GFR
AG Ratio: 1.9 (calc) (ref 1.0–2.5)
ALT: 14 U/L (ref 6–29)
AST: 22 U/L (ref 10–35)
Albumin: 4.5 g/dL (ref 3.6–5.1)
Alkaline phosphatase (APISO): 93 U/L (ref 31–125)
BUN: 17 mg/dL (ref 7–25)
CO2: 25 mmol/L (ref 20–32)
Calcium: 9.3 mg/dL (ref 8.6–10.2)
Chloride: 100 mmol/L (ref 98–110)
Creat: 0.86 mg/dL (ref 0.50–0.99)
Globulin: 2.4 g/dL (ref 1.9–3.7)
Glucose, Bld: 88 mg/dL (ref 65–99)
Potassium: 3.5 mmol/L (ref 3.5–5.3)
Sodium: 137 mmol/L (ref 135–146)
Total Bilirubin: 0.7 mg/dL (ref 0.2–1.2)
Total Protein: 6.9 g/dL (ref 6.1–8.1)
eGFR: 84 mL/min/{1.73_m2} (ref >=60)

## 2024-04-05 LAB — CBC WITH DIFFERENTIAL/PLATELET
Absolute Lymphocytes: 1024 {cells}/uL (ref 850–3900)
Absolute Monocytes: 520 {cells}/uL (ref 200–950)
Basophils Absolute: 40 {cells}/uL (ref 0–200)
Basophils Relative: 0.5 %
Eosinophils Absolute: 120 {cells}/uL (ref 15–500)
Eosinophils Relative: 1.5 %
HCT: 41.6 % (ref 35.0–45.0)
Hemoglobin: 14.1 g/dL (ref 11.7–15.5)
MCH: 30 pg (ref 27.0–33.0)
MCHC: 33.9 g/dL (ref 32.0–36.0)
MCV: 88.5 fL (ref 80.0–100.0)
MPV: 9.9 fL (ref 7.5–12.5)
Monocytes Relative: 6.5 %
Neutro Abs: 6296 {cells}/uL (ref 1500–7800)
Neutrophils Relative %: 78.7 %
Platelets: 240 10*3/uL (ref 140–400)
RBC: 4.7 10*6/uL (ref 3.80–5.10)
RDW: 12.9 % (ref 11.0–15.0)
Total Lymphocyte: 12.8 %
WBC: 8 10*3/uL (ref 3.8–10.8)

## 2024-04-05 LAB — ANA: Anti Nuclear Antibody (ANA): POSITIVE — AB

## 2024-04-05 LAB — ANTI-DNA ANTIBODY, DOUBLE-STRANDED: ds DNA Ab: <1 [IU]/mL

## 2024-04-05 LAB — ANTI-NUCLEAR AB-TITER (ANA TITER): ANA Titer 1: 1:160 {titer} — ABNORMAL HIGH

## 2024-04-05 LAB — PROTEIN / CREATININE RATIO, URINE
Creatinine, Urine: 37 mg/dL (ref 20–275)
Total Protein, Urine: <4 mg/dL — ABNORMAL LOW (ref 5–24)

## 2024-04-05 LAB — SJOGRENS SYNDROME-A EXTRACTABLE NUCLEAR ANTIBODY: SSA (Ro) (ENA) Antibody, IgG: <1 AI

## 2024-04-05 LAB — SEDIMENTATION RATE: Sed Rate: 14 mm/h (ref 0–20)

## 2024-04-05 LAB — EXTRA SPECIMEN

## 2024-04-05 LAB — C3 AND C4
C3 Complement: 178 mg/dL (ref 83–193)
C4 Complement: 40 mg/dL (ref 15–57)

## 2024-04-05 LAB — SJOGRENS SYNDROME-B EXTRACTABLE NUCLEAR ANTIBODY: SSB (La) (ENA) Antibody, IgG: 3.6 AI — AB

## 2024-04-05 NOTE — Progress Notes (Signed)
 I will discuss results at the follow-up visit.

## 2024-04-07 ENCOUNTER — Ambulatory Visit
Admission: RE | Admit: 2024-04-07 | Discharge: 2024-04-07 | Disposition: A | Discharge status: Home / Self Care | Source: Ambulatory Visit | Attending: Internal Medicine | Admitting: Internal Medicine

## 2024-04-07 DIAGNOSIS — Z1231 Encounter for screening mammogram for malignant neoplasm of breast: Secondary | ICD-10-CM

## 2024-04-12 ENCOUNTER — Other Ambulatory Visit: Payer: Self-pay | Admitting: Internal Medicine

## 2024-04-12 DIAGNOSIS — R928 Other abnormal and inconclusive findings on diagnostic imaging of breast: Secondary | ICD-10-CM

## 2024-04-13 NOTE — Progress Notes (Signed)
 Office Visit Note  Patient: Robin Ayers             Date of Birth: Feb 17, 1976           MRN: [id number]             PCP: Roslyn Coombe, MD Referring: Roslyn Coombe, MD Visit Date: 04/20/2024 Occupation: @GUAROCC @  Subjective:  Increased pain  History of Present Illness: Robin Ayers is a 48 y.o. female with positive ANA and positive SSB antibody.  She returns today after her last visit in May 2024.  She states over the last year she has noticed increased pain and discomfort all over.  She describes pain in her both hands, both knees, ankles and her feet.  She states she notices swelling in her hands.  She is having difficulty wearing her rings.  She is also noticed increased redness in her face and with intermittent rashes on her extremities.  She continues to have fatigue, dry mouth and dry eyes.  She also complains of oral ulcers.  Denies Raynaud's phenomenon but she has noticed some tingling in her hands.  She continues to have some sensitivity.  She states she has muscle pain and has difficulty holding objects for a long time.  She states she has difficulty laying down in 1 position.  She has insomnia due to nocturnal pain.    Activities of Daily Living:  Patient reports morning stiffness for 1 hour.   Patient Reports nocturnal pain.  Difficulty dressing/grooming: Denies Difficulty climbing stairs: Reports Difficulty getting out of chair: Reports Difficulty using hands for taps, buttons, cutlery, and/or writing: Reports  Review of Systems  Constitutional:  Positive for fatigue.  HENT:  Positive for mouth sores and mouth dryness.   Eyes:  Positive for dryness.  Respiratory:  Positive for shortness of breath.   Cardiovascular:  Negative for chest pain and palpitations.  Gastrointestinal:  Positive for diarrhea. Negative for blood in stool and constipation.  Endocrine: Positive for increased urination.  Genitourinary:  Negative for involuntary  urination.  Musculoskeletal:  Positive for joint pain, gait problem, joint pain, joint swelling, myalgias, muscle weakness, morning stiffness, muscle tenderness and myalgias.  Skin:  Positive for rash and sensitivity to sunlight. Negative for color change and hair loss.  Allergic/Immunologic: Positive for susceptible to infections.  Neurological:  Positive for dizziness and headaches.  Hematological:  Positive for swollen glands.  Psychiatric/Behavioral:  Positive for sleep disturbance. Negative for depressed mood. The patient is nervous/anxious.     PMFS History:  Patient Active Problem List   Diagnosis Date Noted   Acute upper respiratory infection 05/26/2023   Left conjunctivitis 05/26/2023   Venous insufficiency 04/26/2023   Polyarthralgia 09/11/2022   Obesity 04/16/2022   HTN (hypertension) 03/13/2022   Migraine 03/15/2021   Vitamin D  deficiency 03/08/2020   Upper respiratory infection 01/06/2018   Cough 12/30/2017   Wheezing 12/30/2017   Rash and nonspecific skin eruption 05/27/2016   Sinusitis 10/11/2013   Factor V Leiden mutation [facility])    Encounter for well adult exam with abnormal findings 07/06/2011   Asthma 04/10/2010   Tenosynovitis of foot and ankle 10/11/2009   Hyperlipidemia 06/24/2009   HAND PAIN, BILATERAL 06/24/2009   FATIGUE 06/24/2009   URINARY INCONTINENCE 06/24/2009   Allergic rhinitis 10/26/2007    Past Medical History:  Diagnosis Date   ALLERGIC RHINITIS 10/26/2007   ASTHMA 04/10/2010   Blood dyscrasia    factor V Leiden    Clotting disorder (  HCC)    DYSLIPIDEMIA 06/24/2009   Factor V Leiden mutation (HCC)    hx of   Family history of anesthesia complication    PONV   GERD (gastroesophageal reflux disease)    Ovarian cyst    PONV (postoperative nausea and vomiting)     Family History  Problem Relation Age of Onset   Heart disease Mother    Heart attack Mother 27   Diabetes Mother    Hypertension Mother    Lupus Mother    Lupus Maternal  Aunt    Lupus Maternal Aunt    Rheum arthritis Maternal Grandmother    Hypertension Maternal Grandfather    Heart disease Maternal Grandfather    Healthy Daughter    Polycystic ovary syndrome Daughter    Healthy Daughter    Diabetes Other    Hypertension Other    Breast cancer Neg Hx    Past Surgical History:  Procedure Laterality Date   BLADDER SUSPENSION  05/26/2012   Procedure: TRANSVAGINAL TAPE (TVT) PROCEDURE;  Surgeon: Oddis Bench, MD;  Location: [facility];  Service: Gynecology;  Laterality: N/A;   BREAST BIOPSY Left 01/08/2023   MM LT BREAST BX W LOC DEV 1ST LESION IMAGE BX SPEC STEREO GUIDE 01/08/2023 [facility]   CYSTOCELE REPAIR  05/26/2012   Procedure: ANTERIOR REPAIR (CYSTOCELE);  Surgeon: Oddis Bench, MD;  Location: [facility];  Service: Gynecology;  Laterality: N/A;   CYSTOSCOPY  05/26/2012   Procedure: CYSTOSCOPY;  Surgeon: Oddis Bench, MD;  Location: [facility];  Service: Gynecology;  Laterality: N/A;   LAPAROSCOPIC ASSISTED VAGINAL HYSTERECTOMY  05/26/2012   Procedure: LAPAROSCOPIC ASSISTED VAGINAL HYSTERECTOMY;  Surgeon: Deann Exon, MD;  Location: [facility];  Service: Gynecology;  Laterality: N/A;   SALPINGOOPHORECTOMY  05/26/2012   Procedure: SALPINGO OOPHERECTOMY;  Surgeon: Deann Exon, MD;  Location: [facility];  Service: Gynecology;  Laterality: Left;   TONSILLECTOMY AND ADENOIDECTOMY     WISDOM TOOTH EXTRACTION     Social History   Social History Narrative   Regular exercise-yes   Husband has had a vasectomy   Immunization History  Administered Date(s) Administered   Influenza Split 09/09/2012   Influenza Whole 08/21/2010, 09/07/2019   Influenza, Seasonal, Injecte, Preservative Fre 08/30/2023   Influenza,inj,Quad PF,6+ Mos 09/02/2016, 10/12/2018   Influenza-Unspecified 09/07/2015   PFIZER(Purple Top)SARS-COV-2 Vaccination 05/24/2020, 06/20/2020   Tdap 10/04/2017     Objective: Vital Signs: BP 124/81 (BP Location: Left Arm, Patient  Position: Sitting, Cuff Size: Normal)   Pulse 86   Resp 14   Ht 5\' 9"  (1.753 m)   Wt 258 lb (117 kg)   LMP 04/16/2012   BMI 38.10 kg/m    Physical Exam Vitals and nursing note reviewed.  Constitutional:      Appearance: She is well-developed.  HENT:     Head: Normocephalic and atraumatic.  Eyes:     Conjunctiva/sclera: Conjunctivae normal.  Cardiovascular:     Rate and Rhythm: Normal rate and regular rhythm.     Heart sounds: Normal heart sounds.  Pulmonary:     Effort: Pulmonary effort is normal.     Breath sounds: Normal breath sounds.  Abdominal:     General: Bowel sounds are normal.     Palpations: Abdomen is soft.  Musculoskeletal:     Cervical back: Normal range of motion.  Lymphadenopathy:     Cervical: No cervical adenopathy.  Skin:    General: Skin is warm and dry.     Capillary Refill:  Capillary refill takes less than 2 seconds.     Comments: Facial erythema was noted.  Redness was noted on her neck.  Neurological:     Mental Status: She is alert and oriented to person, place, and time.  Psychiatric:        Behavior: Behavior normal.      Musculoskeletal Exam: Cervical, thoracic and lumbar spine good range of motion.  Shoulder joints, elbow joints, wrist joints, MCPs PIPs and DIPs with good range of motion with no synovitis.  Hip joints and knee joints in good range of motion without any warmth swelling or effusion.  There was no tenderness over ankles or MTPs.  She had generalized hyperalgesia and positive tender points.  CDAI Exam: CDAI Score: -- Patient Global: --; Provider Global: -- Swollen: --; Tender: -- Joint Exam 04/20/2024   No joint exam has been documented for this visit   There is currently no information documented on the homunculus. Go to the Rheumatology activity and complete the homunculus joint exam.  Investigation: No additional findings.  Imaging: MM 3D SCREENING MAMMOGRAM BILATERAL BREAST Result Date: 04/11/2024 CLINICAL DATA:   Screening. EXAM: DIGITAL SCREENING BILATERAL MAMMOGRAM WITH TOMOSYNTHESIS AND CAD TECHNIQUE: Bilateral screening digital craniocaudal and mediolateral oblique mammograms were obtained. Bilateral screening digital breast tomosynthesis was performed. The images were evaluated with computer-aided detection. COMPARISON:  Previous exam(s). ACR Breast Density Category b: There are scattered areas of fibroglandular density. FINDINGS: In the right breast, a possible asymmetry warrants further evaluation. In the left breast, no findings suspicious for malignancy. IMPRESSION: Further evaluation is suggested for possible asymmetry in the right breast. RECOMMENDATION: Diagnostic mammogram and possibly ultrasound of the right breast. (Code:[id number]) The patient will be contacted regarding the findings, and additional imaging will be scheduled. BI-RADS CATEGORY  0: Incomplete: Need additional imaging evaluation. \ Electronically Signed   By: Anna Barnes M.D.   On: 04/11/2024 15:56    Recent Labs: Lab Results  Component Value Date   WBC 8.0 04/03/2024   HGB 14.1 04/03/2024   PLT 240 04/03/2024   NA 137 04/03/2024   K 3.5 04/03/2024   CL 100 04/03/2024   CO2 25 04/03/2024   GLUCOSE 88 04/03/2024   BUN 17 04/03/2024   CREATININE 0.86 04/03/2024   BILITOT 0.7 04/03/2024   ALKPHOS 96 09/11/2022   AST 22 04/03/2024   ALT 14 04/03/2024   PROT 6.9 04/03/2024   ALBUMIN 4.5 09/11/2022   CALCIUM  9.3 04/03/2024   GFRAA >90 05/12/2012    Speciality Comments: No specialty comments available.  Procedures:  No procedures performed Allergies: Clarithromycin, Hycodan [hydrocodone  bit-homatrop mbr], and Penicillins   Assessment / Plan:     Visit Diagnoses: Undifferentiated connective tissue disease (HCC) - ANA 1: 160NS, SSB Ab positive.  History of fatigue, arthralgias, hair loss, dry eyes and photosensitivity.  Patient returns today after last visit 1 year ago.  Patient states her symptoms are getting worse with  increased fatigue, arthralgias, hair loss, dry mouth, oral ulcers,  dry eyes, intermittent joint swelling, photosensitivity.  She gives history of swelling in her hands.  No synovitis was noted on the examination.  Most recent labs showed positive ANA and positive SS B antibodies.  Complements were normal and the sed rate was normal.  Urine protein creatinine ratio was normal.  Labs were discussed with the patient at length.  I gave her the option of giving a trial of hydroxychloroquine.  She wants to proceed with hydroxychloroquine.  She is moving to  [place].  I advised her to establish with a rheumatologist when she moves there.  A handout on hydroxychloroquine was given after side effects were discussed.  Consent was obtained.  Will send a prescription for hydroxychloroquine 200 mg p.o. twice daily.  High risk medication use-she was advised to get labs in a month, 3 months and then every 5 months.  Baseline and then annual examination to monitor for ocular toxicity was advised.  Information reimmunization was placed in the AVS.  Primary osteoarthritis of both hands -she complains of discomfort in the bilateral hands with intermittent swelling.  She states her rings are tight to wear.  No synovitis was noted on the examination.  X-rays obtained in the past and clinical findings are consistent with early osteoarthritis.  Chronic pain of right knee -he complains of discomfort in both knees.  No warmth swelling or effusion was noted.  X-rays in the past were unremarkable.  Primary osteoarthritis of both feet -she complains of the ongoing discomfort in her ankles and her feet.  No synovitis was noted.  X-rays obtained in the past and clinical findings are consistent with osteoarthritis.  Fibromyalgia-she had generalized hyperalgesia, positive tender points.  She also gives history of generalized muscle aches.  Detail concerning fibromyalgia syndrome was provided.  Need for regular exercise including water   aerobics, swimming and stretching were discussed.  Patient states she exercises on a regular basis.  She may benefit from use of Cymbalta.  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  Good sleep hygiene was discussed.  Other fatigue - History of fatigue for many years.  Family history of systemic lupus erythematosus-mother, maternal aunts  Primary hypertension-blood pressure was normal at 124/81.  The medical problems listed as follows:  Factor V Leiden mutation (HCC)  Mixed hyperlipidemia  History of DVT (deep vein thrombosis) - She had DVT as a teenager while she was on oral contraceptives.  Anticardiolipin antibodies, beta-2  GP 1 antibodies, lupus anticoagulant were all negative.  Mild intermittent asthma, unspecified whether complicated  History of gastroesophageal reflux (GERD)  Seasonal allergic rhinitis due to other allergic trigger  Orders: No orders of the defined types were placed in this encounter.  Meds ordered this encounter  Medications   hydroxychloroquine (PLAQUENIL) 200 MG tablet    Sig: Take 1 tablet (200 mg total) by mouth 2 (two) times daily.    Dispense:  60 tablet    Refill:  2     Follow-Up Instructions: Return in about 3 months (around 07/21/2024) for UCTD.   Nicholas Bari, MD  Note - This record has been created using Animal nutritionist.  Chart creation errors have been sought, but may not always  have been located. Such creation errors do not reflect on  the standard of medical care.

## 2024-04-15 ENCOUNTER — Other Ambulatory Visit: Payer: Self-pay | Admitting: Internal Medicine

## 2024-04-17 ENCOUNTER — Other Ambulatory Visit: Payer: Self-pay

## 2024-04-20 ENCOUNTER — Encounter: Payer: Self-pay | Admitting: Rheumatology

## 2024-04-20 ENCOUNTER — Ambulatory Visit: Payer: BC Managed Care – PPO | Attending: Rheumatology | Admitting: Rheumatology

## 2024-04-20 VITALS — BP 124/81 | HR 86 | Resp 14 | Ht 69.0 in | Wt 258.0 lb

## 2024-04-20 DIAGNOSIS — M25561 Pain in right knee: Secondary | ICD-10-CM

## 2024-04-20 DIAGNOSIS — M19042 Primary osteoarthritis, left hand: Secondary | ICD-10-CM

## 2024-04-20 DIAGNOSIS — M19072 Primary osteoarthritis, left ankle and foot: Secondary | ICD-10-CM

## 2024-04-20 DIAGNOSIS — J3089 Other allergic rhinitis: Secondary | ICD-10-CM

## 2024-04-20 DIAGNOSIS — G8929 Other chronic pain: Secondary | ICD-10-CM

## 2024-04-20 DIAGNOSIS — M19041 Primary osteoarthritis, right hand: Secondary | ICD-10-CM | POA: Diagnosis not present

## 2024-04-20 DIAGNOSIS — R5383 Other fatigue: Secondary | ICD-10-CM

## 2024-04-20 DIAGNOSIS — D6851 Activated protein C resistance: Secondary | ICD-10-CM

## 2024-04-20 DIAGNOSIS — M797 Fibromyalgia: Secondary | ICD-10-CM

## 2024-04-20 DIAGNOSIS — Z79899 Other long term (current) drug therapy: Secondary | ICD-10-CM

## 2024-04-20 DIAGNOSIS — E782 Mixed hyperlipidemia: Secondary | ICD-10-CM

## 2024-04-20 DIAGNOSIS — Z86718 Personal history of other venous thrombosis and embolism: Secondary | ICD-10-CM

## 2024-04-20 DIAGNOSIS — M19071 Primary osteoarthritis, right ankle and foot: Secondary | ICD-10-CM

## 2024-04-20 DIAGNOSIS — M359 Systemic involvement of connective tissue, unspecified: Secondary | ICD-10-CM | POA: Diagnosis not present

## 2024-04-20 DIAGNOSIS — I10 Essential (primary) hypertension: Secondary | ICD-10-CM

## 2024-04-20 DIAGNOSIS — R768 Other specified abnormal immunological findings in serum: Secondary | ICD-10-CM

## 2024-04-20 DIAGNOSIS — G4709 Other insomnia: Secondary | ICD-10-CM

## 2024-04-20 DIAGNOSIS — Z8269 Family history of other diseases of the musculoskeletal system and connective tissue: Secondary | ICD-10-CM

## 2024-04-20 DIAGNOSIS — J4520 Mild intermittent asthma, uncomplicated: Secondary | ICD-10-CM

## 2024-04-20 DIAGNOSIS — Z8719 Personal history of other diseases of the digestive system: Secondary | ICD-10-CM

## 2024-04-20 MED ORDER — HYDROXYCHLOROQUINE SULFATE 200 MG PO TABS: 200.0000 mg | ORAL_TABLET | Freq: Two times a day (BID) | ORAL | 2 refills | Status: DC

## 2024-04-20 NOTE — Patient Instructions (Signed)
 Hydroxychloroquine Tablets What is this medication? HYDROXYCHLOROQUINE (hye drox ee KLOR oh kwin) treats autoimmune conditions, such as rheumatoid arthritis and lupus. It works by slowing down an overactive immune system. It may also be used to prevent and treat malaria. It works by killing the parasite that causes malaria. It belongs to a group of medications called DMARDs. This medicine may be used for other purposes; ask your health care provider or pharmacist if you have questions. COMMON BRAND NAME(S): Plaquenil, Quineprox, SOVUNA What should I tell my care team before I take this medication? They need to know if you have any of these conditions: Diabetes Eye disease, vision problems Frequently drink alcohol G6PD deficiency Heart disease Irregular heartbeat or rhythm Kidney disease Liver disease Porphyria Psoriasis An unusual or allergic reaction to hydroxychloroquine, other medications, foods, dyes, or preservatives Pregnant or trying to get pregnant Breastfeeding How should I use this medication? Take this medication by mouth with water . Take it as directed on the prescription label. Do not cut, crush, or chew this medication. Swallow the tablets whole. Take it with food. Do not take it more than directed. Take all of this medication unless your care team tells you to stop it early. Keep taking it even if you think you are better. Take products with antacids in them at a different time of day than this medication. Take this medication 4 hours before or 4 hours after antacids. Talk to your care team if you have questions. Talk to your care team about the use of this medication in children. While this medication may be prescribed for selected conditions, precautions do apply. Overdosage: If you think you have taken too much of this medicine contact a poison control center or emergency room at once. NOTE: This medicine is only for you. Do not share this medicine with others. What if I  miss a dose? If you miss a dose, take it as soon as you can. If it is almost time for your next dose, take only that dose. Do not take double or extra doses. What may interact with this medication? Do not take this medication with any of the following: Cisapride Dronedarone Pimozide Thioridazine This medication may also interact with the following: Ampicillin Antacids Cimetidine Cyclosporine Digoxin Kaolin Medications for diabetes, such as insulin, glipizide, glyburide Medications for seizures, such as carbamazepine, phenobarbital, phenytoin Mefloquine Methotrexate Other medications that cause heart rhythm changes Praziquantel This list may not describe all possible interactions. Give your health care provider a list of all the medicines, herbs, non-prescription drugs, or dietary supplements you use. Also tell them if you smoke, drink alcohol, or use illegal drugs. Some items may interact with your medicine. What should I watch for while using this medication? Visit your care team for regular checks on your progress. Tell your care team if your symptoms do not start to get better or if they get worse. You may need blood work done while you are taking this medication. If you take other medications that can affect heart rhythm, you may need more testing. Talk to your care team if you have questions. Your vision may be tested before and during use of this medication. Tell your care team right away if you have any change in your eyesight. This medication may cause serious skin reactions. They can happen weeks to months after starting the medication. Contact your care team right away if you notice fevers or flu-like symptoms with a rash. The rash may be red or purple and then  turn into blisters or peeling of the skin. Or, you might notice a red rash with swelling of the face, lips or lymph nodes in your neck or under your arms. If you or your family notice any changes in your behavior, such as  new or worsening depression, thoughts of harming yourself, anxiety, or other unusual or disturbing thoughts, or memory loss, call your care team right away. What side effects may I notice from receiving this medication? Side effects that you should report to your care team as soon as possible: Allergic reactions--skin rash, itching, hives, swelling of the face, lips, tongue, or throat Aplastic anemia--unusual weakness or fatigue, dizziness, headache, trouble breathing, increased bleeding or bruising Change in vision Heart rhythm changes--fast or irregular heartbeat, dizziness, feeling faint or lightheaded, chest pain, trouble breathing Infection--fever, chills, cough, or sore throat Low blood sugar (hypoglycemia)--tremors or shaking, anxiety, sweating, cold or clammy skin, confusion, dizziness, rapid heartbeat Muscle injury--unusual weakness or fatigue, muscle pain, dark yellow or brown urine, decrease in amount of urine Pain, tingling, or numbness in the hands or feet Rash, fever, and swollen lymph nodes Redness, blistering, peeling, or loosening of the skin, including inside the mouth Thoughts of suicide or self-harm, worsening mood, or feelings of depression Unusual bruising or bleeding Side effects that usually do not require medical attention (report to your care team if they continue or are bothersome): Diarrhea Headache Nausea Stomach pain Vomiting This list may not describe all possible side effects. Call your doctor for medical advice about side effects. You may report side effects to FDA at 1-800-FDA-1088. Where should I keep my medication? Keep out of the reach of children and pets. Store at room temperature up to 30 degrees C (86 degrees F). Protect from light. Get rid of any unused medication after the expiration date. To get rid of medications that are no longer needed or have expired: Take the medication to a medication take-back program. Check with your pharmacy or law  enforcement to find a location. If you cannot return the medication, check the label or package insert to see if the medication should be thrown out in the garbage or flushed down the toilet. If you are not sure, ask your care team. If it is safe to put it in the trash, empty the medication out of the container. Mix the medication with cat litter, dirt, coffee grounds, or other unwanted substance. Seal the mixture in a bag or container. Put it in the trash. NOTE: This sheet is a summary. It may not cover all possible information. If you have questions about this medicine, talk to your doctor, pharmacist, or health care provider.  2024 Elsevier/Gold Standard (2022-06-01 00:00:00) Standing Labs We placed an order today for your standing lab work.   Please have your standing labs drawn in  1 month, 3 months and then every 5 months  Please have your labs drawn 2 weeks prior to your appointment so that the provider can discuss your lab results at your appointment, if possible.  Please note that you may see your imaging and lab results in MyChart before we have reviewed them. We will contact you once all results are reviewed. Please allow our office up to 72 hours to thoroughly review all of the results before contacting the office for clarification of your results.  WALK-IN LAB HOURS  Monday through Thursday from 8:00 am -12:30 pm and 1:00 pm-4:00 pm and Friday from 8:00 am-12:00 pm.  Patients with office visits requiring labs will  be seen before walk-in labs.  You may encounter longer than normal wait times. Please allow additional time. Wait times may be shorter on  Monday and Thursday afternoons.  We do not book appointments for walk-in labs. We appreciate your patience and understanding with our staff.   Labs are drawn by [facility]. Please bring your co-pay at the time of your lab draw.  You may receive a bill from [facility] for your lab work.  Please note if you are on Hydroxychloroquine and and an  order has been placed for a Hydroxychloroquine level,  you will need to have it drawn 4 hours or more after your last dose.  If you wish to have your labs drawn at another location, please call the office 24 hours in advance so we can fax the orders.  The office is located at [address], [place], [place] [address]   If you have any questions regarding directions or hours of operation,  please call [phone].   As a reminder, please drink plenty of water  prior to coming for your lab work. Thanks!  Vaccines You are taking a medication(s) that can suppress your immune system.  The following immunizations are recommended: Flu annually Covid-19  Td/Tdap (tetanus, diphtheria, pertussis) every 10 years Pneumonia (Prevnar 15 then Pneumovax 23 at least 1 year apart.  Alternatively, can take Prevnar 20 without needing additional dose) Shingrix: 2 doses from 4 weeks to 6 months apart  Please check with your PCP to make sure you are up to date.

## 2024-04-22 ENCOUNTER — Other Ambulatory Visit: Payer: Self-pay | Admitting: Internal Medicine

## 2024-04-24 ENCOUNTER — Telehealth: Payer: Self-pay | Admitting: Internal Medicine

## 2024-04-24 DIAGNOSIS — E559 Vitamin D deficiency, unspecified: Secondary | ICD-10-CM

## 2024-04-24 DIAGNOSIS — I10 Essential (primary) hypertension: Secondary | ICD-10-CM

## 2024-04-24 DIAGNOSIS — E538 Deficiency of other specified B group vitamins: Secondary | ICD-10-CM

## 2024-04-24 DIAGNOSIS — R739 Hyperglycemia, unspecified: Secondary | ICD-10-CM

## 2024-04-24 NOTE — Telephone Encounter (Signed)
 Copied from CRM [id number]. Topic: Appointments - Scheduling Inquiry for Clinic >> Apr 24, 2024 11:26 AM Robin Ayers wrote: Reason for CRM: Pt state pcp typically order labs a few days before annual physical- pt request for labs , I do not see an order but pt can be contacted to schedule for labs at [phone] , pt physical is schedule for 05/30.  ---  Please add labs for pt

## 2024-04-24 NOTE — Telephone Encounter (Signed)
 Ok labs are ordered

## 2024-04-25 NOTE — Telephone Encounter (Signed)
 Called and left voice mail

## 2024-04-26 ENCOUNTER — Ambulatory Visit (INDEPENDENT_AMBULATORY_CARE_PROVIDER_SITE_OTHER)

## 2024-04-26 DIAGNOSIS — Z23 Encounter for immunization: Secondary | ICD-10-CM | POA: Diagnosis not present

## 2024-04-26 NOTE — Progress Notes (Signed)
 After obtaining consent, and per orders of Dr. Autry Legions, injection of Shingle and Prevnar 20 given by Bretta Camp. Patient instructed to  report any adverse reaction to me immediately.

## 2024-05-02 ENCOUNTER — Other Ambulatory Visit (INDEPENDENT_AMBULATORY_CARE_PROVIDER_SITE_OTHER)

## 2024-05-02 DIAGNOSIS — E559 Vitamin D deficiency, unspecified: Secondary | ICD-10-CM | POA: Diagnosis not present

## 2024-05-02 DIAGNOSIS — E538 Deficiency of other specified B group vitamins: Secondary | ICD-10-CM | POA: Diagnosis not present

## 2024-05-02 DIAGNOSIS — R739 Hyperglycemia, unspecified: Secondary | ICD-10-CM

## 2024-05-02 DIAGNOSIS — I10 Essential (primary) hypertension: Secondary | ICD-10-CM

## 2024-05-02 LAB — BASIC METABOLIC PANEL WITH GFR
BUN: 15 mg/dL (ref 6–23)
CO2: 27 meq/L (ref 19–32)
Calcium: 8.9 mg/dL (ref 8.4–10.5)
Chloride: 104 meq/L (ref 96–112)
Creatinine, Ser: 0.8 mg/dL (ref 0.40–1.20)
GFR: 87.58 mL/min (ref >60.00)
Glucose, Bld: 89 mg/dL (ref 70–99)
Potassium: 3.8 meq/L (ref 3.5–5.1)
Sodium: 138 meq/L (ref 135–145)

## 2024-05-02 LAB — HEPATIC FUNCTION PANEL
ALT: 12 U/L (ref 0–35)
AST: 12 U/L (ref 0–37)
Albumin: 4.2 g/dL (ref 3.5–5.2)
Alkaline Phosphatase: 87 U/L (ref 39–117)
Bilirubin, Direct: 0.1 mg/dL (ref 0.0–0.3)
Total Bilirubin: 0.5 mg/dL (ref 0.2–1.2)
Total Protein: 6.4 g/dL (ref 6.0–8.3)

## 2024-05-02 LAB — LIPID PANEL
Cholesterol: 161 mg/dL (ref 0–200)
HDL: 40.3 mg/dL (ref >39.00)
LDL Cholesterol: 83 mg/dL (ref 0–99)
NonHDL: 120.72
Total CHOL/HDL Ratio: 4
Triglycerides: 188 mg/dL — ABNORMAL HIGH (ref 0.0–149.0)
VLDL: 37.6 mg/dL (ref 0.0–40.0)

## 2024-05-02 LAB — URINALYSIS, ROUTINE W REFLEX MICROSCOPIC
Bilirubin Urine: NEGATIVE
Ketones, ur: NEGATIVE
Leukocytes,Ua: NEGATIVE
Nitrite: NEGATIVE
Specific Gravity, Urine: 1.02 (ref 1.000–1.030)
Total Protein, Urine: NEGATIVE
Urine Glucose: NEGATIVE
Urobilinogen, UA: 1 (ref 0.0–1.0)
WBC, UA: NONE SEEN (ref 0–?)
pH: 6 (ref 5.0–8.0)

## 2024-05-02 LAB — TSH: TSH: 1.27 u[IU]/mL (ref 0.35–5.50)

## 2024-05-02 LAB — CBC WITH DIFFERENTIAL/PLATELET
Basophils Absolute: 0 10*3/uL (ref 0.0–0.1)
Basophils Relative: 0.7 % (ref 0.0–3.0)
Eosinophils Absolute: 0.1 10*3/uL (ref 0.0–0.7)
Eosinophils Relative: 2 % (ref 0.0–5.0)
HCT: 37.9 % (ref 36.0–46.0)
Hemoglobin: 12.9 g/dL (ref 12.0–15.0)
Lymphocytes Relative: 14.1 % (ref 12.0–46.0)
Lymphs Abs: 0.9 10*3/uL (ref 0.7–4.0)
MCHC: 34.1 g/dL (ref 30.0–36.0)
MCV: 86.2 fl (ref 78.0–100.0)
Monocytes Absolute: 0.4 10*3/uL (ref 0.1–1.0)
Monocytes Relative: 6.3 % (ref 3.0–12.0)
Neutro Abs: 4.7 10*3/uL (ref 1.4–7.7)
Neutrophils Relative %: 76.9 % (ref 43.0–77.0)
Platelets: 237 10*3/uL (ref 150.0–400.0)
RBC: 4.39 Mil/uL (ref 3.87–5.11)
RDW: 13.4 % (ref 11.5–15.5)
WBC: 6.2 10*3/uL (ref 4.0–10.5)

## 2024-05-02 LAB — VITAMIN B12: Vitamin B-12: 395 pg/mL (ref 211–911)

## 2024-05-02 LAB — VITAMIN D 25 HYDROXY (VIT D DEFICIENCY, FRACTURES): VITD: 36.65 ng/mL (ref 30.00–100.00)

## 2024-05-02 LAB — HEMOGLOBIN A1C: Hgb A1c MFr Bld: 5.8 % (ref 4.6–6.5)

## 2024-05-05 ENCOUNTER — Ambulatory Visit (INDEPENDENT_AMBULATORY_CARE_PROVIDER_SITE_OTHER): Admitting: Internal Medicine

## 2024-05-05 ENCOUNTER — Encounter: Payer: Self-pay | Admitting: Internal Medicine

## 2024-05-05 VITALS — BP 124/80 | HR 84 | Temp 97.9°F | Ht 69.0 in | Wt 262.0 lb

## 2024-05-05 DIAGNOSIS — D2370 Other benign neoplasm of skin of unspecified lower limb, including hip: Secondary | ICD-10-CM | POA: Insufficient documentation

## 2024-05-05 DIAGNOSIS — Z0000 Encounter for general adult medical examination without abnormal findings: Secondary | ICD-10-CM | POA: Diagnosis not present

## 2024-05-05 DIAGNOSIS — Z0001 Encounter for general adult medical examination with abnormal findings: Secondary | ICD-10-CM

## 2024-05-05 DIAGNOSIS — Z1211 Encounter for screening for malignant neoplasm of colon: Secondary | ICD-10-CM

## 2024-05-05 DIAGNOSIS — Z5320 Procedure and treatment not carried out because of patient's decision for unspecified reasons: Secondary | ICD-10-CM

## 2024-05-05 DIAGNOSIS — E559 Vitamin D deficiency, unspecified: Secondary | ICD-10-CM

## 2024-05-05 DIAGNOSIS — D225 Melanocytic nevi of trunk: Secondary | ICD-10-CM | POA: Insufficient documentation

## 2024-05-05 DIAGNOSIS — I10 Essential (primary) hypertension: Secondary | ICD-10-CM

## 2024-05-05 DIAGNOSIS — E782 Mixed hyperlipidemia: Secondary | ICD-10-CM | POA: Diagnosis not present

## 2024-05-05 DIAGNOSIS — D1800 Hemangioma unspecified site: Secondary | ICD-10-CM | POA: Insufficient documentation

## 2024-05-05 DIAGNOSIS — J4520 Mild intermittent asthma, uncomplicated: Secondary | ICD-10-CM | POA: Diagnosis not present

## 2024-05-05 DIAGNOSIS — L814 Other melanin hyperpigmentation: Secondary | ICD-10-CM | POA: Insufficient documentation

## 2024-05-05 NOTE — Progress Notes (Signed)
 Patient ID: Robin Ayers, female   DOB: 1976-10-10, 48 y.o.   MRN: [id number]         Chief Complaint:: wellness exam and low vit d, htn, hld, asthma       HPI:  Robin Ayers is a 48 y.o. female here for wellness exam; for hep c screen, declines colonoscopy but for cologuard, o/w up tod ate                        Also has seen rheum recently, to start hydroxychloroquine  soon after eye exam prior to start.  Pt denies chest pain, increased sob or doe, wheezing, orthopnea, PND, increased LE swelling, palpitations, dizziness or syncope.   Pt denies polydipsia, polyuria, or new focal neuro s/s.    Pt denies fever, wt loss, night sweats, loss of appetite, or other constitutional symptoms  Moving to new home in [place] soon.     Wt Readings from Last 3 Encounters:  05/05/24 262 lb (118.8 kg)  04/20/24 258 lb (117 kg)  12/31/23 259 lb (117.5 kg)   BP Readings from Last 3 Encounters:  05/05/24 124/80  04/20/24 124/81  12/31/23 120/86   Immunization History  Administered Date(s) Administered   Fluzone Influenza virus vaccine,trivalent (IIV3), split virus 10/18/2019   Influenza Split 09/09/2012   Influenza Whole 08/21/2010, 09/07/2019   Influenza, Seasonal, Injecte, Preservative Fre 08/30/2023   Influenza,inj,Quad PF,6+ Mos 09/02/2016, 10/12/2018   Influenza-Unspecified 09/07/2015   PFIZER(Purple Top)SARS-COV-2 Vaccination 05/24/2020, 06/20/2020   PNEUMOCOCCAL CONJUGATE-20 04/26/2024   Tdap 10/04/2017   Zoster Recombinant(Shingrix ) 04/26/2024   Health Maintenance Due  Topic Date Due   Hepatitis C Screening  Never done   Colonoscopy  Never done      Past Medical History:  Diagnosis Date   ALLERGIC RHINITIS 10/26/2007   ASTHMA 04/10/2010   Blood dyscrasia    factor V Leiden    Clotting disorder (HCC)    DYSLIPIDEMIA 06/24/2009   Factor V Leiden mutation (HCC)    hx of   Family history of anesthesia complication    PONV   GERD (gastroesophageal  reflux disease)    Ovarian cyst    PONV (postoperative nausea and vomiting)    Past Surgical History:  Procedure Laterality Date   BLADDER SUSPENSION  05/26/2012   Procedure: TRANSVAGINAL TAPE (TVT) PROCEDURE;  Surgeon: Oddis Bench, MD;  Location: [facility];  Service: Gynecology;  Laterality: N/A;   BREAST BIOPSY Left 01/08/2023   MM LT BREAST BX W LOC DEV 1ST LESION IMAGE BX SPEC STEREO GUIDE 01/08/2023 [facility]   CYSTOCELE REPAIR  05/26/2012   Procedure: ANTERIOR REPAIR (CYSTOCELE);  Surgeon: Oddis Bench, MD;  Location: [facility];  Service: Gynecology;  Laterality: N/A;   CYSTOSCOPY  05/26/2012   Procedure: CYSTOSCOPY;  Surgeon: Oddis Bench, MD;  Location: [facility];  Service: Gynecology;  Laterality: N/A;   LAPAROSCOPIC ASSISTED VAGINAL HYSTERECTOMY  05/26/2012   Procedure: LAPAROSCOPIC ASSISTED VAGINAL HYSTERECTOMY;  Surgeon: Deann Exon, MD;  Location: [facility];  Service: Gynecology;  Laterality: N/A;   SALPINGOOPHORECTOMY  05/26/2012   Procedure: SALPINGO OOPHERECTOMY;  Surgeon: Deann Exon, MD;  Location: [facility];  Service: Gynecology;  Laterality: Left;   TONSILLECTOMY AND ADENOIDECTOMY     WISDOM TOOTH EXTRACTION      reports that she has quit smoking. She has never been exposed to tobacco smoke. She has never used smokeless tobacco. She reports that she does not drink alcohol and  does not use drugs. family history includes Diabetes in her mother and another family member; Healthy in her daughter and daughter; Heart attack (age of onset: 46) in her mother; Heart disease in her maternal grandfather and mother; Hypertension in her maternal grandfather, mother, and another family member; Lupus in her maternal aunt, maternal aunt, and mother; Polycystic ovary syndrome in her daughter; Rheum arthritis in her maternal grandmother. Allergies  Allergen Reactions   Clarithromycin     REACTION: GI upset   Hycodan [Hydrocodone  Bit-Homatrop Mbr]     migraine   Penicillins      REACTION: rash   Current Outpatient Medications on File Prior to Visit  Medication Sig Dispense Refill   albuterol  (PROAIR  HFA) 108 (90 Base) MCG/ACT inhaler TAKE 2 PUFFS BY MOUTH EVERY 6 HOURS AS NEEDED FOR WHEEZE OR SHORTNESS OF BREATH 25.5 each 3   atorvastatin  (LIPITOR) 40 MG tablet TAKE 1 TABLET BY MOUTH EVERY DAY 90 tablet 3   budesonide -formoterol  (SYMBICORT ) 160-4.5 MCG/ACT inhaler Inhale 2 puffs into the lungs in the morning and at bedtime. 10.2 g 11   cetirizine (ZYRTEC) 10 MG tablet Take 10 mg by mouth daily.     hydroxychloroquine  (PLAQUENIL ) 200 MG tablet Take 1 tablet (200 mg total) by mouth 2 (two) times daily. 60 tablet 2   losartan  (COZAAR ) 50 MG tablet TAKE 1 TABLET BY MOUTH EVERY DAY 90 tablet 0   montelukast  (SINGULAIR ) 10 MG tablet TAKE 1 TABLET BY MOUTH EVERYDAY AT BEDTIME 90 tablet 3   Omega-3 Fatty Acids (FISH OIL PO) Take by mouth daily.     pantoprazole  (PROTONIX ) 40 MG tablet Take 1 tablet (40 mg total) by mouth daily. 90 tablet 3   Probiotic Product (PROBIOTIC PO) Take by mouth daily.     rizatriptan  (MAXALT -MLT) 10 MG disintegrating tablet Take 1 tablet (10 mg total) by mouth as needed for migraine. May repeat in 2 hours if needed 10 tablet 11   hydrochlorothiazide  (MICROZIDE ) 12.5 MG capsule TAKE 1 CAPSULE BY MOUTH EVERY DAY (Patient not taking: Reported on 05/05/2024) 90 capsule 3   No current facility-administered medications on file prior to visit.        ROS:  All others reviewed and negative.  Objective        PE:  BP 124/80 (BP Location: Right Arm, Patient Position: Sitting, Cuff Size: Normal)   Pulse 84   Temp 97.9 F (36.6 C) (Oral)   Ht 5\' 9"  (1.753 m)   Wt 262 lb (118.8 kg)   LMP 04/16/2012   SpO2 100%   BMI 38.69 kg/m                 Constitutional: Pt appears in NAD               HENT: Head: NCAT.                Right Ear: External ear normal.                 Left Ear: External ear normal.                Eyes: . Pupils are equal, round,  and reactive to light. Conjunctivae and EOM are normal               Nose: without d/c or deformity               Neck: Neck supple. Gross normal ROM  Cardiovascular: Normal rate and regular rhythm.                 Pulmonary/Chest: Effort normal and breath sounds without rales or wheezing.                Abd:  Soft, NT, ND, + BS, no organomegaly               Neurological: Pt is alert. At baseline orientation, motor grossly intact               Skin: Skin is warm. No rashes, no other new lesions, LE edema - none               Psychiatric: Pt behavior is normal without agitation   Micro: none  Cardiac tracings I have personally interpreted today:  none  Pertinent Radiological findings (summarize): none   Lab Results  Component Value Date   WBC 6.2 05/02/2024   HGB 12.9 05/02/2024   HCT 37.9 05/02/2024   PLT 237.0 05/02/2024   GLUCOSE 89 05/02/2024   CHOL 161 05/02/2024   TRIG 188.0 (H) 05/02/2024   HDL 40.30 05/02/2024   LDLDIRECT 130.0 03/04/2020   LDLCALC 83 05/02/2024   ALT 12 05/02/2024   AST 12 05/02/2024   NA 138 05/02/2024   K 3.8 05/02/2024   CL 104 05/02/2024   CREATININE 0.80 05/02/2024   BUN 15 05/02/2024   CO2 27 05/02/2024   TSH 1.27 05/02/2024   INR 0.87 05/12/2012   HGBA1C 5.8 05/02/2024   Assessment/Plan:  Robin Ayers is a 48 y.o. White or Caucasian [1] female with  has a past medical history of ALLERGIC RHINITIS (10/26/2007), ASTHMA (04/10/2010), Blood dyscrasia, Clotting disorder (HCC), DYSLIPIDEMIA (06/24/2009), Factor V Leiden mutation (HCC), Family history of anesthesia complication, GERD (gastroesophageal reflux disease), Ovarian cyst, and PONV (postoperative nausea and vomiting).  Hyperlipidemia Lab Results  Component Value Date   LDLCALC 83 05/02/2024   Stable, pt to continue current statin lipitor 40 mg qd   Asthma Stable, cont inhaler prn  Vitamin D  deficiency Last vitamin D  Lab Results  Component Value Date    VD25OH 36.65 05/02/2024   Low, to start oral replacement   HTN (hypertension) BP Readings from Last 3 Encounters:  05/05/24 124/80  04/20/24 124/81  12/31/23 120/86   Stable, pt to continue medical treatment losartan  50 mg every day, hct 12.5 mg qd   Encounter for well adult exam with abnormal findings Age and sex appropriate education and counseling updated with regular exercise and diet Referrals for preventative services - for cologuard, and hep c screen Immunizations addressed - none needed Smoking counseling  - none needed Evidence for depression or other mood disorder - none significant Most recent labs reviewed. I have personally reviewed and have noted: 1) the patient's medical and social history 2) The patient's current medications and supplements 3) The patient's height, weight, and BMI have been recorded in the chart  Followup: Return if symptoms worsen or fail to improve.  Rosalia Colonel, MD 05/06/2024 8:28 PM [facility]

## 2024-05-05 NOTE — Patient Instructions (Signed)
 Please continue all other medications as before, and refills have been done if requested.  Please have the pharmacy call with any other refills you may need.  Please continue your efforts at being more active, low cholesterol diet, and weight control.  You are otherwise up to date with prevention measures today.  Please keep your appointments with your specialists as you may have planned  You will be contacted regarding the referral for: Cologuard to your home  Good Luck with the mover!

## 2024-05-06 ENCOUNTER — Encounter: Payer: Self-pay | Admitting: Internal Medicine

## 2024-05-06 NOTE — Assessment & Plan Note (Signed)
 BP Readings from Last 3 Encounters:  05/05/24 124/80  04/20/24 124/81  12/31/23 120/86   Stable, pt to continue medical treatment losartan  50 mg every day, hct 12.5 mg qd

## 2024-05-06 NOTE — Assessment & Plan Note (Signed)
 Stable , cont inhaler prn

## 2024-05-06 NOTE — Assessment & Plan Note (Signed)
 Last vitamin D  Lab Results  Component Value Date   VD25OH 36.65 05/02/2024   Low, to start oral replacement

## 2024-05-06 NOTE — Assessment & Plan Note (Signed)
 Age and sex appropriate education and counseling updated with regular exercise and diet Referrals for preventative services - for cologuard, and hep c screen Immunizations addressed - none needed Smoking counseling  - none needed Evidence for depression or other mood disorder - none significant Most recent labs reviewed. I have personally reviewed and have noted: 1) the patient's medical and social history 2) The patient's current medications and supplements 3) The patient's height, weight, and BMI have been recorded in the chart

## 2024-05-06 NOTE — Assessment & Plan Note (Signed)
 Lab Results  Component Value Date   LDLCALC 83 05/02/2024   Stable, pt to continue current statin lipitor 40 mg qd

## 2024-05-18 ENCOUNTER — Ambulatory Visit

## 2024-05-18 ENCOUNTER — Ambulatory Visit
Admission: RE | Admit: 2024-05-18 | Discharge: 2024-05-18 | Disposition: A | Discharge status: Home / Self Care | Source: Ambulatory Visit | Attending: Internal Medicine | Admitting: Internal Medicine

## 2024-05-18 DIAGNOSIS — R928 Other abnormal and inconclusive findings on diagnostic imaging of breast: Secondary | ICD-10-CM

## 2024-05-19 LAB — COLOGUARD: COLOGUARD: NEGATIVE

## 2024-05-20 ENCOUNTER — Ambulatory Visit: Payer: Self-pay | Admitting: Internal Medicine

## 2024-05-22 ENCOUNTER — Encounter: Payer: Self-pay | Admitting: Internal Medicine

## 2024-05-23 ENCOUNTER — Other Ambulatory Visit: Payer: Self-pay

## 2024-05-23 MED ORDER — ALBUTEROL SULFATE HFA 108 (90 BASE) MCG/ACT IN AERS
INHALATION_SPRAY | RESPIRATORY_TRACT | 3 refills | Status: AC
Start: 2024-05-23 — End: ?

## 2024-06-29 NOTE — Progress Notes (Deleted)
 Office Visit Note  Patient: Robin Ayers             Date of Birth: 1976/02/04           MRN: [id number]             PCP: Norleen Lynwood ORN, MD Referring: Norleen Lynwood ORN, MD Visit Date: 07/13/2024 Occupation: @GUAROCC @  Subjective:  No chief complaint on file.   History of Present Illness: Robin Ayers is a 48 y.o. female ***     Activities of Daily Living:  Patient reports morning stiffness for *** {minute/hour:19697}.   Patient {ACTIONS;DENIES/REPORTS:21021675::Denies} nocturnal pain.  Difficulty dressing/grooming: {ACTIONS;DENIES/REPORTS:21021675::Denies} Difficulty climbing stairs: {ACTIONS;DENIES/REPORTS:21021675::Denies} Difficulty getting out of chair: {ACTIONS;DENIES/REPORTS:21021675::Denies} Difficulty using hands for taps, buttons, cutlery, and/or writing: {ACTIONS;DENIES/REPORTS:21021675::Denies}  No Rheumatology ROS completed.   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   Benign neoplasm of skin of lower limb 05/05/2024   Lentigo 05/05/2024   Melanocytic nevus of trunk 05/05/2024   Acute upper respiratory infection 05/26/2023   Left conjunctivitis 05/26/2023   Venous insufficiency 04/26/2023   Polyarthralgia 09/11/2022   Obesity 04/16/2022   HTN (hypertension) 03/13/2022   Migraine 03/15/2021   Vitamin D  deficiency 03/08/2020   Upper respiratory infection 01/06/2018   Cough 12/30/2017   Wheezing 12/30/2017   Rash and nonspecific skin eruption 05/27/2016   Sinusitis 10/11/2013   Factor V Leiden mutation (HCC)    Encounter for well adult exam with abnormal findings 07/06/2011   Asthma 04/10/2010   Tenosynovitis of foot and ankle 10/11/2009   Hyperlipidemia 06/24/2009   HAND PAIN, BILATERAL 06/24/2009   FATIGUE 06/24/2009   URINARY INCONTINENCE 06/24/2009   Allergic rhinitis 10/26/2007    Past Medical History:  Diagnosis Date   ALLERGIC RHINITIS 10/26/2007   ASTHMA  04/10/2010   Blood dyscrasia    factor V Leiden    Clotting disorder (HCC)    DYSLIPIDEMIA 06/24/2009   Factor V Leiden mutation (HCC)    hx of   Family history of anesthesia complication    PONV   GERD (gastroesophageal reflux disease)    Ovarian cyst    PONV (postoperative nausea and vomiting)     Family History  Problem Relation Age of Onset   Heart disease Mother    Heart attack Mother 89   Diabetes Mother    Hypertension Mother    Lupus Mother    Lupus Maternal Aunt    Lupus Maternal Aunt    Rheum arthritis Maternal Grandmother    Hypertension Maternal Grandfather    Heart disease Maternal Grandfather    Healthy Daughter    Polycystic ovary syndrome Daughter    Healthy Daughter    Diabetes Other    Hypertension Other    Breast cancer Neg Hx    Past Surgical History:  Procedure Laterality Date   BLADDER SUSPENSION  05/26/2012   Procedure: TRANSVAGINAL TAPE (TVT) PROCEDURE;  Surgeon: Rosaline DELENA Luna, MD;  Location: [facility];  Service: Gynecology;  Laterality: N/A;   BREAST BIOPSY Left 01/08/2023   MM LT BREAST BX W LOC DEV 1ST LESION IMAGE BX SPEC STEREO GUIDE 01/08/2023 [facility]   CYSTOCELE REPAIR  05/26/2012   Procedure: ANTERIOR REPAIR (CYSTOCELE);  Surgeon: Rosaline DELENA Luna, MD;  Location: [facility];  Service: Gynecology;  Laterality: N/A;   CYSTOSCOPY  05/26/2012   Procedure: CYSTOSCOPY;  Surgeon: Rosaline DELENA Luna, MD;  Location: [facility];  Service: Gynecology;  Laterality:  N/A;   LAPAROSCOPIC ASSISTED VAGINAL HYSTERECTOMY  05/26/2012   Procedure: LAPAROSCOPIC ASSISTED VAGINAL HYSTERECTOMY;  Surgeon: Peggye Gull, MD;  Location: [facility];  Service: Gynecology;  Laterality: N/A;   SALPINGOOPHORECTOMY  05/26/2012   Procedure: SALPINGO OOPHERECTOMY;  Surgeon: Peggye Gull, MD;  Location: [facility];  Service: Gynecology;  Laterality: Left;   TONSILLECTOMY AND ADENOIDECTOMY     WISDOM TOOTH EXTRACTION     Social History   Social History Narrative   Regular  exercise-yes   Husband has had a vasectomy   Immunization History  Administered Date(s) Administered   Fluzone Influenza virus vaccine,trivalent (IIV3), split virus 10/18/2019   Influenza Split 09/09/2012   Influenza Whole 08/21/2010, 09/07/2019   Influenza, Seasonal, Injecte, Preservative Fre 08/30/2023   Influenza,inj,Quad PF,6+ Mos 09/02/2016, 10/12/2018   Influenza-Unspecified 09/07/2015   PFIZER(Purple Top)SARS-COV-2 Vaccination 05/24/2020, 06/20/2020   PNEUMOCOCCAL CONJUGATE-20 04/26/2024   Tdap 10/04/2017   Zoster Recombinant(Shingrix ) 04/26/2024     Objective: Vital Signs: LMP 04/16/2012    Physical Exam   Musculoskeletal Exam: ***  CDAI Exam: CDAI Score: -- Patient Global: --; Provider Global: -- Swollen: --; Tender: -- Joint Exam 07/13/2024   No joint exam has been documented for this visit   There is currently no information documented on the homunculus. Go to the Rheumatology activity and complete the homunculus joint exam.  Investigation: No additional findings.  Imaging: No results found.  Recent Labs: Lab Results  Component Value Date   WBC 6.2 05/02/2024   HGB 12.9 05/02/2024   PLT 237.0 05/02/2024   NA 138 05/02/2024   K 3.8 05/02/2024   CL 104 05/02/2024   CO2 27 05/02/2024   GLUCOSE 89 05/02/2024   BUN 15 05/02/2024   CREATININE 0.80 05/02/2024   BILITOT 0.5 05/02/2024   ALKPHOS 87 05/02/2024   AST 12 05/02/2024   ALT 12 05/02/2024   PROT 6.4 05/02/2024   ALBUMIN 4.2 05/02/2024   CALCIUM  8.9 05/02/2024   GFRAA >90 05/12/2012    Speciality Comments: No specialty comments available.  Procedures:  No procedures performed Allergies: Clarithromycin, Hycodan [hydrocodone  bit-homatrop mbr], and Penicillins   Assessment / Plan:     Visit Diagnoses: Undifferentiated connective tissue disease (HCC)  High risk medication use  Primary osteoarthritis of both hands  Chronic pain of right knee  Primary osteoarthritis of both  feet  Fibromyalgia  Other insomnia  Other fatigue  Family history of systemic lupus erythematosus-mother, maternal aunts  Primary hypertension  Factor V Leiden mutation (HCC)  Mixed hyperlipidemia  History of DVT (deep vein thrombosis)  Mild intermittent asthma, unspecified whether complicated  History of gastroesophageal reflux (GERD)  Seasonal allergic rhinitis due to other allergic trigger  Orders: No orders of the defined types were placed in this encounter.  No orders of the defined types were placed in this encounter.   Face-to-face time spent with patient was *** minutes. Greater than 50% of time was spent in counseling and coordination of care.  Follow-Up Instructions: No follow-ups on file.   Waddell CHRISTELLA Craze, PA-C  Note - This record has been created using Dragon software.  Chart creation errors have been sought, but may not always  have been located. Such creation errors do not reflect on  the standard of medical care.

## 2024-07-05 ENCOUNTER — Other Ambulatory Visit: Payer: Self-pay | Admitting: *Deleted

## 2024-07-05 DIAGNOSIS — M359 Systemic involvement of connective tissue, unspecified: Secondary | ICD-10-CM

## 2024-07-05 DIAGNOSIS — Z79899 Other long term (current) drug therapy: Secondary | ICD-10-CM

## 2024-07-05 LAB — COMPREHENSIVE METABOLIC PANEL WITH GFR
AG Ratio: 2 (calc) (ref 1.0–2.5)
ALT: 11 U/L (ref 6–29)
AST: 12 U/L (ref 10–35)
Albumin: 4.3 g/dL (ref 3.6–5.1)
Alkaline phosphatase (APISO): 77 U/L (ref 31–125)
BUN: 15 mg/dL (ref 7–25)
CO2: 24 mmol/L (ref 20–32)
Calcium: 9.3 mg/dL (ref 8.6–10.2)
Chloride: 106 mmol/L (ref 98–110)
Creat: 0.87 mg/dL (ref 0.50–0.99)
Globulin: 2.1 g/dL (ref 1.9–3.7)
Glucose, Bld: 120 mg/dL — ABNORMAL HIGH (ref 65–99)
Potassium: 4.2 mmol/L (ref 3.5–5.3)
Sodium: 140 mmol/L (ref 135–146)
Total Bilirubin: 0.5 mg/dL (ref 0.2–1.2)
Total Protein: 6.4 g/dL (ref 6.1–8.1)
eGFR: 83 mL/min/1.73m2 (ref >=60)

## 2024-07-05 LAB — CBC WITH DIFFERENTIAL/PLATELET
Absolute Lymphocytes: 816 {cells}/uL — ABNORMAL LOW (ref 850–3900)
Absolute Monocytes: 413 {cells}/uL (ref 200–950)
Basophils Absolute: 42 {cells}/uL (ref 0–200)
Basophils Relative: 0.8 %
Eosinophils Absolute: 143 {cells}/uL (ref 15–500)
Eosinophils Relative: 2.7 %
HCT: 39.9 % (ref 35.0–45.0)
Hemoglobin: 12.8 g/dL (ref 11.7–15.5)
MCH: 29.1 pg (ref 27.0–33.0)
MCHC: 32.1 g/dL (ref 32.0–36.0)
MCV: 90.7 fL (ref 80.0–100.0)
MPV: 10.3 fL (ref 7.5–12.5)
Monocytes Relative: 7.8 %
Neutro Abs: 3885 {cells}/uL (ref 1500–7800)
Neutrophils Relative %: 73.3 %
Platelets: 228 Thousand/uL (ref 140–400)
RBC: 4.4 Million/uL (ref 3.80–5.10)
RDW: 12.6 % (ref 11.0–15.0)
Total Lymphocyte: 15.4 %
WBC: 5.3 Thousand/uL (ref 3.8–10.8)

## 2024-07-06 ENCOUNTER — Ambulatory Visit: Payer: Self-pay | Admitting: Physician Assistant

## 2024-07-06 NOTE — Progress Notes (Signed)
Ok to postpone visit.

## 2024-07-13 ENCOUNTER — Ambulatory Visit: Admitting: Physician Assistant

## 2024-07-13 DIAGNOSIS — J3089 Other allergic rhinitis: Secondary | ICD-10-CM

## 2024-07-13 DIAGNOSIS — I10 Essential (primary) hypertension: Secondary | ICD-10-CM

## 2024-07-13 DIAGNOSIS — Z8269 Family history of other diseases of the musculoskeletal system and connective tissue: Secondary | ICD-10-CM

## 2024-07-13 DIAGNOSIS — Z86718 Personal history of other venous thrombosis and embolism: Secondary | ICD-10-CM

## 2024-07-13 DIAGNOSIS — J4520 Mild intermittent asthma, uncomplicated: Secondary | ICD-10-CM

## 2024-07-13 DIAGNOSIS — M359 Systemic involvement of connective tissue, unspecified: Secondary | ICD-10-CM

## 2024-07-13 DIAGNOSIS — G8929 Other chronic pain: Secondary | ICD-10-CM

## 2024-07-13 DIAGNOSIS — Z79899 Other long term (current) drug therapy: Secondary | ICD-10-CM

## 2024-07-13 DIAGNOSIS — Z8719 Personal history of other diseases of the digestive system: Secondary | ICD-10-CM

## 2024-07-13 DIAGNOSIS — M797 Fibromyalgia: Secondary | ICD-10-CM

## 2024-07-13 DIAGNOSIS — D6851 Activated protein C resistance: Secondary | ICD-10-CM

## 2024-07-13 DIAGNOSIS — M19041 Primary osteoarthritis, right hand: Secondary | ICD-10-CM

## 2024-07-13 DIAGNOSIS — R5383 Other fatigue: Secondary | ICD-10-CM

## 2024-07-13 DIAGNOSIS — G4709 Other insomnia: Secondary | ICD-10-CM

## 2024-07-13 DIAGNOSIS — E782 Mixed hyperlipidemia: Secondary | ICD-10-CM

## 2024-07-13 DIAGNOSIS — M19072 Primary osteoarthritis, left ankle and foot: Secondary | ICD-10-CM

## 2024-07-19 ENCOUNTER — Other Ambulatory Visit: Payer: Self-pay | Admitting: Internal Medicine

## 2024-08-25 ENCOUNTER — Other Ambulatory Visit: Payer: Self-pay | Admitting: Rheumatology

## 2024-08-25 DIAGNOSIS — M359 Systemic involvement of connective tissue, unspecified: Secondary | ICD-10-CM

## 2024-08-25 NOTE — Telephone Encounter (Addendum)
 Last Fill: 04/20/2024  Eye exam: not on file   Labs: 07/05/2024 Glucose is 120. Rest of CMP WNL.  Absolute lymphocytes are borderline low. Rest of CBC WNL.  We will continue to monitor closely.   Next Visit: 09/21/2024  Last Visit: 04/20/2024  IK:Lwipqqzmzwupjuzi connective tissue disease   Current Dose per office note on 04/20/2024: hydroxychloroquine  200 mg p.o. twice daily.   Patient states the 07/05/2024 were her 1 month labs after starting PLQ and she had her eye exam done at [facility]. I have called to obtain the report.   Okay to refill Plaquenil ?

## 2024-08-29 ENCOUNTER — Encounter: Payer: Self-pay | Admitting: Internal Medicine

## 2024-08-30 ENCOUNTER — Other Ambulatory Visit: Payer: Self-pay

## 2024-08-30 MED ORDER — BUDESONIDE-FORMOTEROL FUMARATE 160-4.5 MCG/ACT IN AERO: 2.0000 | INHALATION_SPRAY | Freq: Two times a day (BID) | RESPIRATORY_TRACT | 11 refills | Status: AC

## 2024-09-04 ENCOUNTER — Other Ambulatory Visit: Payer: Self-pay | Admitting: *Deleted

## 2024-09-04 ENCOUNTER — Telehealth: Payer: Self-pay | Admitting: Rheumatology

## 2024-09-04 DIAGNOSIS — Z79899 Other long term (current) drug therapy: Secondary | ICD-10-CM

## 2024-09-04 DIAGNOSIS — M359 Systemic involvement of connective tissue, unspecified: Secondary | ICD-10-CM

## 2024-09-04 NOTE — Telephone Encounter (Signed)
 Patient contacted the office requesting lab orders to be be release to [facility] at [address] [facility] [address], [place], [place].   Patient plans to have labs this afternoon 09/04/24 at 2:00 pm.

## 2024-09-04 NOTE — Telephone Encounter (Signed)
 Lab Orders released.

## 2024-09-06 ENCOUNTER — Ambulatory Visit: Payer: Self-pay | Admitting: Rheumatology

## 2024-09-06 ENCOUNTER — Ambulatory Visit: Admitting: Physician Assistant

## 2024-09-06 LAB — COMPREHENSIVE METABOLIC PANEL WITH GFR
AG Ratio: 2.4 (calc) (ref 1.0–2.5)
ALT: 13 U/L (ref 6–29)
AST: 12 U/L (ref 10–35)
Albumin: 4.7 g/dL (ref 3.6–5.1)
Alkaline phosphatase (APISO): 81 U/L (ref 31–125)
BUN: 13 mg/dL (ref 7–25)
CO2: 30 mmol/L (ref 20–32)
Calcium: 9.6 mg/dL (ref 8.6–10.2)
Chloride: 105 mmol/L (ref 98–110)
Creat: 0.85 mg/dL (ref 0.50–0.99)
Globulin: 2 g/dL (ref 1.9–3.7)
Glucose, Bld: 89 mg/dL (ref 65–99)
Potassium: 4.7 mmol/L (ref 3.5–5.3)
Sodium: 139 mmol/L (ref 135–146)
Total Bilirubin: 0.5 mg/dL (ref 0.2–1.2)
Total Protein: 6.7 g/dL (ref 6.1–8.1)
eGFR: 84 mL/min/1.73m2 (ref >=60)

## 2024-09-06 LAB — CBC WITH DIFFERENTIAL/PLATELET
Absolute Lymphocytes: 832 {cells}/uL — ABNORMAL LOW (ref 850–3900)
Absolute Monocytes: 460 {cells}/uL (ref 200–950)
Basophils Absolute: 38 {cells}/uL (ref 0–200)
Basophils Relative: 0.6 %
Eosinophils Absolute: 107 {cells}/uL (ref 15–500)
Eosinophils Relative: 1.7 %
HCT: 40.9 % (ref 35.0–45.0)
Hemoglobin: 13.4 g/dL (ref 11.7–15.5)
MCH: 29.5 pg (ref 27.0–33.0)
MCHC: 32.8 g/dL (ref 32.0–36.0)
MCV: 89.9 fL (ref 80.0–100.0)
MPV: 10 fL (ref 7.5–12.5)
Monocytes Relative: 7.3 %
Neutro Abs: 4864 {cells}/uL (ref 1500–7800)
Neutrophils Relative %: 77.2 %
Platelets: 254 Thousand/uL (ref 140–400)
RBC: 4.55 Million/uL (ref 3.80–5.10)
RDW: 13.1 % (ref 11.0–15.0)
Total Lymphocyte: 13.2 %
WBC: 6.3 Thousand/uL (ref 3.8–10.8)

## 2024-09-06 NOTE — Progress Notes (Signed)
CBC and CMP are stable.  Lymphocyte count is low due to immunosuppression.

## 2024-09-07 NOTE — Progress Notes (Signed)
 Office Visit Note  Patient: Robin Ayers             Date of Birth: 01-30-1976           MRN: [id number]             PCP: Norleen Lynwood ORN, MD Referring: Norleen Lynwood ORN, MD Visit Date: 09/21/2024 Occupation: TEACHER   Subjective:  Medication monitoring  History of Present Illness: Robin Ayers is a 48 y.o. female with history of undifferentiated connective tissue disase.  Patient takes plaquenil  200 mg 1 tablet by mouth twice daily. Plaquenil  was added after last office visit on 04/20/24 but did not start until July 2025.  She has been tolerating Plaquenil  without any side effects and has not had any gaps in therapy.  Patient denies any flares since initiating Plaquenil .  She has noticed a significant improvement in her energy level.  Patient continues to have chronic myofascial pain due to fibromyalgia.  She has been having to take Motrin  4 tablets twice daily for symptomatic relief.  Patient would like to discuss treatment alternatives besides NSAIDs for management of fibromyalgia.     Activities of Daily Living:  Patient reports morning stiffness for 1 hour.   Patient Reports nocturnal pain.  Difficulty dressing/grooming: Denies Difficulty climbing stairs: Denies Difficulty getting out of chair: Denies Difficulty using hands for taps, buttons, cutlery, and/or writing: Denies  Review of Systems  Constitutional:  Positive for fatigue.  HENT:  Positive for mouth dryness. Negative for mouth sores.   Eyes:  Positive for dryness.  Cardiovascular:  Negative for chest pain and palpitations.  Gastrointestinal:  Negative for blood in stool, constipation and diarrhea.  Endocrine: Positive for increased urination.  Genitourinary:  Negative for involuntary urination.  Musculoskeletal:  Positive for joint pain, gait problem, joint pain, myalgias, muscle weakness, morning stiffness, muscle tenderness and myalgias. Negative for joint swelling.  Skin:  Negative for color  change, rash, hair loss and sensitivity to sunlight.  Allergic/Immunologic: Positive for susceptible to infections.  Neurological:  Positive for dizziness and headaches.  Hematological:  Negative for swollen glands.  Psychiatric/Behavioral:  Positive for sleep disturbance. Negative for depressed mood. The patient is not nervous/anxious.     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   Benign neoplasm of skin of lower limb 05/05/2024   Lentigo 05/05/2024   Melanocytic nevus of trunk 05/05/2024   Acute upper respiratory infection 05/26/2023   Left conjunctivitis 05/26/2023   Venous insufficiency 04/26/2023   Polyarthralgia 09/11/2022   Obesity 04/16/2022   HTN (hypertension) 03/13/2022   Migraine 03/15/2021   Vitamin D  deficiency 03/08/2020   Upper respiratory infection 01/06/2018   Cough 12/30/2017   Wheezing 12/30/2017   Rash and nonspecific skin eruption 05/27/2016   Sinusitis 10/11/2013   Factor V Leiden mutation    Encounter for well adult exam with abnormal findings 07/06/2011   Asthma 04/10/2010   Tenosynovitis of foot and ankle 10/11/2009   Hyperlipidemia 06/24/2009   HAND PAIN, BILATERAL 06/24/2009   FATIGUE 06/24/2009   URINARY INCONTINENCE 06/24/2009   Allergic rhinitis 10/26/2007    Past Medical History:  Diagnosis Date   ALLERGIC RHINITIS 10/26/2007   ASTHMA 04/10/2010   Blood dyscrasia    factor V Leiden    Clotting disorder    DYSLIPIDEMIA 06/24/2009   Factor V Leiden mutation    hx of   Family history of anesthesia complication    PONV  GERD (gastroesophageal reflux disease)    Ovarian cyst    PONV (postoperative nausea and vomiting)     Family History  Problem Relation Age of Onset   Heart disease Mother    Heart attack Mother 77   Diabetes Mother    Hypertension Mother    Lupus Mother    Lupus Maternal Aunt    Lupus Maternal Aunt    Rheum arthritis Maternal Grandmother    Hypertension Maternal  Grandfather    Heart disease Maternal Grandfather    Healthy Daughter    Polycystic ovary syndrome Daughter    Healthy Daughter    Diabetes Other    Hypertension Other    Breast cancer Neg Hx    Past Surgical History:  Procedure Laterality Date   BLADDER SUSPENSION  05/26/2012   Procedure: TRANSVAGINAL TAPE (TVT) PROCEDURE;  Surgeon: Rosaline DELENA Luna, MD;  Location: [facility];  Service: Gynecology;  Laterality: N/A;   BREAST BIOPSY Left 01/08/2023   MM LT BREAST BX W LOC DEV 1ST LESION IMAGE BX SPEC STEREO GUIDE 01/08/2023 [facility]   CYSTOCELE REPAIR  05/26/2012   Procedure: ANTERIOR REPAIR (CYSTOCELE);  Surgeon: Rosaline DELENA Luna, MD;  Location: [facility];  Service: Gynecology;  Laterality: N/A;   CYSTOSCOPY  05/26/2012   Procedure: CYSTOSCOPY;  Surgeon: Rosaline DELENA Luna, MD;  Location: [facility];  Service: Gynecology;  Laterality: N/A;   LAPAROSCOPIC ASSISTED VAGINAL HYSTERECTOMY  05/26/2012   Procedure: LAPAROSCOPIC ASSISTED VAGINAL HYSTERECTOMY;  Surgeon: Peggye Gull, MD;  Location: [facility];  Service: Gynecology;  Laterality: N/A;   SALPINGOOPHORECTOMY  05/26/2012   Procedure: SALPINGO OOPHERECTOMY;  Surgeon: Peggye Gull, MD;  Location: [facility];  Service: Gynecology;  Laterality: Left;   TONSILLECTOMY AND ADENOIDECTOMY     WISDOM TOOTH EXTRACTION     Social History   Tobacco Use   Smoking status: Former    Current packs/day: 0.00    Types: Cigarettes    Quit date: 2005    Years since quitting: 20.8    Passive exposure: Never   Smokeless tobacco: Never  Vaping Use   Vaping status: Never Used  Substance Use Topics   Alcohol use: No   Drug use: No   Social History   Social History Narrative   Regular exercise-yes   Husband has had a vasectomy     Immunization History  Administered Date(s) Administered   Fluzone Influenza virus vaccine,trivalent (IIV3), split virus 10/18/2019   Influenza Split 09/09/2012   Influenza Whole 08/21/2010, 09/07/2019   Influenza,  Seasonal, Injecte, Preservative Fre 08/30/2023   Influenza,inj,Quad PF,6+ Mos 09/02/2016, 10/12/2018   Influenza-Unspecified 09/07/2015   PFIZER(Purple Top)SARS-COV-2 Vaccination 05/24/2020, 06/20/2020   PNEUMOCOCCAL CONJUGATE-20 04/26/2024   Tdap 10/04/2017   Zoster Recombinant(Shingrix ) 04/26/2024, 09/21/2024     Objective: Vital Signs: BP 126/79   Pulse 80   Temp (!) 97.4 F (36.3 C)   Resp 16   Ht 5' 9 (1.753 m)   Wt 258 lb (117 kg)   LMP 04/16/2012   BMI 38.10 kg/m    Physical Exam Vitals and nursing note reviewed.  Constitutional:      Appearance: She is well-developed.  HENT:     Head: Normocephalic and atraumatic.  Eyes:     Conjunctiva/sclera: Conjunctivae normal.  Cardiovascular:     Rate and Rhythm: Normal rate and regular rhythm.     Heart sounds: Normal heart sounds.  Pulmonary:     Effort: Pulmonary effort is normal.     Breath sounds:  Normal breath sounds.  Abdominal:     General: Bowel sounds are normal.     Palpations: Abdomen is soft.  Musculoskeletal:     Cervical back: Normal range of motion.  Lymphadenopathy:     Cervical: No cervical adenopathy.  Skin:    General: Skin is warm and dry.     Capillary Refill: Capillary refill takes less than 2 seconds.  Neurological:     Mental Status: She is alert and oriented to person, place, and time.  Psychiatric:        Behavior: Behavior normal.      Musculoskeletal Exam: Generalized hyperalgesia and positive tender points noted.  C-spine, thoracic spine, lumbar spine have good range of motion.  No midline spinal tenderness.  No SI joint tenderness.  Shoulder joints, elbow joints, wrist joints, MCPs, PIPs, DIPs have good range of motion with no synovitis.  Complete fist formation bilaterally.  Hip joints have good range of motion with no groin pain.  Knee joints have good range of motion no warmth or effusion.  Ankle joints have good range of motion no tenderness or joint swelling.  No evidence of  Achilles tendinitis or plantar fasciitis.   CDAI Exam: CDAI Score: -- Patient Global: --; Provider Global: -- Swollen: --; Tender: -- Joint Exam 09/21/2024   No joint exam has been documented for this visit   There is currently no information documented on the homunculus. Go to the Rheumatology activity and complete the homunculus joint exam.  Investigation: No additional findings.  Imaging: No results found.  Recent Labs: Lab Results  Component Value Date   WBC 6.3 09/05/2024   HGB 13.4 09/05/2024   PLT 254 09/05/2024   NA 139 09/05/2024   K 4.7 09/05/2024   CL 105 09/05/2024   CO2 30 09/05/2024   GLUCOSE 89 09/05/2024   BUN 13 09/05/2024   CREATININE 0.85 09/05/2024   BILITOT 0.5 09/05/2024   ALKPHOS 87 05/02/2024   AST 12 09/05/2024   ALT 13 09/05/2024   PROT 6.7 09/05/2024   ALBUMIN 4.2 05/02/2024   CALCIUM  9.6 09/05/2024   GFRAA >90 05/12/2012    Speciality Comments: No specialty comments available.  Procedures:  No procedures performed Allergies: Clarithromycin, Hycodan [hydrocodone  bit-homatrop mbr], and Penicillins    Assessment / Plan:     Visit Diagnoses: Undifferentiated connective tissue disease - ANA 1: 160NS, SSB Ab positive.  History of fatigue, arthralgias, hair loss, dry eyes and photosensitivity: Patient initiated Plaquenil  200 mg 1 tablet by mouth twice daily in July 2025.  She has not had any signs or symptoms of a flare since initiating Plaquenil .  Her energy level has improved.  She has no oral or nasal ulcerations.  Her sicca symptoms have improved.  No synovitis noted on examination today.  She continues to experience myofascial pain secondary to fibromyalgia.  She has been having to take Motrin  4 tablets twice daily for symptomatic relief.  She presented today to discuss treatment options for management of fibromyalgia.  Plan to initiate Cymbalta 30 mg 1 capsule daily as discussed below. She will remain on Plaquenil  200 mg 1 tablet by mouth  twice daily.  She was advised notify us  if she develops any new or worsening symptoms. Future lab orders placed today.   She will follow up in 3 months or sooner if needed.  - Plan: C3 and C4, Sedimentation rate, Anti-DNA antibody, double-stranded, CBC with Differential/Platelet, Protein / creatinine ratio, urine, ANA, hydroxychloroquine  (PLAQUENIL ) 200 MG tablet, Comprehensive  metabolic panel with GFR  High risk medication use - Plaquenil  200 mg 1 tablet by mouth twice daily.  CBC and CMP updated on 09/05/24.  No plaquenil  eye examination on file.  She has had a baseline Plaquenil  eye examination at [facility] so we will call to obtain these records.  - Plan: CBC with Differential/Platelet, Comprehensive metabolic panel with GFR  Primary osteoarthritis of both hands - X-rays obtained in the past and clinical findings are consistent with early osteoarthritis.  No synovitis noted.  Chronic pain of right knee: Previous x-rays unremarkable.  No warmth or effusion noted.  Primary osteoarthritis of both feet: Ankle joints have good range of motion with no tenderness or synovitis.  Intermittent discomfort in both feet.  Fibromyalgia: She has generalized hyperalgesia and positive tender points on exam.  Patient continues to experience myofascial pain on a daily basis.  She has been having to take Motrin  4 tablets twice daily for symptomatic relief.  She experiences nocturnal pain at times.  She presented today to discuss treatment options for management of fibromyalgia.  Discussed the option of Lyrica, Cymbalta, or gabapentin.  Reviewed the indications, contraindications, potential side effects of Cymbalta today in detail.  All questions were addressed.  Plan to initiate Cymbalta 30 mg 1 capsule daily.  She will notify us  if she cannot tolerate taking Cymbalta.  After 1 month of use she will notify us  if she would like to try increasing to 60 mg or continue 30 mg daily. Plan to hold off on starting  gabapentin or Lyrica.  Other insomnia: She has difficulty sleeping at night due to nocturnal pain.  She has been taking Motrin  4 tablets at bedtime to help alleviate the symptoms.  She is hoping to reduce the dose of Motrin  if her symptoms from fibromyalgia can be better controlled.  She will be initiating Cymbalta 30 mg daily as discussed above.  Other fatigue: Chronic, stable.  Other medical conditions are listed as follows:  Family history of systemic lupus erythematosus-mother, maternal aunts  Primary hypertension: Blood pressure is 126/79 today in the office.  Factor V Leiden mutation  Mixed hyperlipidemia  History of DVT (deep vein thrombosis): She had DVT as a teenager while she was on oral contraceptives. Anticardiolipin antibodies, beta-2  GP 1 antibodies, lupus anticoagulant were all negative.   Mild intermittent asthma, unspecified whether complicated  History of gastroesophageal reflux (GERD)  Seasonal allergic rhinitis due to other allergic trigger    Orders: Orders Placed This Encounter  Procedures   C3 and C4   Sedimentation rate   Anti-DNA antibody, double-stranded   CBC with Differential/Platelet   Protein / creatinine ratio, urine   ANA   Comprehensive metabolic panel with GFR   Meds ordered this encounter  Medications   DULoxetine (CYMBALTA) 30 MG capsule    Sig: Take 1 capsule (30 mg total) by mouth daily.    Dispense:  30 capsule    Refill:  0   hydroxychloroquine  (PLAQUENIL ) 200 MG tablet    Sig: Take 1 tablet (200 mg total) by mouth 2 (two) times daily.    Dispense:  180 tablet    Refill:  0     Follow-Up Instructions: Return in about 3 months (around 12/22/2024) for UCTD.   Waddell CHRISTELLA Craze, PA-C  Note - This record has been created using Dragon software.  Chart creation errors have been sought, but may not always  have been located. Such creation errors do not reflect on  the standard  of medical care.

## 2024-09-21 ENCOUNTER — Ambulatory Visit: Attending: Physician Assistant | Admitting: Physician Assistant

## 2024-09-21 ENCOUNTER — Encounter: Payer: Self-pay | Admitting: Physician Assistant

## 2024-09-21 ENCOUNTER — Ambulatory Visit

## 2024-09-21 VITALS — BP 126/79 | HR 80 | Temp 97.4°F | Resp 16 | Ht 69.0 in | Wt 258.0 lb

## 2024-09-21 DIAGNOSIS — D6851 Activated protein C resistance: Secondary | ICD-10-CM

## 2024-09-21 DIAGNOSIS — R5383 Other fatigue: Secondary | ICD-10-CM

## 2024-09-21 DIAGNOSIS — M359 Systemic involvement of connective tissue, unspecified: Secondary | ICD-10-CM | POA: Diagnosis not present

## 2024-09-21 DIAGNOSIS — M19041 Primary osteoarthritis, right hand: Secondary | ICD-10-CM

## 2024-09-21 DIAGNOSIS — G8929 Other chronic pain: Secondary | ICD-10-CM

## 2024-09-21 DIAGNOSIS — M25561 Pain in right knee: Secondary | ICD-10-CM

## 2024-09-21 DIAGNOSIS — Z79899 Other long term (current) drug therapy: Secondary | ICD-10-CM

## 2024-09-21 DIAGNOSIS — G4709 Other insomnia: Secondary | ICD-10-CM

## 2024-09-21 DIAGNOSIS — M19071 Primary osteoarthritis, right ankle and foot: Secondary | ICD-10-CM

## 2024-09-21 DIAGNOSIS — Z8719 Personal history of other diseases of the digestive system: Secondary | ICD-10-CM

## 2024-09-21 DIAGNOSIS — Z86718 Personal history of other venous thrombosis and embolism: Secondary | ICD-10-CM

## 2024-09-21 DIAGNOSIS — Z8269 Family history of other diseases of the musculoskeletal system and connective tissue: Secondary | ICD-10-CM

## 2024-09-21 DIAGNOSIS — I10 Essential (primary) hypertension: Secondary | ICD-10-CM

## 2024-09-21 DIAGNOSIS — J4520 Mild intermittent asthma, uncomplicated: Secondary | ICD-10-CM

## 2024-09-21 DIAGNOSIS — E782 Mixed hyperlipidemia: Secondary | ICD-10-CM

## 2024-09-21 DIAGNOSIS — M19042 Primary osteoarthritis, left hand: Secondary | ICD-10-CM

## 2024-09-21 DIAGNOSIS — J3089 Other allergic rhinitis: Secondary | ICD-10-CM

## 2024-09-21 DIAGNOSIS — Z23 Encounter for immunization: Secondary | ICD-10-CM

## 2024-09-21 DIAGNOSIS — M19072 Primary osteoarthritis, left ankle and foot: Secondary | ICD-10-CM

## 2024-09-21 DIAGNOSIS — M797 Fibromyalgia: Secondary | ICD-10-CM

## 2024-09-21 MED ORDER — DULOXETINE HCL 30 MG PO CPEP: 30.0000 mg | ORAL_CAPSULE | Freq: Every day | ORAL | 0 refills | Status: DC

## 2024-09-21 MED ORDER — HYDROXYCHLOROQUINE SULFATE 200 MG PO TABS: 200.0000 mg | ORAL_TABLET | Freq: Two times a day (BID) | ORAL | 0 refills | Status: DC

## 2024-09-21 NOTE — Progress Notes (Signed)
 Pt received the shingles vaccine without complications

## 2024-09-23 ENCOUNTER — Other Ambulatory Visit: Payer: Self-pay | Admitting: Physician Assistant

## 2024-09-23 DIAGNOSIS — M359 Systemic involvement of connective tissue, unspecified: Secondary | ICD-10-CM

## 2024-10-15 ENCOUNTER — Other Ambulatory Visit: Payer: Self-pay | Admitting: Physician Assistant

## 2024-10-16 NOTE — Telephone Encounter (Signed)
 Last Fill: 09/21/2024  Next Visit: 12/29/2024  Last Visit: 09/21/2024  Dx: Fibromyalgia   Current Dose per office note on 09/21/2024: initiate Cymbalta 30 mg 1 capsule daily. She will notify us  if she cannot tolerate taking Cymbalta. After 1 month of use she will notify us  if she would like to try increasing to 60 mg or continue 30 mg daily.   Attempted to contact the patient to verify of she is tolerating the medication and if she would like to increase the dose or continue current dose.   Okay to refill Cymbalta?

## 2024-10-16 NOTE — Telephone Encounter (Signed)
 Patient returned call to the office and she is tolerating the 30 mg now. Patient states she would like to continue on the 30 mg at this time.

## 2024-10-18 ENCOUNTER — Telehealth: Payer: Self-pay

## 2024-10-18 NOTE — Telephone Encounter (Signed)
 Ok to take tylenol  as needed.  Patient may want to reach out to PCP if symptoms persist to determine if she needs to be evaluated.

## 2024-10-18 NOTE — Telephone Encounter (Signed)
 Patient contacted the office and states she is having some sinus and cold symptoms. Patient states she is on Cymbalta and Plaquenil  and inquires what she can take over the counter for her cough and other symptoms that would not interfere with her medication.

## 2024-10-19 NOTE — Telephone Encounter (Signed)
 Patient advised Ok to take tylenol  as needed.  Patient may want to reach out to PCP if symptoms persist to determine if she needs to be evaluated. Patient verbalized understanding.   Patient inquired if she could take over the counter cold medications, like mucinex . Please advise.

## 2024-10-19 NOTE — Telephone Encounter (Signed)
Ok to take mucinex

## 2024-10-19 NOTE — Telephone Encounter (Signed)
 I called patient, patient verbalized understanding.

## 2024-10-24 ENCOUNTER — Other Ambulatory Visit: Payer: Self-pay | Admitting: Internal Medicine

## 2024-10-24 ENCOUNTER — Other Ambulatory Visit: Payer: Self-pay

## 2024-12-15 NOTE — Progress Notes (Unsigned)
 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 female with a past medical history undifferentiated connective tissue disease.  Patient remains on plaquenil  200 mg 1 tablet by mouth twice daily.    Activities of Daily Living:  Patient reports morning stiffness for *** {minute/hour:19697}.   Patient {ACTIONS;DENIES/REPORTS:21021675::Denies} nocturnal pain.  Difficulty dressing/grooming: {ACTIONS;DENIES/REPORTS:21021675::Denies} Difficulty climbing stairs: {ACTIONS;DENIES/REPORTS:21021675::Denies} Difficulty getting out of chair: {ACTIONS;DENIES/REPORTS:21021675::Denies} Difficulty using hands for taps, buttons, cutlery, and/or writing: {ACTIONS;DENIES/REPORTS:21021675::Denies}  No Rheumatology ROS completed.   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   Benign neoplasm of skin of lower limb 05/05/2024   Lentigo 05/05/2024   Melanocytic nevus of trunk 05/05/2024   Acute upper respiratory infection 05/26/2023   Left conjunctivitis 05/26/2023   Venous insufficiency 04/26/2023   Polyarthralgia 09/11/2022   Obesity 04/16/2022   HTN (hypertension) 03/13/2022   Migraine 03/15/2021   Vitamin D  deficiency 03/08/2020   Upper respiratory infection 01/06/2018   Cough 12/30/2017   Wheezing 12/30/2017   Rash and nonspecific skin eruption 05/27/2016   Sinusitis 10/11/2013   Factor V Leiden mutation    Encounter for well adult exam with abnormal findings 07/06/2011   Asthma 04/10/2010   Tenosynovitis of foot and ankle 10/11/2009   Hyperlipidemia 06/24/2009   HAND PAIN, BILATERAL 06/24/2009   FATIGUE 06/24/2009   URINARY INCONTINENCE 06/24/2009    Allergic rhinitis 10/26/2007    Past Medical History:  Diagnosis Date   ALLERGIC RHINITIS 10/26/2007   ASTHMA 04/10/2010   Blood dyscrasia    factor V Leiden    Clotting disorder    DYSLIPIDEMIA 06/24/2009   Factor V Leiden mutation    hx of   Family history of anesthesia complication    PONV   GERD (gastroesophageal reflux disease)    Ovarian cyst    PONV (postoperative nausea and vomiting)     Family History  Problem Relation Age of Onset   Heart disease Mother    Heart attack Mother 54   Diabetes Mother    Hypertension Mother    Lupus Mother    Lupus Maternal Aunt    Lupus Maternal Aunt    Rheum arthritis Maternal Grandmother    Hypertension Maternal Grandfather    Heart disease Maternal Grandfather    Healthy Daughter    Polycystic ovary syndrome Daughter    Healthy Daughter    Diabetes Other    Hypertension Other    Breast cancer Neg Hx    Past Surgical History:  Procedure Laterality Date   BLADDER SUSPENSION  05/26/2012   Procedure: TRANSVAGINAL TAPE (TVT) PROCEDURE;  Surgeon: Rosaline DELENA Luna, MD;  Location: [facility];  Service: Gynecology;  Laterality: N/A;   BREAST BIOPSY Left 01/08/2023   MM LT BREAST BX W LOC DEV 1ST LESION IMAGE BX SPEC STEREO GUIDE 01/08/2023 [facility]   CYSTOCELE REPAIR  05/26/2012   Procedure: ANTERIOR REPAIR (CYSTOCELE);  Surgeon: Rosaline DELENA Luna, MD;  Location: [facility];  Service: Gynecology;  Laterality: N/A;   CYSTOSCOPY  05/26/2012   Procedure:  CYSTOSCOPY;  Surgeon: Rosaline DELENA Luna, MD;  Location: [facility];  Service: Gynecology;  Laterality: N/A;   LAPAROSCOPIC ASSISTED VAGINAL HYSTERECTOMY  05/26/2012   Procedure: LAPAROSCOPIC ASSISTED VAGINAL HYSTERECTOMY;  Surgeon: Peggye Gull, MD;  Location: [facility];  Service: Gynecology;  Laterality: N/A;   SALPINGOOPHORECTOMY  05/26/2012   Procedure: SALPINGO OOPHERECTOMY;  Surgeon: Peggye Gull, MD;  Location: [facility];  Service: Gynecology;  Laterality: Left;   TONSILLECTOMY AND  ADENOIDECTOMY     WISDOM TOOTH EXTRACTION     Social History[1] Social History   Social History Narrative   Regular exercise-yes   Husband has had a vasectomy     Immunization History  Administered Date(s) Administered   Fluzone Influenza virus vaccine,trivalent (IIV3), split virus 10/18/2019   Influenza Split 09/09/2012   Influenza Whole 08/21/2010, 09/07/2019   Influenza, Seasonal, Injecte, Preservative Fre 08/30/2023   Influenza,inj,Quad PF,6+ Mos 09/02/2016, 10/12/2018   Influenza-Unspecified 09/07/2015   PFIZER(Purple Top)SARS-COV-2 Vaccination 05/24/2020, 06/20/2020   PNEUMOCOCCAL CONJUGATE-20 04/26/2024   Tdap 10/04/2017   Zoster Recombinant(Shingrix ) 04/26/2024, 09/21/2024     Objective: Vital Signs: LMP 04/16/2012    Physical Exam   Musculoskeletal Exam: ***  CDAI Exam: CDAI Score: -- Patient Global: --; Provider Global: -- Swollen: --; Tender: -- Joint Exam 12/29/2024   No joint exam has been documented for this visit   There is currently no information documented on the homunculus. Go to the Rheumatology activity and complete the homunculus joint exam.  Investigation: No additional findings.  Imaging: No results found.  Recent Labs: Lab Results  Component Value Date   WBC 6.3 09/05/2024   HGB 13.4 09/05/2024   PLT 254 09/05/2024   NA 139 09/05/2024   K 4.7 09/05/2024   CL 105 09/05/2024   CO2 30 09/05/2024   GLUCOSE 89 09/05/2024   BUN 13 09/05/2024   CREATININE 0.85 09/05/2024   BILITOT 0.5 09/05/2024   ALKPHOS 87 05/02/2024   AST 12 09/05/2024   ALT 13 09/05/2024   PROT 6.7 09/05/2024   ALBUMIN 4.2 05/02/2024   CALCIUM  9.6 09/05/2024   GFRAA >90 05/12/2012    Speciality Comments: PLQ Eye Exam: Patient states she had her exam done at [facility] at [facility], contacted them and they state the patient had a VF on 07/05/2024 but there is no documentation and the patient had a regular eye exam on 05/10/2024. Waiting on the eye exam  to be faxed over. The eye doctor will fill out our paper and they plan to fax it over next week.   Procedures:  No procedures performed Allergies: Clarithromycin, Hycodan [hydrocodone  bit-homatrop mbr], and Penicillins   Assessment / Plan:     Visit Diagnoses: Undifferentiated connective tissue disease  High risk medication use  Primary osteoarthritis of both hands  Chronic pain of right knee  Primary osteoarthritis of both feet  Fibromyalgia  Other insomnia  Other fatigue  Family history of systemic lupus erythematosus-mother, maternal aunts  Primary hypertension  Factor V Leiden mutation  Mixed hyperlipidemia  History of DVT (deep vein thrombosis)  Mild intermittent asthma, unspecified whether complicated  History of gastroesophageal reflux (GERD)  Seasonal allergic rhinitis due to other allergic trigger  Orders: No orders of the defined types were placed in this encounter.  No orders of the defined types were placed in this encounter.   Face-to-face time spent with patient was *** minutes. Greater than 50% of time was spent in counseling and coordination of care.  Follow-Up Instructions: No follow-ups  on file.   Waddell CHRISTELLA Craze, PA-C  Note - This record has been created using Dragon software.  Chart creation errors have been sought, but may not always  have been located. Such creation errors do not reflect on  the standard of medical care.     [1]  Social History Tobacco Use   Smoking status: Former    Current packs/day: 0.00    Types: Cigarettes    Quit date: 2005    Years since quitting: 21.0    Passive exposure: Never   Smokeless tobacco: Never  Vaping Use   Vaping status: Never Used  Substance Use Topics   Alcohol use: No   Drug use: No   "

## 2024-12-16 ENCOUNTER — Other Ambulatory Visit: Payer: Self-pay | Admitting: Physician Assistant

## 2024-12-16 DIAGNOSIS — M359 Systemic involvement of connective tissue, unspecified: Secondary | ICD-10-CM

## 2024-12-18 NOTE — Telephone Encounter (Signed)
 Last Fill: 09/21/2024  Eye exam: Patient states she had her exam done at [facility], contacted them and they state the patient had a VF on 07/05/2024 but there is no documentation and the patient had a regular eye exam on 05/10/2024.   Labs: 09/05/2024 CBC and CMP are stable. Lymphocyte count is low due to immunosuppression.   Next Visit: 12/29/2024  Last Visit: 09/21/2024  IK:Lwipqqzmzwupjuzi connective tissue disease   Current Dose per office note 09/21/2024: Plaquenil  200 mg 1 tablet by mouth twice daily.   Contacted [facility] today and spoke to Ansley. Worth will have the PLQ paper filled out and faxed to us  this week.   Okay to refill Plaquenil ?

## 2024-12-25 ENCOUNTER — Other Ambulatory Visit: Payer: Self-pay

## 2024-12-25 DIAGNOSIS — M359 Systemic involvement of connective tissue, unspecified: Secondary | ICD-10-CM

## 2024-12-25 DIAGNOSIS — Z79899 Other long term (current) drug therapy: Secondary | ICD-10-CM

## 2024-12-27 ENCOUNTER — Ambulatory Visit: Payer: Self-pay | Admitting: Physician Assistant

## 2024-12-27 NOTE — Progress Notes (Signed)
 CMP stable.  CBC WNL ESR WNL Complements WNL Urine protein creatinine ratio WNL

## 2024-12-28 NOTE — Progress Notes (Signed)
 Plan to discuss results in more detail at OV tomorrow.

## 2024-12-29 ENCOUNTER — Ambulatory Visit: Attending: Physician Assistant | Admitting: Physician Assistant

## 2024-12-29 ENCOUNTER — Encounter: Payer: Self-pay | Admitting: Physician Assistant

## 2024-12-29 ENCOUNTER — Telehealth: Payer: Self-pay

## 2024-12-29 VITALS — BP 124/87 | HR 88 | Temp 98.0°F | Resp 14 | Ht 69.0 in | Wt 258.6 lb

## 2024-12-29 DIAGNOSIS — E782 Mixed hyperlipidemia: Secondary | ICD-10-CM | POA: Diagnosis not present

## 2024-12-29 DIAGNOSIS — G8929 Other chronic pain: Secondary | ICD-10-CM

## 2024-12-29 DIAGNOSIS — Z8269 Family history of other diseases of the musculoskeletal system and connective tissue: Secondary | ICD-10-CM

## 2024-12-29 DIAGNOSIS — M25561 Pain in right knee: Secondary | ICD-10-CM | POA: Diagnosis not present

## 2024-12-29 DIAGNOSIS — D6851 Activated protein C resistance: Secondary | ICD-10-CM | POA: Diagnosis not present

## 2024-12-29 DIAGNOSIS — Z79899 Other long term (current) drug therapy: Secondary | ICD-10-CM

## 2024-12-29 DIAGNOSIS — M19071 Primary osteoarthritis, right ankle and foot: Secondary | ICD-10-CM | POA: Diagnosis not present

## 2024-12-29 DIAGNOSIS — I10 Essential (primary) hypertension: Secondary | ICD-10-CM

## 2024-12-29 DIAGNOSIS — M19042 Primary osteoarthritis, left hand: Secondary | ICD-10-CM

## 2024-12-29 DIAGNOSIS — Z8719 Personal history of other diseases of the digestive system: Secondary | ICD-10-CM

## 2024-12-29 DIAGNOSIS — J3089 Other allergic rhinitis: Secondary | ICD-10-CM

## 2024-12-29 DIAGNOSIS — R5383 Other fatigue: Secondary | ICD-10-CM

## 2024-12-29 DIAGNOSIS — Z86718 Personal history of other venous thrombosis and embolism: Secondary | ICD-10-CM

## 2024-12-29 DIAGNOSIS — M797 Fibromyalgia: Secondary | ICD-10-CM | POA: Diagnosis not present

## 2024-12-29 DIAGNOSIS — M359 Systemic involvement of connective tissue, unspecified: Secondary | ICD-10-CM | POA: Diagnosis not present

## 2024-12-29 DIAGNOSIS — M19041 Primary osteoarthritis, right hand: Secondary | ICD-10-CM | POA: Diagnosis not present

## 2024-12-29 DIAGNOSIS — G4709 Other insomnia: Secondary | ICD-10-CM

## 2024-12-29 DIAGNOSIS — J4520 Mild intermittent asthma, uncomplicated: Secondary | ICD-10-CM

## 2024-12-29 DIAGNOSIS — M19072 Primary osteoarthritis, left ankle and foot: Secondary | ICD-10-CM

## 2024-12-29 LAB — ANTI-NUCLEAR AB-TITER (ANA TITER): ANA Titer 1: 1:160 {titer} — ABNORMAL HIGH

## 2024-12-29 LAB — CBC WITH DIFFERENTIAL/PLATELET
Absolute Lymphocytes: 945 {cells}/uL (ref 850–3900)
Absolute Monocytes: 466 {cells}/uL (ref 200–950)
Basophils Absolute: 38 {cells}/uL (ref 0–200)
Basophils Relative: 0.6 %
Eosinophils Absolute: 101 {cells}/uL (ref 15–500)
Eosinophils Relative: 1.6 %
HCT: 44 % (ref 35.9–46.0)
Hemoglobin: 14.2 g/dL (ref 11.7–15.5)
MCH: 29.3 pg (ref 27.0–33.0)
MCHC: 32.3 g/dL (ref 31.6–35.4)
MCV: 90.9 fL (ref 81.4–101.7)
MPV: 10.3 fL (ref 7.5–12.5)
Monocytes Relative: 7.4 %
Neutro Abs: 4750 {cells}/uL (ref 1500–7800)
Neutrophils Relative %: 75.4 %
Platelets: 231 Thousand/uL (ref 140–400)
RBC: 4.84 Million/uL (ref 3.80–5.10)
RDW: 12.5 % (ref 11.0–15.0)
Total Lymphocyte: 15 %
WBC: 6.3 Thousand/uL (ref 3.8–10.8)

## 2024-12-29 LAB — COMPREHENSIVE METABOLIC PANEL WITH GFR
AG Ratio: 2.7 (calc) — ABNORMAL HIGH (ref 1.0–2.5)
ALT: 16 U/L (ref 6–29)
AST: 14 U/L (ref 10–35)
Albumin: 4.8 g/dL (ref 3.6–5.1)
Alkaline phosphatase (APISO): 86 U/L (ref 31–125)
BUN: 16 mg/dL (ref 7–25)
CO2: 30 mmol/L (ref 20–32)
Calcium: 9.8 mg/dL (ref 8.6–10.2)
Chloride: 103 mmol/L (ref 98–110)
Creat: 0.81 mg/dL (ref 0.50–0.99)
Globulin: 1.8 g/dL — ABNORMAL LOW (ref 1.9–3.7)
Glucose, Bld: 77 mg/dL (ref 65–99)
Potassium: 4.1 mmol/L (ref 3.5–5.3)
Sodium: 139 mmol/L (ref 135–146)
Total Bilirubin: 0.5 mg/dL (ref 0.2–1.2)
Total Protein: 6.6 g/dL (ref 6.1–8.1)
eGFR: 89 mL/min/1.73m2

## 2024-12-29 LAB — ANA: Anti Nuclear Antibody (ANA): POSITIVE — AB

## 2024-12-29 LAB — PROTEIN / CREATININE RATIO, URINE
Creatinine, Urine: 94 mg/dL (ref 20–275)
Protein/Creat Ratio: 53 mg/g{creat} (ref 24–184)
Protein/Creatinine Ratio: 0.053 mg/mg{creat} (ref 0.024–0.184)
Total Protein, Urine: 5 mg/dL (ref 5–24)

## 2024-12-29 LAB — SEDIMENTATION RATE: Sed Rate: 6 mm/h (ref 0–20)

## 2024-12-29 LAB — C3 AND C4
C3 Complement: 146 mg/dL (ref 83–193)
C4 Complement: 35 mg/dL (ref 15–57)

## 2024-12-29 LAB — ANTI-DNA ANTIBODY, DOUBLE-STRANDED: ds DNA Ab: <1 [IU]/mL

## 2024-12-29 MED ORDER — GABAPENTIN 100 MG PO CAPS: 100.0000 mg | ORAL_CAPSULE | Freq: Every day | ORAL | 0 refills | Status: AC

## 2024-12-29 NOTE — Telephone Encounter (Signed)
-----   Message from Waddell CHRISTELLA Craze sent at 12/29/2024 11:43 AM EST ----- Please call AGAIN to obtain plaquenil  eye exam.  Thank you!

## 2024-12-29 NOTE — Telephone Encounter (Signed)
 Contacted [facility] and spoke with [facility] again, [facility] is having the doctor fill out the paper and will fax it over tomorrow, they are open on Saturdays and the Doctor will be in.

## 2024-12-29 NOTE — Patient Instructions (Signed)
 Gabapentin  Capsules or Tablets What is this medication? GABAPENTIN  (GAB a PEN tin) treats nerve pain. It may also be used to prevent and control seizures in people with epilepsy. It works by calming overactive nerves in your body. This medicine may be used for other purposes; ask your health care provider or pharmacist if you have questions. COMMON BRAND NAME(S): Active-PAC with Gabapentin , Gabarone , Gralise , Neurontin  What should I tell my care team before I take this medication? They need to know if you have any of these conditions: Have or have had depression Kidney disease Lung or breathing disease, such as asthma or COPD Substance use disorder Suicidal thoughts, plans, or attempt An unusual or allergic reaction to gabapentin , other medications, foods, dyes, or preservatives Pregnant or trying to get pregnant Breastfeeding How should I use this medication? Take this medication by mouth with a glass of water. Follow the directions on the prescription label. You can take it with or without food. If it upsets your stomach, take it with food. Take your medication at regular intervals. Do not take it more often than directed. Do not stop taking except on your care team's advice. If you are directed to break the 600 or 800 mg tablets in half as part of your dose, the extra half tablet should be used for the next dose. If you have not used the extra half tablet within 28 days, it should be thrown away. A special MedGuide will be given to you by the pharmacist with each prescription and refill. Be sure to read this information carefully each time. Talk to your care team about the use of this medication in children. While this medication may be prescribed for children as young as 3 years for selected conditions, precautions do apply. Overdosage: If you think you have taken too much of this medicine contact a poison control center or emergency room at once. NOTE: This medicine is only for you. Do not  share this medicine with others. What if I miss a dose? If you miss a dose, take it as soon as you can. If it is almost time for your next dose, take only that dose. Do not take double or extra doses. What may interact with this medication? Alcohol Antihistamines for allergy , cough, and cold Certain medications for anxiety or sleep Certain medications for depression like amitriptyline, fluoxetine, sertraline Certain medications for seizures like phenobarbital, primidone Certain medications for stomach problems General anesthetics like halothane, isoflurane, methoxyflurane, propofol  Local anesthetics like lidocaine , pramoxine, tetracaine Medications that relax muscles for surgery Opioid medications for pain Phenothiazines like chlorpromazine, mesoridazine, prochlorperazine, thioridazine This list may not describe all possible interactions. Give your health care provider a list of all the medicines, herbs, non-prescription drugs, or dietary supplements you use. Also tell them if you smoke, drink alcohol, or use illegal drugs. Some items may interact with your medicine. What should I watch for while using this medication? Visit your care team for regular checks on your progress. Tell your care team if your symptoms do not start to get better or if they get worse. Do not suddenly stop taking this medication. You may develop a severe reaction. Your care team will tell you how much medication to take. If your care team wants you to stop the medication, the dose may be slowly lowered over time to avoid any side effects. This medication may affect your coordination, reaction time, or judgment. Do not drive or operate machinery until you know how this medication affects you. Sit  up or stand slowly to reduce the risk of dizzy or fainting spells. Drinking alcohol with this medication can increase the risk of these side effects. Taking this medication with other CNS depressants can make you too sleepy. This  can make it hard to breathe and stay awake. In some cases, it can cause coma and death. CNS depressants include opioids, benzodiazepines, muscle relaxants, medications for sleep, and alcohol. Talk to your care team about all the medications, vitamins, and supplements you take. They can tell you what is safe to take together. Call emergency services right away if you have slow or shallow breathing, feel dizzy or confused, or have trouble staying awake. This medication can cause new or worsening depression and increase the risk of suicidal thoughts and actions in a small number of people. This can happen while you are taking this medication or after stopping it. Talk to your care team right away if you have changes in mood or behavior or thoughts of self-harm or suicide. They can help you. This medication may cause serious skin reactions. They can happen weeks to months after starting the medication. Talk to your care team right away if you have fevers or flu-like symptoms with a rash. The rash may be red or purple and then turn into blisters or peeling of the skin. Or you might notice a red rash with swelling of the face, lips, or lymph nodes in your neck or under your arms. If you take this medication for seizures, wear a medical ID bracelet or chain. Carry a card that describes your condition. List the medications and doses you take on the card. Talk to your care team if you may be pregnant. There are benefits and risks to taking medications during pregnancy. Your care team can help you find the option that works for you. What side effects may I notice from receiving this medication? Side effects that you should report to your care team as soon as possible: Allergic reactions or angioedema--skin rash, itching or hives, swelling of the face, eyes, lips, tongue, arms, or legs, trouble swallowing or breathing Rash, fever, and swollen lymph nodes Thoughts of suicide or self-harm, worsening mood, feelings of  depression Trouble breathing Unusual changes in mood or behavior in children after use such as trouble concentrating, hostility, or restlessness Side effects that usually do not require medical attention (report these to your care team if they continue or are bothersome): Dizziness Drowsiness Nausea Swelling of the ankles, hands, or feet Vomiting This list may not describe all possible side effects. Call your doctor for medical advice about side effects. You may report side effects to FDA at 1-800-FDA-1088. Where should I keep my medication? Keep out of reach of children and pets. Store at room temperature between 15 and 30 degrees C (59 and 86 degrees F). Get rid of any unused medication after the expiration date. This medication may cause accidental overdose and death if taken by other adults, children, or pets. To get rid of medications that are no longer needed or have expired: Take the medication to a medication take-back program. Check with your pharmacy or law enforcement to find a location. If you cannot return the medication, check the label or package insert to see if the medication should be thrown out in the garbage or flushed down the toilet. If you are not sure, ask your care team. If it is safe to put it in the trash, empty the medication out of the container. Mix the medication  with cat litter, dirt, coffee grounds, or other unwanted substance. Seal the mixture in a bag or container. Put it in the trash. NOTE: This sheet is a summary. It may not cover all possible information. If you have questions about this medicine, talk to your doctor, pharmacist, or health care provider.  2025 [employer]/Gold Standard (2024-04-10 00:00:00)

## 2025-01-01 NOTE — Progress Notes (Signed)
 ANA remains positive-titer and pattern unchanged.   dsDNA is negative

## 2025-01-03 NOTE — Progress Notes (Signed)
 ANA does not correlate with disease activity.  Occasionally the ANA titer will reduce over time or go negative.  ANA will usually only be checked on a yearly basis while on plaquenil .  The main things we are monitoring for are that the complements, sed rate, double-stranded DNA remain within a normal range because these values do correlate with disease activity.

## 2025-04-06 ENCOUNTER — Ambulatory Visit: Admitting: Physician Assistant
